# Patient Record
Sex: Male | Born: 1950
Health system: Southern US, Community
[De-identification: ages and names within clinical notes are randomized; demographics above are authoritative.]

## PROBLEM LIST (undated history)

## (undated) DIAGNOSIS — B1920 Unspecified viral hepatitis C without hepatic coma: Secondary | ICD-10-CM

## (undated) DIAGNOSIS — C801 Malignant (primary) neoplasm, unspecified: Secondary | ICD-10-CM

## (undated) DIAGNOSIS — F1020 Alcohol dependence, uncomplicated: Secondary | ICD-10-CM

## (undated) DIAGNOSIS — I10 Essential (primary) hypertension: Secondary | ICD-10-CM

## (undated) DIAGNOSIS — M7540 Impingement syndrome of unspecified shoulder: Secondary | ICD-10-CM

## (undated) HISTORY — PX: STOMACH SURGERY: SHX791

## (undated) HISTORY — DX: Essential (primary) hypertension: I10

## (undated) HISTORY — DX: Unspecified viral hepatitis C without hepatic coma: B19.20

## (undated) HISTORY — DX: Alcohol dependence, uncomplicated: F10.20

## (undated) HISTORY — PX: FRACTURE SURGERY: SHX138

---

## 2002-06-06 DIAGNOSIS — M25819 Other specified joint disorders, unspecified shoulder: Secondary | ICD-10-CM

## 2002-06-06 DIAGNOSIS — M7540 Impingement syndrome of unspecified shoulder: Secondary | ICD-10-CM

## 2002-06-06 HISTORY — DX: Other specified joint disorders, unspecified shoulder: M25.819

## 2002-06-06 HISTORY — DX: Impingement syndrome of unspecified shoulder: M75.40

## 2003-03-31 ENCOUNTER — Encounter: Payer: Self-pay | Admitting: Emergency Medicine

## 2003-03-31 ENCOUNTER — Emergency Department (HOSPITAL_COMMUNITY): Admission: EM | Admit: 2003-03-31 | Discharge: 2003-03-31 | Payer: Self-pay | Admitting: Emergency Medicine

## 2004-02-05 ENCOUNTER — Inpatient Hospital Stay (HOSPITAL_COMMUNITY): Admission: EM | Admit: 2004-02-05 | Discharge: 2004-02-11 | Payer: Self-pay | Admitting: Emergency Medicine

## 2004-10-21 ENCOUNTER — Ambulatory Visit (HOSPITAL_COMMUNITY): Admission: RE | Admit: 2004-10-21 | Discharge: 2004-10-21 | Payer: Self-pay | Admitting: Orthopedic Surgery

## 2004-10-21 ENCOUNTER — Ambulatory Visit (HOSPITAL_BASED_OUTPATIENT_CLINIC_OR_DEPARTMENT_OTHER): Admission: RE | Admit: 2004-10-21 | Discharge: 2004-10-22 | Payer: Self-pay | Admitting: Orthopedic Surgery

## 2005-11-10 ENCOUNTER — Emergency Department (HOSPITAL_COMMUNITY): Admission: EM | Admit: 2005-11-10 | Discharge: 2005-11-10 | Payer: Self-pay | Admitting: Emergency Medicine

## 2010-04-03 ENCOUNTER — Emergency Department (HOSPITAL_COMMUNITY): Admission: EM | Admit: 2010-04-03 | Discharge: 2010-04-03 | Payer: Self-pay | Admitting: Emergency Medicine

## 2010-10-22 NOTE — Op Note (Signed)
Steven [name], Steven [name]                          ACCOUNT NO.:  [id number]   MEDICAL RECORD NO.:  [id number]                   PATIENT TYPE:  INP   LOCATION:  5038                                 FACILITY:  [facility]   PHYSICIAN:  Katy Fitch. Naaman Plummer., M.D.          DATE OF BIRTH:  10/18/1950   DATE OF PROCEDURE:  02/09/2004  DATE OF DISCHARGE:                                 OPERATIVE REPORT   PREOPERATIVE DIAGNOSIS:  Comminuted fracture of the left distal radius,  sustained an 8-feet fall off a roof on February 05, 2004.   INDICATIONS:  Steven [name] was admitted to [facility] emergency room by Dr. Carolynne Edouard  of the trauma service noting a comminuted fracture of the left distal radius  and a chest film revealing fractures of the left eighth and ninth ribs.  He  had considerable chest wall pain and underwent CT scanning of his abdomen.  Imaging studies were negative for significant internal injury.   Steven [name] was admitted to the trauma service and was noted to be febrile  for several days  he has been treated with appropriate pulmonary toilet and  hydration.  He has been afebrile for the past 24 hours and, therefore, we  proceed with elective open reduction and internal fixation of his left  distal radius at this time.   DESCRIPTION OF PROCEDURE:  Steven [name] was brought to the operating room  and placed in the supine position on the operating room table.  Following  anesthesia consultation by Dr. Michelle Piper, general orotracheal anesthesia was  induced.  The left arm was prepped with Betadine solution and sterilely  draped.  A pneumatic tourniquet was applied to the proximal brachium.  Ancef  1 g was administered for IV prophylactic antibiotic followed by  exsanguination of limb with Esmarch with inflation of the arterial  tourniquet to 240 mmHg.  Procedure commenced with a standard DVR incision  parallel to the flexor carpi radialis.  Subcutaneous tissue was carefully  divided taking care to  suture ligate in several transverse veins.  The  fascia overlying the flexor carpi radialis was incised longitudinally  followed by release of the fascia deep to flexor carpi radialis.  Flexor  pollicis longus was retracted in an ulnar direction and pronator quadratus  isolated.   The fracture site was quite distal.  A portion of the volar wrist ligaments  was dissected and the brachial radialis was released deep to the first  dorsal compartment.   A Lane bone clamp was used to rotate the fracture appropriately 90 degrees,  proximal shaft into pronation allowing use of a Freer to disimpact the  dorsal fracture fragments and reduce the articular portion of the fracture.  The fracture was then reduced with three-point molding to an anatomic  position.   A 9-peg DVR plate was placed with provisional fixation with a sliding screw.  This was advanced distally to allow proper placement of the  pegs deep to the  articular surface.   The articular surface was realigned anatomically achieving an ulnar minus  appearance of the wrist suggesting that complete anatomic restoration of  length was achieved.   The distal pegs were replaced in a standard manner utilizing threaded pegs  on the radial aspect of the fracture capturing the radial styloid and smooth  pegs directly below the fourth dorsal compartment.   The AP and lateral and oblique C-arm images were used to control PEG  placement.  The remaining cortical screws were placed in the shaft of the  radius.   The wound was then thoroughly lavaged with sterile saline followed by repair  of the pronator quadratus with mattress suture of 0 Vicryl and repair of the  skin with subdermal sutures of 3-0 Vicryl, 4-0 Vicryl and intradermal 3-0  Prolene.  Marcaine 0.25% was infiltrated for postoperative analgesia  followed by application of a dressing with Betadine ointment, Xeroform,  sterile gauze, sterile Webril, and a sugar-tong splint  maintaining the  forearm in slight pronation.  There were no apparent complications.   Steven [name] was awakened from anesthesia and transferred to the recovery  room with stable vital signs.  The tourniquet time was 60 minutes at 240  mmHg.                                               Katy Fitch Naaman Plummer., M.D.    RVS/MEDQ  D:  02/09/2004  T:  02/09/2004  Job:  [id number]   cc:   Ollen Gross. Vernell Morgans, M.D.  [address]  [place]  [place] [address]  Fax: [phone]

## 2010-10-22 NOTE — Discharge Summary (Signed)
NAMELUCILE, DIDONATO                          ACCOUNT NO.:  [id number]   MEDICAL RECORD NO.:  [id number]                   PATIENT TYPE:  INP   LOCATION:  5038                                 FACILITY:  [facility]   PHYSICIAN:  Jimmye Norman, M.D.                   DATE OF BIRTH:  07-30-1950   DATE OF ADMISSION:  02/04/2004  DATE OF DISCHARGE:  02/11/2004                                 DISCHARGE SUMMARY   ADMITTING TRAUMA SURGEON:  Ollen Gross. Carolynne Edouard, M.D.   CONSULTATIONS:  Dr. Katy Fitch. Sypher, hand surgery.   DISCHARGE DIAGNOSES:  1.  Fall from a roof approximately eight feet.  2.  Left rib fractures, left lateral ribs number eight and nine.  3.  Comminuted fracture, left distal radius.  4.  History of ETOH use.  5.  History of tobacco abuse.   HISTORY OF PRESENT ILLNESS:  This is a 60 year old Caucasian male who fell  approximately eight feet from a roof.  He suffered left rib fractures x2,  and a comminuted left distal radius fracture.  He had no other injuries.  He  was admitted for pain control and nebulization.   HOSPITAL COURSE:  He underwent an evaluation by hand surgery for his distal  radius fracture, and it was felt that surgical intervention was required,  and the patient underwent an ORIF of his left comminuted distal radius  fracture utilizing a DVR plate per Dr. Teressa Senter without complications.  This  was a closed fracture.   DISPOSITION:  The patient is stable upon discharge and went home the  following day.   DISCHARGE MEDICATIONS:  1.  Motrin 600 mg, one p.o. q.6h. p.r.n. pain.  2.  Dilaudid 2 mg, one to two p.o. q.4h. p.r.n. pain.   FOLLOWUP:  The patient will follow up with Dr. Teressa Senter next Monday on  February 16, 2004.  He will follow up with the trauma service on February 17, 2004, or sooner should he have difficulties in the interim.      Shawn Rayburn, P.A.                       Jimmye Norman, M.D.    SR/MEDQ  D:  02/11/2004  T:  02/11/2004  Job:  [id number]

## 2010-10-22 NOTE — Op Note (Signed)
NAMEJOMAR, [name]             ACCOUNT NO.:  [id number]   MEDICAL RECORD NO.:  [id number]          PATIENT TYPE:  AMB   LOCATION:  DSC                          FACILITY:  [facility]   PHYSICIAN:  Katy Fitch. Sypher, M.D. DATE OF BIRTH:  March 27, 1941   DATE OF PROCEDURE:  10/21/2004  DATE OF DISCHARGE:                                 OPERATIVE REPORT   PREOPERATIVE DIAGNOSES:  1.  Chronic left shoulder AC degenerative arthritis with stage II      impingement.  2.  Rule out partial or full-thickness rotator cuff tear.   POSTOP DIAGNOSIS:  Mild adhesive capsulitis left shoulder with AC  degenerative arthritis and chronic stage II impingement with bursitis and  significant labral degenerative changes from 2 o'clock anteriorly to 10  o'clock posteriorly.   OPERATION:  1.  Diagnostic arthroscopy, left glenohumeral joint with labral debridement.  2.  Arthroscopic subacromial decompression with acromioplasty,      coracoacromial ligament release and bursectomy.  3.  Open distal clavicle resection.   OPERATING SURGEON:  Katy Fitch. Sypher, M.D.   ASSISTANT:  Molly Maduro Dasnoit PA-C.   ANESTHESIA:  General by endotracheal technique supplemented by a left  interscalene block.   SUPERVISING ANESTHESIOLOGIST:  Dr. Michelle Piper.   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 He sustained a left chest contusion, a left  shoulder injury and a fracture of the left wrist.   He is status post open reduction internal fixation of his wrist fracture. He  subsequently went on to heal his wrist fracture but had persistent  difficulties with left shoulder pain. Clinical examination suggested he had  probably sustained a partial separation of his left shoulder and subsequent  AC degenerative arthritis changes and signs of impingement.   Due to failure to respond to more than eight months of observation. He is  now brought to the operating room this  time after an MRI documented AC  arthritis and signs of chronic subacromial bursitis and rotator cuff  tendinopathy.   Preoperatively he was advised we anticipated subacromial decompression and  distal clavicle resection. We advised him we would repair his rotator cuff  should we find significant pathology.   PROCEDURE:  [name] is brought to the operating room and placed in  supine position upon the operating table.   Following the induction of general anesthesia, he was carefully positioned  in the beach-chair position with the of a torso and head holder designed for  shoulder arthroscopy.   The entire left upper extremity and forequarter were prepped were prepped  with DuraPrep and draped with impervious arthroscopy drapes.   The shoulder was distended with 20 cc of sterile saline through an anterior  approach with a spinal needle followed by placement of the scope through a  standard posterior viewing portal.   Diagnostic arthroscopy revealed intact hyaline articular cartilage surfaces  on the glenoid and humeral head. The superior and inferior recesses were  normal. The biceps origin was stable; however, the superior labrum was quite  degenerative and frayed with  a type 1 SLAP lesion identified.   An anterior portal was created under direct vision followed by use of a 4.5-  mm suction shaver to thoroughly debride the labrum. The anterior capsular  ligaments were obscured partially by some adhesive capsulitis tissues. This  was debrided with a suction shaver and hemostasis achieved with ArthroCare  Turbovac wand.   The deep surface rotator cuff was inspected, found be normal.   There no signs of instability.   The scope was removed from the glenohumeral joint and placed in the  subacromial space. A florid bursitis was noted.   After bursectomy, the coracoacromial ligament released with the cutting  cautery followed by release the capsule of AC joint. The [facility] joint  revealed  marked degenerative changes on the distal clavicle.   The acromion was leveled to a type 1 morphology. After thorough bursectomy,  the cuff was inspected and found be intact.   The distal clavicle was then not mobile enough to facilitate easy  arthroscopic removal. Therefore we proceeded to open resection of distal  clavicle with a 2 cm incision centered over the distal clavicle followed by  subperiosteal dissection of the distal clavicle with release of the  ligaments at the [facility] joint. The distal 15 mm clavicle was removed with the  oscillating saw and osteotome and rongeur. The dead space created by distal  clavicle resection was repaired with mattress suture of #2 FiberWire x2.   The wound was then repaired with subdermal sutures of 3-0 Vicryl and  intradermal 3-0 Prolene.   The scope was then placed in the subacromial space and hemostasis achieved  followed by photographic documentation of the distal clavicle resection.   There were no apparent complications.   Mr. Ostrow tolerated surgery and anesthesia well. He was transferred to  recovery room with stable signs.   We will admit him to recovery care center for observation of his vital signs  and prophylactic antibiotic in the form of Ancef 1 gram IV q.8 h. He will be  discharged 24 hours. We anticipate use of Dilaudid 2 milligrams 1 or 2  tablets p.o.  q.4 to 6 hours p.r.n. pain, 30 tablets with no refills, Motrin  6 milligrams one p.o. q.6 h p.r.n. pain 30 tablets with one refill and  Keflex 500 milligrams one p.o. q.8 h x 4 days as a prophylactic antibiotic.      RVS/MEDQ  D:  10/21/2004  T:  10/21/2004  Job:  [id number]

## 2011-06-07 HISTORY — PX: SKIN CANCER EXCISION: SHX779

## 2012-06-05 ENCOUNTER — Telehealth: Payer: Self-pay

## 2012-06-05 NOTE — Telephone Encounter (Signed)
   Gastroenterology Pre-Procedure Form  Pt referred by Dr. Felecia Shelling not sure if he had colonoscopy years ago in [place]/ not having any problems and not on any meds   Request Date: 06/05/2012  ,  Requesting Physician: Dr. Felecia Shelling     PATIENT INFORMATION:  Steven [name] is a 61 y.o., male (DOB=[birth year]).  PROCEDURE: Procedure(s) requested: colonoscopy Procedure Reason: screening for colon cancer  PATIENT REVIEW QUESTIONS: The patient reports the following:   1. Diabetes Melitis: no 2. Joint replacements in the past 12 months: no 3. Major health problems in the past 3 months: no 4. Has an artificial valve or MVP:no 5. Has been advised in past to take antibiotics in advance of a procedure like teeth cleaning: no}    MEDICATIONS & ALLERGIES:    Patient reports the following regarding taking any blood thinners:   Plavix? no Aspirin?no Coumadin?  no  Patient confirms/reports the following medications:  No current outpatient prescriptions on file.    Patient confirms/reports the following allergies:  No Known Allergies  Patient is appropriate to schedule for requested procedure(s): yes  AUTHORIZATION INFORMATION Primary Insurance:   ID #:  Group #:  Pre-Cert / Auth required: Pre-Cert / Auth #:   Secondary Insurance:   ID #:   Group #:  Pre-Cert / Auth required:  Pre-Cert / Auth #:   No orders of the defined types were placed in this encounter.    SCHEDULE INFORMATION: Procedure has been scheduled as follows:  Date: 06/22/2012    Time: 8:30 AM  Location: [facility]  This Gastroenterology Pre-Precedure Form is being routed to the following provider(s) for review: Jonette Eva, MD

## 2012-06-07 MED ORDER — PEG-KCL-NACL-NASULF-NA ASC-C 100 G PO SOLR: 1.0000 | ORAL | Status: DC

## 2012-06-07 NOTE — Telephone Encounter (Signed)
Rx sent to [facility] in [place]. Instructions mailed to pt.  

## 2012-06-07 NOTE — Telephone Encounter (Signed)
MOVI PREP SPLIT DOSING, REGULAR BREAKFAST. CLEAR LIQUIDS AFTER 9 AM.  

## 2012-06-21 ENCOUNTER — Telehealth: Payer: Self-pay

## 2012-06-21 NOTE — Telephone Encounter (Signed)
REVIEWED.  

## 2012-06-21 NOTE — Telephone Encounter (Addendum)
Pt called to cancel colonoscopy tomorrow. York Spaniel he has the flu. He wants to wait a couple of months and he also wants to make sure that Medicaid will pay for it. I took him off of our schedule. But looks like I had failed to put the order in, so I did not have to cancel orders. I will have him on my reminder list to check on in a couple of months.

## 2012-10-17 ENCOUNTER — Telehealth: Payer: Self-pay

## 2012-10-17 NOTE — Telephone Encounter (Signed)
Spoke to pt and he said he does want to schedule soon. He is busy at the moment and will call when he is ready.

## 2012-11-02 ENCOUNTER — Other Ambulatory Visit: Payer: Self-pay | Admitting: Radiology

## 2012-11-05 ENCOUNTER — Encounter (HOSPITAL_COMMUNITY): Payer: Self-pay | Admitting: Pharmacy Technician

## 2012-11-16 ENCOUNTER — Encounter (HOSPITAL_COMMUNITY): Payer: Self-pay

## 2012-11-16 ENCOUNTER — Other Ambulatory Visit: Payer: Self-pay

## 2012-11-16 ENCOUNTER — Encounter (HOSPITAL_COMMUNITY)
Admission: RE | Admit: 2012-11-16 | Discharge: 2012-11-16 | Disposition: A | Discharge status: Home / Self Care | Payer: Medicare Other | Source: Ambulatory Visit | Attending: Orthopaedic Surgery | Admitting: Orthopaedic Surgery

## 2012-11-16 HISTORY — DX: Impingement syndrome of unspecified shoulder: M75.40

## 2012-11-16 LAB — URINE MICROSCOPIC-ADD ON

## 2012-11-16 LAB — CBC WITH DIFFERENTIAL/PLATELET
Basophils Absolute: 0.1 10*3/uL (ref 0.0–0.1)
Basophils Relative: 1 % (ref 0–1)
Eosinophils Absolute: 0.2 10*3/uL (ref 0.0–0.7)
Eosinophils Relative: 3 % (ref 0–5)
HCT: 50.8 % (ref 39.0–52.0)
Hemoglobin: 17.7 g/dL — ABNORMAL HIGH (ref 13.0–17.0)
Lymphocytes Relative: 20 % (ref 12–46)
Lymphs Abs: 1.7 10*3/uL (ref 0.7–4.0)
MCH: 33.1 pg (ref 26.0–34.0)
MCHC: 34.8 g/dL (ref 30.0–36.0)
MCV: 95 fL (ref 78.0–100.0)
Monocytes Absolute: 0.6 10*3/uL (ref 0.1–1.0)
Monocytes Relative: 7 % (ref 3–12)
Neutro Abs: 6.3 10*3/uL (ref 1.7–7.7)
Neutrophils Relative %: 70 % (ref 43–77)
Platelets: 183 10*3/uL (ref 150–400)
RBC: 5.35 MIL/uL (ref 4.22–5.81)
RDW: 13.1 % (ref 11.5–15.5)
WBC: 8.9 10*3/uL (ref 4.0–10.5)

## 2012-11-16 LAB — COMPREHENSIVE METABOLIC PANEL
ALT: 45 U/L (ref 0–53)
AST: 28 U/L (ref 0–37)
Albumin: 4 g/dL (ref 3.5–5.2)
Alkaline Phosphatase: 81 U/L (ref 39–117)
BUN: 14 mg/dL (ref 6–23)
CO2: 28 mEq/L (ref 19–32)
Calcium: 9.6 mg/dL (ref 8.4–10.5)
Chloride: 99 mEq/L (ref 96–112)
Creatinine, Ser: 0.73 mg/dL (ref 0.50–1.35)
GFR calc Af Amer: >90 mL/min (ref >90)
GFR calc non Af Amer: >90 mL/min (ref >90)
Glucose, Bld: 174 mg/dL — ABNORMAL HIGH (ref 70–99)
Potassium: 4.5 mEq/L (ref 3.5–5.1)
Sodium: 137 mEq/L (ref 135–145)
Total Bilirubin: 0.6 mg/dL (ref 0.3–1.2)
Total Protein: 7.4 g/dL (ref 6.0–8.3)

## 2012-11-16 LAB — URINALYSIS, ROUTINE W REFLEX MICROSCOPIC
Bilirubin Urine: NEGATIVE
Glucose, UA: NEGATIVE mg/dL
Ketones, ur: NEGATIVE mg/dL
Leukocytes, UA: NEGATIVE
Nitrite: NEGATIVE
Protein, ur: NEGATIVE mg/dL
Specific Gravity, Urine: 1.02 (ref 1.005–1.030)
Urobilinogen, UA: 1 mg/dL (ref 0.0–1.0)
pH: 6 (ref 5.0–8.0)

## 2012-11-16 LAB — RAPID URINE DRUG SCREEN, HOSP PERFORMED
Amphetamines: NOT DETECTED
Barbiturates: NOT DETECTED
Benzodiazepines: NOT DETECTED
Cocaine: NOT DETECTED
Opiates: POSITIVE — AB
Tetrahydrocannabinol: POSITIVE — AB

## 2012-11-16 LAB — SURGICAL PCR SCREEN
MRSA, PCR: NEGATIVE
Staphylococcus aureus: NEGATIVE

## 2012-11-16 NOTE — Patient Instructions (Signed)
Steven [name]  11/16/2012   Your procedure is scheduled on:  11/20/12  Report to [facility] at [place] [facility].  Call this number if you have problems the morning of surgery: [phone]   Remember:   Do not eat food or drink liquids after midnight.   Take these medicines the morning of surgery with A SIP OF WATER: vicodin   Do not wear jewelry, make-up or nail polish.  Do not wear lotions, powders, or perfumes. You may wear deodorant.  Do not shave 48 hours prior to surgery. Men may shave face and neck.  Do not bring valuables to the hospital.  [facility] is not responsible                   for any belongings or valuables.  Contacts, dentures or bridgework may not be worn into surgery.  Leave suitcase in the car. After surgery it may be brought to your room.  For patients admitted to the hospital, checkout time is 11:00 AM the day of  discharge.   Patients discharged the day of surgery will not be allowed to drive  home.  Name and phone number of your driver: family  Special Instructions: Shower using CHG 2 nights before surgery and the night before surgery.  If you shower the day of surgery use CHG.  Use special wash - you have one bottle of CHG for all showers.  You should use approximately 1/3 of the bottle for each shower.   Please read over the following fact sheets that you were given: Pain Booklet, Coughing and Deep Breathing, MRSA Information, Surgical Site Infection Prevention, Anesthesia Post-op Instructions and Care and Recovery After Surgery   PATIENT INSTRUCTIONS POST-ANESTHESIA  IMMEDIATELY FOLLOWING SURGERY:  Do not drive or operate machinery for the first twenty four hours after surgery.  Do not make any important decisions for twenty four hours after surgery or while taking narcotic pain medications or sedatives.  If you develop intractable nausea and vomiting or a severe headache please notify your doctor immediately.  FOLLOW-UP:  Please make an appointment with your  surgeon as instructed. You do not need to follow up with anesthesia unless specifically instructed to do so.  WOUND CARE INSTRUCTIONS (if applicable):  Keep a dry clean dressing on the anesthesia/puncture wound site if there is drainage.  Once the wound has quit draining you may leave it open to air.  Generally you should leave the bandage intact for twenty four hours unless there is drainage.  If the epidural site drains for more than 36-48 hours please call the anesthesia department.  QUESTIONS?:  Please feel free to call your physician or the hospital operator if you have any questions, and they will be happy to assist you.      Impingement Syndrome, Rotator Cuff, Bursitis with Rehab Impingement syndrome is a condition that involves inflammation of the tendons of the rotator cuff and the subacromial bursa, that causes pain in the shoulder. The rotator cuff consists of four tendons and muscles that control much of the shoulder and upper arm function. The subacromial bursa is a fluid filled sac that helps reduce friction between the rotator cuff and one of the bones of the shoulder (acromion). Impingement syndrome is usually an overuse injury that causes swelling of the bursa (bursitis), swelling of the tendon (tendonitis), and/or a tear of the tendon (strain). Strains are classified into three categories. Grade 1 strains cause pain, but the tendon is not lengthened. Grade  2 strains include a lengthened ligament, due to the ligament being stretched or partially ruptured. With grade 2 strains there is still function, although the function may be decreased. Grade 3 strains include a complete tear of the tendon or muscle, and function is usually impaired. SYMPTOMS   Pain around the shoulder, often at the outer portion of the upper arm.  Pain that gets worse with shoulder function, especially when reaching overhead or lifting.  Sometimes, aching when not using the arm.  Pain that wakes you up at  night.  Sometimes, tenderness, swelling, warmth, or redness over the affected area.  Loss of strength.  Limited motion of the shoulder, especially reaching behind the back (to the back pocket or to unhook bra) or across your body.  Crackling sound (crepitation) when moving the arm.  Biceps tendon pain and inflammation (in the front of the shoulder). Worse when bending the elbow or lifting. CAUSES  Impingement syndrome is often an overuse injury, in which chronic (repetitive) motions cause the tendons or bursa to become inflamed. A strain occurs when a force is paced on the tendon or muscle that is greater than it can withstand. Common mechanisms of injury include: Stress from sudden increase in duration, frequency, or intensity of training.  Direct hit (trauma) to the shoulder.  Aging, erosion of the tendon with normal use.  Bony bump on shoulder (acromial spur). RISK INCREASES WITH:  Contact sports (football, wrestling, boxing).  Throwing sports (baseball, tennis, volleyball).  Weightlifting and bodybuilding.  Heavy labor.  Previous injury to the rotator cuff, including impingement.  Poor shoulder strength and flexibility.  Failure to warm up properly before activity.  Inadequate protective equipment.  Old age.  Bony bump on shoulder (acromial spur). PREVENTION   Warm up and stretch properly before activity.  Allow for adequate recovery between workouts.  Maintain physical fitness:  Strength, flexibility, and endurance.  Cardiovascular fitness.  Learn and use proper exercise technique. PROGNOSIS  If treated properly, impingement syndrome usually goes away within 6 weeks. Sometimes surgery is required.  RELATED COMPLICATIONS   Longer healing time if not properly treated, or if not given enough time to heal.  Recurring symptoms, that result in a chronic condition.  Shoulder stiffness, frozen shoulder, or loss of motion.  Rotator cuff tendon  tear.  Recurring symptoms, especially if activity is resumed too soon, with overuse, with a direct blow, or when using poor technique. TREATMENT  Treatment first involves the use of ice and medicine, to reduce pain and inflammation. The use of strengthening and stretching exercises may help reduce pain with activity. These exercises may be performed at home or with a therapist. If non-surgical treatment is unsuccessful after more than 6 months, surgery may be advised. After surgery and rehabilitation, activity is usually possible in 3 months.  MEDICATION  If pain medicine is needed, nonsteroidal anti-inflammatory medicines (aspirin and ibuprofen), or other minor pain relievers (acetaminophen), are often advised.  Do not take pain medicine for 7 days before surgery.  Prescription pain relievers may be given, if your caregiver thinks they are needed. Use only as directed and only as much as you need.  Corticosteroid injections may be given by your caregiver. These injections should be reserved for the most serious cases, because they may only be given a certain number of times. HEAT AND COLD  Cold treatment (icing) should be applied for 10 to 15 minutes every 2 to 3 hours for inflammation and pain, and immediately after activity that aggravates  your symptoms. Use ice packs or an ice massage.  Heat treatment may be used before performing stretching and strengthening activities prescribed by your caregiver, physical therapist, or athletic trainer. Use a heat pack or a warm water soak. SEEK MEDICAL CARE IF:   Symptoms get worse or do not improve in 4 to 6 weeks, despite treatment.  New, unexplained symptoms develop. (Drugs used in treatment may produce side effects.) EXERCISES  RANGE OF MOTION (ROM) AND STRETCHING EXERCISES - Impingement Syndrome (Rotator Cuff  Tendinitis, Bursitis) These exercises may help you when beginning to rehabilitate your injury. Your symptoms may go away with or  without further involvement from your physician, physical therapist or athletic trainer. While completing these exercises, remember:   Restoring tissue flexibility helps normal motion to return to the joints. This allows healthier, less painful movement and activity.  An effective stretch should be held for at least 30 seconds.  A stretch should never be painful. You should only feel a gentle lengthening or release in the stretched tissue. STRETCH  Flexion, Standing  Stand with good posture. With an underhand grip on your right / left hand, and an overhand grip on the opposite hand, grasp a broomstick or cane so that your hands are a little more than shoulder width apart.  Keeping your right / left elbow straight and shoulder muscles relaxed, push the stick with your opposite hand, to raise your right / left arm in front of your body and then overhead. Raise your arm until you feel a stretch in your right / left shoulder, but before you have increased shoulder pain.  Try to avoid shrugging your right / left shoulder as your arm rises, by keeping your shoulder blade tucked down and toward your mid-back spine. Hold for __________ seconds.  Slowly return to the starting position. Repeat __________ times. Complete this exercise __________ times per day. STRETCH  Abduction, Supine  Lie on your back. With an underhand grip on your right / left hand and an overhand grip on the opposite hand, grasp a broomstick or cane so that your hands are a little more than shoulder width apart.  Keeping your right / left elbow straight and your shoulder muscles relaxed, push the stick with your opposite hand, to raise your right / left arm out to the side of your body and then overhead. Raise your arm until you feel a stretch in your right / left shoulder, but before you have increased shoulder pain.  Try to avoid shrugging your right / left shoulder as your arm rises, by keeping your shoulder blade tucked down  and toward your mid-back spine. Hold for __________ seconds.  Slowly return to the starting position. Repeat __________ times. Complete this exercise __________ times per day. ROM  Flexion, Active-Assisted  Lie on your back. You may bend your knees for comfort.  Grasp a broomstick or cane so your hands are about shoulder width apart. Your right / left hand should grip the end of the stick, so that your hand is positioned "thumbs-up," as if you were about to shake hands.  Using your healthy arm to lead, raise your right / left arm overhead, until you feel a gentle stretch in your shoulder. Hold for __________ seconds.  Use the stick to assist in returning your right / left arm to its starting position. Repeat __________ times. Complete this exercise __________ times per day.  ROM - Internal Rotation, Supine   Lie on your back on a firm surface.  Place your right / left elbow about 60 degrees away from your side. Elevate your elbow with a folded towel, so that the elbow and shoulder are the same height.  Using a broomstick or cane and your strong arm, pull your right / left hand toward your body until you feel a gentle stretch, but no increase in your shoulder pain. Keep your shoulder and elbow in place throughout the exercise.  Hold for __________ seconds. Slowly return to the starting position. Repeat __________ times. Complete this exercise __________ times per day. STRETCH - Internal Rotation  Place your right / left hand behind your back, palm up.  Throw a towel or belt over your opposite shoulder. Grasp the towel with your right / left hand.  While keeping an upright posture, gently pull up on the towel, until you feel a stretch in the front of your right / left shoulder.  Avoid shrugging your right / left shoulder as your arm rises, by keeping your shoulder blade tucked down and toward your mid-back spine.  Hold for __________ seconds. Release the stretch, by lowering your healthy  hand. Repeat __________ times. Complete this exercise __________ times per day. ROM - Internal Rotation   Using an underhand grip, grasp a stick behind your back with both hands.  While standing upright with good posture, slide the stick up your back until you feel a mild stretch in the front of your shoulder.  Hold for __________ seconds. Slowly return to your starting position. Repeat __________ times. Complete this exercise __________ times per day.  STRETCH  Posterior Shoulder Capsule   Stand or sit with good posture. Grasp your right / left elbow and draw it across your chest, keeping it at the same height as your shoulder.  Pull your elbow, so your upper arm comes in closer to your chest. Pull until you feel a gentle stretch in the back of your shoulder.  Hold for __________ seconds. Repeat __________ times. Complete this exercise __________ times per day. STRENGTHENING EXERCISES - Impingement Syndrome (Rotator Cuff Tendinitis, Bursitis) These exercises may help you when beginning to rehabilitate your injury. They may resolve your symptoms with or without further involvement from your physician, physical therapist or athletic trainer. While completing these exercises, remember:  Muscles can gain both the endurance and the strength needed for everyday activities through controlled exercises.  Complete these exercises as instructed by your physician, physical therapist or athletic trainer. Increase the resistance and repetitions only as guided.  You may experience muscle soreness or fatigue, but the pain or discomfort you are trying to eliminate should never worsen during these exercises. If this pain does get worse, stop and make sure you are following the directions exactly. If the pain is still present after adjustments, discontinue the exercise until you can discuss the trouble with your clinician.  During your recovery, avoid activity or exercises which involve actions that place  your injured hand or elbow above your head or behind your back or head. These positions stress the tissues which you are trying to heal. STRENGTH - Scapular Depression and Adduction   With good posture, sit on a firm chair. Support your arms in front of you, with pillows, arm rests, or on a table top. Have your elbows in line with the sides of your body.  Gently draw your shoulder blades down and toward your mid-back spine. Gradually increase the tension, without tensing the muscles along the top of your shoulders and the back of your neck.  Hold for __________ seconds. Slowly release the tension and relax your muscles completely before starting the next repetition.  After you have practiced this exercise, remove the arm support and complete the exercise in standing as well as sitting position. Repeat __________ times. Complete this exercise __________ times per day.  STRENGTH - Shoulder Abductors, Isometric  With good posture, stand or sit about 4-6 inches from a wall, with your right / left side facing the wall.  Bend your right / left elbow. Gently press your right / left elbow into the wall. Increase the pressure gradually, until you are pressing as hard as you can, without shrugging your shoulder or increasing any shoulder discomfort.  Hold for __________ seconds.  Release the tension slowly. Relax your shoulder muscles completely before you begin the next repetition. Repeat __________ times. Complete this exercise __________ times per day.  STRENGTH - External Rotators, Isometric  Keep your right / left elbow at your side and bend it 90 degrees.  Step into a door frame so that the outside of your right / left wrist can press against the door frame without your upper arm leaving your side.  Gently press your right / left wrist into the door frame, as if you were trying to swing the back of your hand away from your stomach. Gradually increase the tension, until you are pressing as hard  as you can, without shrugging your shoulder or increasing any shoulder discomfort.  Hold for __________ seconds.  Release the tension slowly. Relax your shoulder muscles completely before you begin the next repetition. Repeat __________ times. Complete this exercise __________ times per day.  STRENGTH - Supraspinatus   Stand or sit with good posture. Grasp a __________ weight, or an exercise band or tubing, so that your hand is "thumbs-up," like you are shaking hands.  Slowly lift your right / left arm in a "V" away from your thigh, diagonally into the space between your side and straight ahead. Lift your hand to shoulder height or as far as you can, without increasing any shoulder pain. At first, many people do not lift their hands above shoulder height.  Avoid shrugging your right / left shoulder as your arm rises, by keeping your shoulder blade tucked down and toward your mid-back spine.  Hold for __________ seconds. Control the descent of your hand, as you slowly return to your starting position. Repeat __________ times. Complete this exercise __________ times per day.  STRENGTH - External Rotators  Secure a rubber exercise band or tubing to a fixed object (table, pole) so that it is at the same height as your right / left elbow when you are standing or sitting on a firm surface.  Stand or sit so that the secured exercise band is at your uninjured side.  Bend your right / left elbow 90 degrees. Place a folded towel or small pillow under your right / left arm, so that your elbow is a few inches away from your side.  Keeping the tension on the exercise band, pull it away from your body, as if pivoting on your elbow. Be sure to keep your body steady, so that the movement is coming only from your rotating shoulder.  Hold for __________ seconds. Release the tension in a controlled manner, as you return to the starting position. Repeat __________ times. Complete this exercise __________  times per day.  STRENGTH - Internal Rotators   Secure a rubber exercise band or tubing to a fixed object (table, pole)  so that it is at the same height as your right / left elbow when you are standing or sitting on a firm surface.  Stand or sit so that the secured exercise band is at your right / left side.  Bend your elbow 90 degrees. Place a folded towel or small pillow under your right / left arm so that your elbow is a few inches away from your side.  Keeping the tension on the exercise band, pull it across your body, toward your stomach. Be sure to keep your body steady, so that the movement is coming only from your rotating shoulder.  Hold for __________ seconds. Release the tension in a controlled manner, as you return to the starting position. Repeat __________ times. Complete this exercise __________ times per day.  STRENGTH - Scapular Protractors, Standing   Stand arms length away from a wall. Place your hands on the wall, keeping your elbows straight.  Begin by dropping your shoulder blades down and toward your mid-back spine.  To strengthen your protractors, keep your shoulder blades down, but slide them forward on your rib cage. It will feel as if you are lifting the back of your rib cage away from the wall. This is a subtle motion and can be challenging to complete. Ask your caregiver for further instruction, if you are not sure you are doing the exercise correctly.  Hold for __________ seconds. Slowly return to the starting position, resting the muscles completely before starting the next repetition. Repeat __________ times. Complete this exercise __________ times per day. STRENGTH - Scapular Protractors, Supine  Lie on your back on a firm surface. Extend your right / left arm straight into the air while holding a __________ weight in your hand.  Keeping your head and back in place, lift your shoulder off the floor.  Hold for __________ seconds. Slowly return to the  starting position, and allow your muscles to relax completely before starting the next repetition. Repeat __________ times. Complete this exercise __________ times per day. STRENGTH - Scapular Protractors, Quadruped  Get onto your hands and knees, with your shoulders directly over your hands (or as close as you can be, comfortably).  Keeping your elbows locked, lift the back of your rib cage up into your shoulder blades, so your mid-back rounds out. Keep your neck muscles relaxed.  Hold this position for __________ seconds. Slowly return to the starting position and allow your muscles to relax completely before starting the next repetition. Repeat __________ times. Complete this exercise __________ times per day.  STRENGTH - Scapular Retractors  Secure a rubber exercise band or tubing to a fixed object (table, pole), so that it is at the height of your shoulders when you are either standing, or sitting on a firm armless chair.  With a palm down grip, grasp an end of the band in each hand. Straighten your elbows and lift your hands straight in front of you, at shoulder height. Step back, away from the secured end of the band, until it becomes tense.  Squeezing your shoulder blades together, draw your elbows back toward your sides, as you bend them. Keep your upper arms lifted away from your body throughout the exercise.  Hold for __________ seconds. Slowly ease the tension on the band, as you reverse the directions and return to the starting position. Repeat __________ times. Complete this exercise __________ times per day. STRENGTH - Shoulder Extensors   Secure a rubber exercise band or tubing to a fixed object (  table, pole) so that it is at the height of your shoulders when you are either standing, or sitting on a firm armless chair.  With a thumbs-up grip, grasp an end of the band in each hand. Straighten your elbows and lift your hands straight in front of you, at shoulder height. Step  back, away from the secured end of the band, until it becomes tense.  Squeezing your shoulder blades together, pull your hands down to the sides of your thighs. Do not allow your hands to go behind you.  Hold for __________ seconds. Slowly ease the tension on the band, as you reverse the directions and return to the starting position. Repeat __________ times. Complete this exercise __________ times per day.  STRENGTH - Scapular Retractors and External Rotators   Secure a rubber exercise band or tubing to a fixed object (table, pole) so that it is at the height as your shoulders, when you are either standing, or sitting on a firm armless chair.  With a palm down grip, grasp an end of the band in each hand. Bend your elbows 90 degrees and lift your elbows to shoulder height, at your sides. Step back, away from the secured end of the band, until it becomes tense.  Squeezing your shoulder blades together, rotate your shoulders so that your upper arms and elbows remain stationary, but your fists travel upward to head height.  Hold for __________ seconds. Slowly ease the tension on the band, as you reverse the directions and return to the starting position. Repeat __________ times. Complete this exercise __________ times per day.  STRENGTH - Scapular Retractors and External Rotators, Rowing   Secure a rubber exercise band or tubing to a fixed object (table, pole) so that it is at the height of your shoulders, when you are either standing, or sitting on a firm armless chair.  With a palm down grip, grasp an end of the band in each hand. Straighten your elbows and lift your hands straight in front of you, at shoulder height. Step back, away from the secured end of the band, until it becomes tense.  Step 1: Squeeze your shoulder blades together. Bending your elbows, draw your hands to your chest, as if you are rowing a boat. At the end of this motion, your hands and elbow should be at shoulder height  and your elbows should be out to your sides.  Step 2: Rotate your shoulders, to raise your hands above your head. Your forearms should be vertical and your upper arms should be horizontal.  Hold for __________ seconds. Slowly ease the tension on the band, as you reverse the directions and return to the starting position. Repeat __________ times. Complete this exercise __________ times per day.  STRENGTH  Scapular Depressors  Find a sturdy chair without wheels, such as a dining room chair.  Keeping your feet on the floor, and your hands on the chair arms, lift your bottom up from the seat, and lock your elbows.  Keeping your elbows straight, allow gravity to pull your body weight down. Your shoulders will rise toward your ears.  Raise your body against gravity by drawing your shoulder blades down your back, shortening the distance between your shoulders and ears. Although your feet should always maintain contact with the floor, your feet should progressively support less body weight, as you get stronger.  Hold for __________ seconds. In a controlled and slow manner, lower your body weight to begin the next repetition. Repeat __________  times. Complete this exercise __________ times per day.  Document Released: 05/23/2005 Document Revised: 08/15/2011 Document Reviewed: 09/04/2008 [facility] Patient Information 2014 [place], [place].

## 2012-11-19 NOTE — Consult Note (Signed)
Reason for Consult:Right shoulder pain Referring Physician: Jeromey Kruer is an 62 y.o. male.  HPI: He has a ten month or so history of right shoulder pain.  He has no trauma.  He has pain with overhead use.  He has no numbness.  He has pain rolling over on it at night.  I first saw him in the office 06/08/12.  He has tried physical therapy, rubs, injections with no help.  He cannot take any NSAID as he has had three GI bleeds in the past.  He had a MRI at an open unit in [place] on Oct 05, 2012 showing no rotator cuff tear but signs of impingement.  He says he is tired of hurting.  The exercises have not helped.  He would like surgery.  He has been told of the risks and imponderables.  He asked appropriate questions.  He understands that he may still has some slight pain and decreased function as he cannot take any NSAID and may have "scar" tissue to develop.  He will need therapy post operative.  He will be admitted overnight for pain control.  He agrees to the procedure.  Past Medical History  Diagnosis Date  . Shoulder impingement 2004    Past Surgical History  Procedure Laterality Date  . Skin cancer excision  2013    nose    No family history on file.  Social History:  reports that he has been smoking Cigars.  He does not have any smokeless tobacco history on file. He reports that he uses illicit drugs (Marijuana) about 7 times per week. He reports that he does not drink alcohol.  Allergies: No Known Allergies  Medications: I have reviewed the patient's current medications.  No results found for this or any previous visit (from the past 48 hour(s)).  No results found.  Review of Systems  Gastrointestinal:       History of three GI bleeds.  Unable to take NSAIDs.  Musculoskeletal: Positive for joint pain (He has a 10 month or so history of right shoulder pain, not relieved with treatment.  He has no trauma.  He has no numbness.).  All other systems reviewed and are  negative.   There were no vitals taken for this visit. Physical Exam  Constitutional: He appears well-developed and well-nourished.  HENT:  Head: Normocephalic and atraumatic.  Eyes: Conjunctivae and EOM are normal. Pupils are equal, round, and reactive to light.  Neck: Normal range of motion. Neck supple.  Cardiovascular: Normal rate, regular rhythm, normal heart sounds and intact distal pulses.   Respiratory: Effort normal and breath sounds normal.  GI: Soft. Bowel sounds are normal.  Musculoskeletal: He exhibits tenderness (He has pain in ROM of the right shoulder, more with overhead positioning and abduction.  He has slight crepitus.  The shoulder is stable.  He has no effusion or numbness.  NV is intact.).       Right shoulder: He exhibits tenderness, crepitus and pain.  Neurological: He is alert. He has normal reflexes.  Skin: Skin is warm and dry.  Psychiatric: He has a normal mood and affect. His behavior is normal. Judgment and thought content normal.    Assessment/Plan: Impingement syndrome of the right shoulder, no rotator cuff tear.  For acromioplasty.  He will have overnight admission to observation status.  Physical therapy will be arranged post operative.  Steven [name] 11/19/2012, 2:47 PM

## 2012-11-19 NOTE — Progress Notes (Signed)
I put the H&P in as a consult note, it should be a H&P.

## 2012-11-20 ENCOUNTER — Encounter (HOSPITAL_COMMUNITY): Payer: Self-pay | Admitting: *Deleted

## 2012-11-20 ENCOUNTER — Ambulatory Visit (HOSPITAL_COMMUNITY): Payer: Medicare Other | Admitting: Anesthesiology

## 2012-11-20 ENCOUNTER — Observation Stay (HOSPITAL_COMMUNITY)
Admission: RE | Admit: 2012-11-20 | Discharge: 2012-11-21 | Disposition: A | Discharge status: Home / Self Care | Payer: Medicare Other | Source: Ambulatory Visit | Attending: Orthopaedic Surgery | Admitting: Orthopaedic Surgery

## 2012-11-20 ENCOUNTER — Encounter (HOSPITAL_COMMUNITY): Payer: Self-pay | Admitting: Anesthesiology

## 2012-11-20 ENCOUNTER — Encounter (HOSPITAL_COMMUNITY)
Admission: RE | Disposition: A | Discharge status: Home / Self Care | Payer: Self-pay | Source: Ambulatory Visit | Attending: Orthopaedic Surgery

## 2012-11-20 DIAGNOSIS — Z01812 Encounter for preprocedural laboratory examination: Secondary | ICD-10-CM | POA: Insufficient documentation

## 2012-11-20 DIAGNOSIS — M25819 Other specified joint disorders, unspecified shoulder: Principal | ICD-10-CM | POA: Insufficient documentation

## 2012-11-20 DIAGNOSIS — Z01810 Encounter for preprocedural cardiovascular examination: Secondary | ICD-10-CM | POA: Insufficient documentation

## 2012-11-20 DIAGNOSIS — M7541 Impingement syndrome of right shoulder: Secondary | ICD-10-CM

## 2012-11-20 HISTORY — PX: SHOULDER ACROMIOPLASTY: SHX6093

## 2012-11-20 LAB — RAPID URINE DRUG SCREEN, HOSP PERFORMED
Amphetamines: NOT DETECTED
Barbiturates: NOT DETECTED
Benzodiazepines: NOT DETECTED
Cocaine: NOT DETECTED
Opiates: POSITIVE — AB
Tetrahydrocannabinol: POSITIVE — AB

## 2012-11-20 LAB — GLUCOSE, CAPILLARY: Glucose-Capillary: 109 mg/dL — ABNORMAL HIGH (ref 70–99)

## 2012-11-20 SURGERY — SHOULDER ACROMIOPLASTY
Anesthesia: General | Site: Shoulder | Laterality: Right | Wound class: Clean

## 2012-11-20 MED ORDER — ONDANSETRON HCL 4 MG/2ML IJ SOLN: 4.0000 mg | Freq: Once | INTRAMUSCULAR | Status: DC | PRN

## 2012-11-20 MED ORDER — SODIUM CHLORIDE 0.9 % IJ SOLN: 9.0000 mL | INTRAMUSCULAR | Status: DC | PRN

## 2012-11-20 MED ORDER — ACETAMINOPHEN 10 MG/ML IV SOLN
INTRAVENOUS | Status: AC
  Filled 2012-11-20: qty 100

## 2012-11-20 MED ORDER — MORPHINE SULFATE (PF) 1 MG/ML IV SOLN
INTRAVENOUS | Status: DC
  Administered 2012-11-20: 18:00:00 via INTRAVENOUS
  Administered 2012-11-20: 22.43 mg via INTRAVENOUS
  Administered 2012-11-20 – 2012-11-21 (×3): via INTRAVENOUS
  Administered 2012-11-21: 30.96 mg via INTRAVENOUS
  Filled 2012-11-20 (×4): qty 25

## 2012-11-20 MED ORDER — LACTATED RINGERS IV SOLN
INTRAVENOUS | Status: DC
  Administered 2012-11-20: 1000 mL via INTRAVENOUS
  Administered 2012-11-20 (×2): via INTRAVENOUS

## 2012-11-20 MED ORDER — CHLORHEXIDINE GLUCONATE 4 % EX LIQD: 60.0000 mL | Freq: Once | CUTANEOUS | Status: DC

## 2012-11-20 MED ORDER — ACETAMINOPHEN 10 MG/ML IV SOLN
1000.0000 mg | Freq: Four times a day (QID) | INTRAVENOUS | Status: AC
  Administered 2012-11-20 – 2012-11-21 (×4): 1000 mg via INTRAVENOUS
  Filled 2012-11-20 (×4): qty 100

## 2012-11-20 MED ORDER — LIDOCAINE HCL (CARDIAC) 20 MG/ML IV SOLN
INTRAVENOUS | Status: DC | PRN
  Administered 2012-11-20: 30 mg via INTRAVENOUS

## 2012-11-20 MED ORDER — NEOSTIGMINE METHYLSULFATE 1 MG/ML IJ SOLN
INTRAMUSCULAR | Status: AC
  Filled 2012-11-20: qty 1

## 2012-11-20 MED ORDER — SUCCINYLCHOLINE CHLORIDE 20 MG/ML IJ SOLN
INTRAMUSCULAR | Status: AC
  Filled 2012-11-20: qty 1

## 2012-11-20 MED ORDER — GLYCOPYRROLATE 0.2 MG/ML IJ SOLN
INTRAMUSCULAR | Status: AC
  Filled 2012-11-20: qty 2

## 2012-11-20 MED ORDER — PROMETHAZINE HCL 25 MG/ML IJ SOLN: 12.5000 mg | INTRAMUSCULAR | Status: DC | PRN

## 2012-11-20 MED ORDER — MIDAZOLAM HCL 2 MG/2ML IJ SOLN
INTRAMUSCULAR | Status: AC
  Filled 2012-11-20: qty 2

## 2012-11-20 MED ORDER — FENTANYL CITRATE 0.05 MG/ML IJ SOLN
INTRAMUSCULAR | Status: AC
  Filled 2012-11-20: qty 5

## 2012-11-20 MED ORDER — FENTANYL CITRATE 0.05 MG/ML IJ SOLN
INTRAMUSCULAR | Status: AC
Start: 2012-11-20 — End: 2012-11-20
  Filled 2012-11-20: qty 2

## 2012-11-20 MED ORDER — ROCURONIUM BROMIDE 50 MG/5ML IV SOLN
INTRAVENOUS | Status: AC
  Filled 2012-11-20: qty 1

## 2012-11-20 MED ORDER — EPHEDRINE SULFATE 50 MG/ML IJ SOLN
INTRAMUSCULAR | Status: DC | PRN
  Administered 2012-11-20: 5 mg via INTRAVENOUS

## 2012-11-20 MED ORDER — DIPHENHYDRAMINE HCL 50 MG/ML IJ SOLN: 12.5000 mg | Freq: Four times a day (QID) | INTRAMUSCULAR | Status: DC | PRN

## 2012-11-20 MED ORDER — FENTANYL CITRATE 0.05 MG/ML IJ SOLN
INTRAMUSCULAR | Status: DC | PRN
  Administered 2012-11-20: 50 ug via INTRAVENOUS
  Administered 2012-11-20: 100 ug via INTRAVENOUS

## 2012-11-20 MED ORDER — PROPOFOL 10 MG/ML IV EMUL
INTRAVENOUS | Status: AC
  Filled 2012-11-20: qty 20

## 2012-11-20 MED ORDER — MIDAZOLAM HCL 2 MG/2ML IJ SOLN
1.0000 mg | INTRAMUSCULAR | Status: DC | PRN
  Administered 2012-11-20 (×2): 2 mg via INTRAVENOUS

## 2012-11-20 MED ORDER — FENTANYL CITRATE 0.05 MG/ML IJ SOLN
INTRAMUSCULAR | Status: AC
  Filled 2012-11-20: qty 2

## 2012-11-20 MED ORDER — NALOXONE HCL 0.4 MG/ML IJ SOLN: 0.4000 mg | INTRAMUSCULAR | Status: DC | PRN

## 2012-11-20 MED ORDER — ZOLPIDEM TARTRATE 5 MG PO TABS
5.0000 mg | ORAL_TABLET | Freq: Every day | ORAL | Status: DC
  Filled 2012-11-20: qty 1

## 2012-11-20 MED ORDER — CEFAZOLIN SODIUM-DEXTROSE 2-3 GM-% IV SOLR
2.0000 g | INTRAVENOUS | Status: AC
  Administered 2012-11-20: 2 g via INTRAVENOUS

## 2012-11-20 MED ORDER — SUCCINYLCHOLINE CHLORIDE 20 MG/ML IJ SOLN
INTRAMUSCULAR | Status: DC | PRN
  Administered 2012-11-20: 100 mg via INTRAVENOUS

## 2012-11-20 MED ORDER — ROCURONIUM BROMIDE 100 MG/10ML IV SOLN
INTRAVENOUS | Status: DC | PRN
  Administered 2012-11-20: 20 mg via INTRAVENOUS
  Administered 2012-11-20: 5 mg via INTRAVENOUS

## 2012-11-20 MED ORDER — DIPHENHYDRAMINE HCL 12.5 MG/5ML PO ELIX: 12.5000 mg | ORAL_SOLUTION | Freq: Four times a day (QID) | ORAL | Status: DC | PRN

## 2012-11-20 MED ORDER — PROPOFOL 10 MG/ML IV BOLUS
INTRAVENOUS | Status: DC | PRN
  Administered 2012-11-20: 150 mg via INTRAVENOUS

## 2012-11-20 MED ORDER — DEXTROSE-NACL 5-0.45 % IV SOLN
INTRAVENOUS | Status: DC
  Administered 2012-11-20: 11:00:00 via INTRAVENOUS

## 2012-11-20 MED ORDER — CEFAZOLIN SODIUM-DEXTROSE 2-3 GM-% IV SOLR
INTRAVENOUS | Status: AC
  Filled 2012-11-20: qty 50

## 2012-11-20 MED ORDER — FENTANYL CITRATE 0.05 MG/ML IJ SOLN
25.0000 ug | INTRAMUSCULAR | Status: DC | PRN
  Administered 2012-11-20 (×4): 50 ug via INTRAVENOUS

## 2012-11-20 MED ORDER — GLYCOPYRROLATE 0.2 MG/ML IJ SOLN
INTRAMUSCULAR | Status: DC | PRN
  Administered 2012-11-20: 0.4 mg via INTRAVENOUS

## 2012-11-20 MED ORDER — NEOSTIGMINE METHYLSULFATE 1 MG/ML IJ SOLN
INTRAMUSCULAR | Status: DC | PRN
  Administered 2012-11-20: 3 mg via INTRAVENOUS

## 2012-11-20 MED ORDER — ONDANSETRON HCL 4 MG/2ML IJ SOLN
INTRAMUSCULAR | Status: AC
  Filled 2012-11-20: qty 2

## 2012-11-20 MED ORDER — 0.9 % SODIUM CHLORIDE (POUR BTL) OPTIME
TOPICAL | Status: DC | PRN
  Administered 2012-11-20: 1000 mL

## 2012-11-20 MED ORDER — ONDANSETRON HCL 4 MG/2ML IJ SOLN: 4.0000 mg | Freq: Four times a day (QID) | INTRAMUSCULAR | Status: DC | PRN

## 2012-11-20 MED ORDER — ONDANSETRON HCL 4 MG/2ML IJ SOLN
4.0000 mg | Freq: Once | INTRAMUSCULAR | Status: AC
  Administered 2012-11-20: 4 mg via INTRAVENOUS

## 2012-11-20 SURGICAL SUPPLY — 45 items
BAG HAMPER (MISCELLANEOUS) ×3 IMPLANT
BNDG COHESIVE 4X5 TAN NS LF (GAUZE/BANDAGES/DRESSINGS) ×3 IMPLANT
CLOTH BEACON ORANGE TIMEOUT ST (SAFETY) ×3 IMPLANT
COOLER CRYO CUFF IC AND MOTOR (MISCELLANEOUS) ×3 IMPLANT
COVER LIGHT HANDLE STERIS (MISCELLANEOUS) ×6 IMPLANT
COVER MAYO STAND XLG (DRAPE) ×3 IMPLANT
CUFF CRYO UNI SHDR 32X48 (MISCELLANEOUS) ×3 IMPLANT
DURAPREP 26ML APPLICATOR (WOUND CARE) ×6 IMPLANT
FORMALIN 10 PREFIL 120ML (MISCELLANEOUS) ×3 IMPLANT
GAUZE XEROFORM 5X9 LF (GAUZE/BANDAGES/DRESSINGS) ×3 IMPLANT
GLOVE BIO SURGEON STRL SZ8 (GLOVE) ×3 IMPLANT
GLOVE BIO SURGEON STRL SZ8.5 (GLOVE) ×3 IMPLANT
GLOVE BIOGEL PI IND STRL 7.0 (GLOVE) ×4 IMPLANT
GLOVE BIOGEL PI INDICATOR 7.0 (GLOVE) ×2
GLOVE ECLIPSE 7.0 STRL STRAW (GLOVE) ×3 IMPLANT
GLOVE EXAM NITRILE MD LF STRL (GLOVE) ×6 IMPLANT
GLOVE SS BIOGEL STRL SZ 6.5 (GLOVE) ×2 IMPLANT
GLOVE SUPERSENSE BIOGEL SZ 6.5 (GLOVE) ×1
GOWN STRL REIN XL XLG (GOWN DISPOSABLE) ×9 IMPLANT
INST SET MINOR BONE (KITS) ×3 IMPLANT
KIT ROOM TURNOVER APOR (KITS) ×3 IMPLANT
KIT SURGICAL DEVON (SET/KITS/TRAYS/PACK) ×3 IMPLANT
MANIFOLD NEPTUNE II (INSTRUMENTS) ×3 IMPLANT
MARKER SKIN DUAL TIP RULER LAB (MISCELLANEOUS) ×3 IMPLANT
NEEDLE MAYO 6 CRC TAPER PT (NEEDLE) IMPLANT
NS IRRIG 1000ML POUR BTL (IV SOLUTION) ×3 IMPLANT
PACK MINOR (CUSTOM PROCEDURE TRAY) ×3 IMPLANT
PAD ABD 5X9 TENDERSORB (GAUZE/BANDAGES/DRESSINGS) ×3 IMPLANT
PAD ARMBOARD 7.5X6 YLW CONV (MISCELLANEOUS) ×3 IMPLANT
RASP LG TEAR CROSS CUT (RASP) ×3 IMPLANT
SET BASIN LINEN APH (SET/KITS/TRAYS/PACK) ×3 IMPLANT
SOL PREP PROV IODINE SCRUB 4OZ (MISCELLANEOUS) IMPLANT
SPONGE GAUZE 4X4 12PLY (GAUZE/BANDAGES/DRESSINGS) ×3 IMPLANT
SPONGE LAP 18X18 X RAY DECT (DISPOSABLE) ×3 IMPLANT
STOCKINETTE IMPERVIOUS LG (DRAPES) ×3 IMPLANT
STRIP CLOSURE SKIN 1/4X3 (GAUZE/BANDAGES/DRESSINGS) ×3 IMPLANT
SUT BONE WAX W31G (SUTURE) IMPLANT
SUT BRALON NAB BRD #1 30IN (SUTURE) IMPLANT
SUT CHROMIC 0 CTX 36 (SUTURE) ×3 IMPLANT
SUT ETHIBOND NAB OS 4 #2 30IN (SUTURE) IMPLANT
SUT ETHILON 3 0 FSL (SUTURE) ×3 IMPLANT
SUT PLAIN 2 0 XLH (SUTURE) ×3 IMPLANT
SYR BULB IRRIGATION 50ML (SYRINGE) ×3 IMPLANT
TAPE MEDIFIX FOAM 3 (GAUZE/BANDAGES/DRESSINGS) ×3 IMPLANT
YANKAUER SUCT 12FT TUBE ARGYLE (SUCTIONS) ×3 IMPLANT

## 2012-11-20 NOTE — Anesthesia Preprocedure Evaluation (Signed)
Anesthesia Evaluation  Patient identified by MRN, date of birth, ID band Patient awake    Reviewed: Allergy & Precautions, H&P , NPO status , Patient's Chart, lab work & pertinent test results  History of Anesthesia Complications Negative for: history of anesthetic complications  Airway Mallampati: III TM Distance: >3 FB Neck ROM: Full    Dental  (+) Edentulous Upper and Edentulous Lower   Pulmonary Current Smoker,  breath sounds clear to auscultation        Cardiovascular negative cardio ROS  Rhythm:Regular Rate:Normal     Neuro/Psych    GI/Hepatic GERD-  Medicated and Controlled,  Endo/Other    Renal/GU      Musculoskeletal   Abdominal   Peds  Hematology   Anesthesia Other Findings   Reproductive/Obstetrics                           Anesthesia Physical Anesthesia Plan  ASA: II  Anesthesia Plan: General   Post-op Pain Management:    Induction: Intravenous, Rapid sequence and Cricoid pressure planned  Airway Management Planned:   Additional Equipment:   Intra-op Plan:   Post-operative Plan: Extubation in OR  Informed Consent: I have reviewed the patients History and Physical, chart, labs and discussed the procedure including the risks, benefits and alternatives for the proposed anesthesia with the patient or authorized representative who has indicated his/her understanding and acceptance.     Plan Discussed with:   Anesthesia Plan Comments:         Anesthesia Quick Evaluation

## 2012-11-20 NOTE — Op Note (Signed)
NAMEELENA, COTHERN NO.:  [id number]  MEDICAL RECORD NO.:  [id number]  LOCATION:  [facility]                          FACILITY:  [facility]  PHYSICIAN:  J. Darreld Mclean, M.D. DATE OF BIRTH:  1950-11-08  DATE OF PROCEDURE: DATE OF DISCHARGE:                              OPERATIVE REPORT   PREOPERATIVE DIAGNOSIS:  Impingement syndrome, right shoulder.  POSTOPERATIVE DIAGNOSIS:  Impingement syndrome, right shoulder.  PROCEDURE:  Acromioplasty of the right shoulder.  ANESTHESIA:  General.  SURGEON:  J. Darreld Mclean, MD.  ASSISTANTSantina Evans Page.  No drains.  INDICATIONS:  The patient is a 62 year old male who has had pain and tenderness in his right shoulder for 10 months or so, getting progressively worse.  He has pain with overhead use.  He has pain rolling over on it.  He has tried physical therapy.  He has had rest and injections, cannot take nonsteroidal anti-inflammatory medicine due to 3 previous GI bleeds.  MRI done on May 2 showed impingement syndrome with known rotator cuff tear.  As he has not improved with conservative treatment, the patient has requested surgery.  I went over the risks and imponderables with him.  He understands, he will be, of course, physical therapy needed later.  Since he cannot take any nonsteroidal anti- inflammatory drugs, he may have a slower recovery period.  He understands and agrees.  He understands and have a general anesthesia as well.  DESCRIPTION OF PROCEDURE:  The patient was seen in the holding area. The right shoulder was identified as correct surgical site.  I placed a mark on the right shoulder.  He was brought in to the operating room and given general anesthesia while supine.  He was changed to semi Sayner position, sandbag under the right shoulder.  He was brought to the edge of the table so the assistant can move his shoulder freely during the procedure.  The patient was prepped and draped in usual manner.   We had a generalized time-out identifying the patient, Mr. Klemp and we were doing right shoulder impingement surgery.  All instrumentation was properly positioned and ready to work.  OR team knew each other.  Incision made between the edge of the acromion and coracoid.  Hemostasis obtained, a suture marker was placed 5 cm below the level of the AC joint to avoid any injury to the nerve structures.  A muscle splitting incision was done.  Coracoacromial ligament was identified and removed sharply.  The patient had an obvious large osteophyte off the tip of the acromion and also a very large spur formation at the [facility] joint itself. It is more bone than usual.  Rotator cuff was inspected and did not appear to be a tear.  Using a broad-based osteotome, acromioplasty was carried out and also using a power rasp, the area was removed.  He had a very nice smooth contour and took over the osteophytes that were present in the [facility] joint area was removed as well, became more free of motion of the shoulder.  Again the rotator cuff area was inspected.  No apparent injury.  It should be noted irrigation was used after using a power rasp.  The deltoid was repaired using 2-0 chromic in a buried figure-of- eight fashion.  The marker suture was removed.  Subcutaneous tissue closed using 2-0 plain and skin reapproximated using running 3-0 nylon subcuticular.  Steri-Strips applied to the wound.  The patient tolerated the procedure well.  He will have a Cryo/Cuff.  He will be admitted overnight for observation for pain control.          ______________________________ J. Darreld Mclean, M.D.     JWK/MEDQ  D:  11/20/2012  T:  11/20/2012  Job:  [id number]

## 2012-11-20 NOTE — Anesthesia Postprocedure Evaluation (Addendum)
  Anesthesia Post-op Note  Patient: LENNIE VASCO  Procedure(s) Performed: Procedure(s): SHOULDER ACROMIOPLASTY (Right)  Patient Location: PACU  Anesthesia Type:General  Level of Consciousness: awake, alert  and oriented  Airway and Oxygen Therapy: Patient Spontanous Breathing  Post-op Pain: mild  Post-op Assessment: Post-op Vital signs reviewed, Patient's Cardiovascular Status Stable, Respiratory Function Stable, Patent Airway, No signs of Nausea or vomiting, Adequate PO intake, Pain level controlled, No headache, No backache, No residual numbness and No residual motor weakness  Post-op Vital Signs: Reviewed and stable  Complications: No apparent anesthesia complications 11/21/12  Patient doing well, VSS.  Being discharged today. No apparent anesthesia complications.

## 2012-11-20 NOTE — Preoperative (Signed)
Beta Blockers   Reason not to administer Beta Blockers:Not Applicable 

## 2012-11-20 NOTE — Progress Notes (Signed)
The History and Physical is unchanged. I have examined the patient. The patient is medically able to have surgery on the right shoulder . Steven [name] 

## 2012-11-20 NOTE — Brief Op Note (Signed)
11/20/2012  8:35 AM  PATIENT:  Steven [name]  62 y.o. male  PRE-OPERATIVE DIAGNOSIS:  impingement right rotator cuff  POST-OPERATIVE DIAGNOSIS:  impingement right rotator cuff  PROCEDURE:  Procedure(s): SHOULDER ACROMIOPLASTY (Right)  SURGEON:  Surgeon(s) and Role:    * Darreld Mclean, MD - Primary  PHYSICIAN ASSISTANT:   ASSISTANTS: C. Page   ANESTHESIA:   general  EBL:  Total I/O In: 1000 [I.V.:1000] Out: 30 [Blood:30]  BLOOD ADMINISTERED:none  DRAINS: none   LOCAL MEDICATIONS USED:  NONE  SPECIMEN:  Source of Specimen:  Right corocoacrominal ligament  DISPOSITION OF SPECIMEN:  PATHOLOGY  COUNTS:  YES  TOURNIQUET:  * No tourniquets in log *  DICTATION: .Other Dictation: Dictation Number [id number]  PLAN OF CARE: Admit for overnight observation  PATIENT DISPOSITION:  PACU - hemodynamically stable.   Delay start of Pharmacological VTE agent (>24hrs) due to surgical blood loss or risk of bleeding: no

## 2012-11-20 NOTE — Transfer of Care (Signed)
Immediate Anesthesia Transfer of Care Note  Patient: Steven [name]  Procedure(s) Performed: Procedure(s): SHOULDER ACROMIOPLASTY (Right)  Patient Location: PACU  Anesthesia Type:General  Level of Consciousness: awake, alert  and oriented  Airway & Oxygen Therapy: Patient Spontanous Breathing  Post-op Assessment: Report given to PACU RN and Post -op Vital signs reviewed and stable  Post vital signs: Reviewed and stable  Complications: No apparent anesthesia complications

## 2012-11-21 MED ORDER — OXYCODONE HCL 5 MG PO TABS
5.0000 mg | ORAL_TABLET | ORAL | Status: DC | PRN
  Administered 2012-11-21: 5 mg via ORAL
  Filled 2012-11-21: qty 1

## 2012-11-21 MED ORDER — OXYCODONE-ACETAMINOPHEN 5-325 MG PO TABS: 2.0000 | ORAL_TABLET | ORAL | Status: DC | PRN

## 2012-11-21 MED ORDER — OXYCODONE-ACETAMINOPHEN 5-325 MG PO TABS: 1.0000 | ORAL_TABLET | ORAL | Status: DC | PRN

## 2012-11-21 MED ORDER — OXYCODONE-ACETAMINOPHEN 7.5-325 MG PO TABS: 1.0000 | ORAL_TABLET | ORAL | Status: DC | PRN

## 2012-11-21 NOTE — Progress Notes (Signed)
Subjective: 1 Day Post-Op Procedure(s) (LRB): SHOULDER ACROMIOPLASTY (Right) Patient reports pain as 3 on 0-10 scale.    Objective: Vital signs in last 24 hours: Temp:  [97.5 F (36.4 C)-98.7 F (37.1 C)] 98.7 F (37.1 C) (06/18 0500) Pulse Rate:  [56-81] 81 (06/18 0500) Resp:  [13-23] 15 (06/18 0500) BP: (116-153)/(55-76) 144/69 mmHg (06/18 0500) SpO2:  [93 %-98 %] 94 % (06/18 0500) Weight:  [74.2 kg (163 lb 9.3 oz)] 74.2 kg (163 lb 9.3 oz) (06/17 1100)  Intake/Output from previous day: 06/17 0701 - 06/18 0700 In: 1440 [P.O.:240; I.V.:1200] Out: 440 [Urine:400; Blood:40] Intake/Output this shift:    No results found for this basename: HGB,  in the last 72 hours No results found for this basename: WBC, RBC, HCT, PLT,  in the last 72 hours No results found for this basename: NA, K, CL, CO2, BUN, CREATININE, GLUCOSE, CALCIUM,  in the last 72 hours No results found for this basename: LABPT, INR,  in the last 72 hours  Neurologically intact Neurovascular intact Sensation intact distally Intact pulses distally Dorsiflexion/Plantar flexion intact Incision: no drainage  Assessment/Plan: 1 Day Post-Op Procedure(s) (LRB): SHOULDER ACROMIOPLASTY (Right) Discharge Home with sling and swathe, oral pain medicine.  Steven [name] 11/21/2012, 7:28 AM

## 2012-11-21 NOTE — Progress Notes (Signed)
UR Chart Review Completed  

## 2012-11-21 NOTE — Progress Notes (Signed)
Went over patient discharge instructions, patient verbalized understanding. Removed IV, skin was clean, dry and intact. Patient stable and in no acute distress. Patient transferred via wheelchair.

## 2012-11-21 NOTE — Discharge Summary (Signed)
Physician Discharge Summary  Patient ID: LAURI TILL MRN: [id number] DOB/AGE: 61-Oct-1952 61 y.o.  Admit date: 11/20/2012 Discharge date: 11/21/2012  Admission Diagnoses: Impingement syndrome of the right shoulder  Discharge Diagnoses: Impingement syndrome of the right shoulder Active Problems:   * No active hospital problems. *   Discharged Condition: good  Hospital Course: He had surgery on the right shoulder and was admitted to observation post operatively for overnight pain control.  His vital signs remained normal.  He had pain controlled with PCA morphine.  He had a cryocuff on the right shoulder.  He has had normal neurovascular status of the right arm.  He understands care of the wound and shoulder when he is discharged.  Consults: None  Significant Diagnostic Studies: labs: normal  Treatments: surgery: Right shoulder acromioplasty  Discharge Exam: Blood pressure 144/69, pulse 81, temperature 98.7 F (37.1 C), temperature source Oral, resp. rate 15, height 5\' 10"  (1.778 m), weight 74.2 kg (163 lb 9.3 oz), SpO2 94.00%. General appearance: alert and cooperative.  Vital signs are normal.  Right shoulder wound is OK and sling and swathe are in good position.  Neurovascular status is normal.  Disposition:   Discharge Orders   Future Orders Complete By Expires     Call MD / Call 911  As directed     Comments:      If you experience chest pain or shortness of breath, CALL 911 and be transported to the hospital emergency room.  If you develope a fever above 101 F, pus (white drainage) or increased drainage or redness at the wound, or calf pain, call your surgeon's office.    Constipation Prevention  As directed     Comments:      Drink plenty of fluids.  Prune juice may be helpful.  You may use a stool softener, such as Colace (over the counter) 100 mg twice a day.  Use MiraLax (over the counter) for constipation as needed.    Diet - low sodium heart healthy  As directed      Discharge instructions  As directed     Comments:      Keep wound dry.   Use sling and swathe.  Sleep with head of bed elevated or use lazy boy type chair.  Keep appointment with Dr. Hilda Lias on July 1st.  If any problem, call Dr. Sanjuan Dame office at [phone] or if after hours, the hospital at [phone].    Increase activity slowly as tolerated  As directed         Medication List    STOP taking these medications       HYDROcodone-acetaminophen 5-325 MG per tablet  Commonly known as:  NORCO/VICODIN      TAKE these medications       oxyCODONE-acetaminophen 7.5-325 MG per tablet  Commonly known as:  PERCOCET  Take 1 tablet by mouth every 4 (four) hours as needed for pain.           Follow-up Information   Follow up with Darreld Mclean, MD In 2 weeks.   Contact information:   [address] [place] [address] [phone]       Signed: Darreld Mclean 11/21/2012, 7:34 AM

## 2012-11-22 ENCOUNTER — Encounter (HOSPITAL_COMMUNITY): Payer: Self-pay | Admitting: Orthopaedic Surgery

## 2014-02-04 ENCOUNTER — Emergency Department (HOSPITAL_COMMUNITY)
Admission: EM | Admit: 2014-02-04 | Discharge: 2014-02-04 | Disposition: A | Discharge status: Home / Self Care | Payer: Medicare Other | Attending: Emergency Medicine | Admitting: Emergency Medicine

## 2014-02-04 ENCOUNTER — Encounter (HOSPITAL_COMMUNITY): Payer: Self-pay | Admitting: Emergency Medicine

## 2014-02-04 DIAGNOSIS — F17200 Nicotine dependence, unspecified, uncomplicated: Secondary | ICD-10-CM | POA: Insufficient documentation

## 2014-02-04 DIAGNOSIS — S3992XA Unspecified injury of lower back, initial encounter: Secondary | ICD-10-CM

## 2014-02-04 DIAGNOSIS — IMO0002 Reserved for concepts with insufficient information to code with codable children: Secondary | ICD-10-CM | POA: Insufficient documentation

## 2014-02-04 DIAGNOSIS — Y9289 Other specified places as the place of occurrence of the external cause: Secondary | ICD-10-CM | POA: Insufficient documentation

## 2014-02-04 DIAGNOSIS — X503XXA Overexertion from repetitive movements, initial encounter: Secondary | ICD-10-CM | POA: Insufficient documentation

## 2014-02-04 DIAGNOSIS — Y9389 Activity, other specified: Secondary | ICD-10-CM | POA: Insufficient documentation

## 2014-02-04 DIAGNOSIS — X500XXA Overexertion from strenuous movement or load, initial encounter: Secondary | ICD-10-CM | POA: Diagnosis not present

## 2014-02-04 MED ORDER — CYCLOBENZAPRINE HCL 10 MG PO TABS: 10.0000 mg | ORAL_TABLET | Freq: Two times a day (BID) | ORAL | Status: DC | PRN

## 2014-02-04 MED ORDER — NAPROXEN 500 MG PO TABS: 500.0000 mg | ORAL_TABLET | Freq: Two times a day (BID) | ORAL | Status: DC

## 2014-02-04 NOTE — ED Provider Notes (Signed)
CSN: [id number]     Arrival date & time 02/04/14  1549 History   None    Chief Complaint  Patient presents with  . Back Pain     (Consider location/radiation/quality/duration/timing/severity/associated sxs/prior Treatment) Patient is a 63 y.o. male presenting with back pain. The history is provided by the patient.  Back Pain Location:  Lumbar spine Quality:  Aching and shooting Radiates to:  Does not radiate Pain severity:  Severe Pain is:  Same all the time Onset quality:  Gradual Duration:  3 days Timing:  Constant Progression:  Worsening Chronicity:  New Context: physical stress   Relieved by:  Narcotics Worsened by:  Movement, ambulation and standing Associated symptoms: no bladder incontinence and no bowel incontinence   Steven [name] is a 63 y.o. male who presents to the ED with low back pain. He states he has had chronic back pain in the past but it usually last a couple days and goes away. He was helping a friend lift a generator 3 days ago and later felt pain in the lower back. He denies loss of control of bladder or bowels, no UTI symptoms. The pain is worse with movement. He has been taking his Norco 7.5 mg. That he has for when his back starts hurting but it continues to hurt.   Past Medical History  Diagnosis Date  . Shoulder impingement 2004   Past Surgical History  Procedure Laterality Date  . Skin cancer excision Left 2013    wrist surgery fell off a roof  . Fracture surgery    . Shoulder acromioplasty Right 11/20/2012    Procedure: SHOULDER ACROMIOPLASTY;  Surgeon: Sanjuana Kava, MD;  Location: [facility];  Service: Orthopedics;  Laterality: Right;   History reviewed. No pertinent family history. History  Substance Use Topics  . Smoking status: Current Every Day Smoker -- 1.00 packs/day for 30 years    Types: Cigars  . Smokeless tobacco: Not on file  . Alcohol Use: No    Review of Systems  Gastrointestinal: Negative for bowel incontinence.   Genitourinary: Negative for bladder incontinence.  Musculoskeletal: Positive for back pain.  All other systems negative    Allergies  Review of patient's allergies indicates no known allergies.  Home Medications   Prior to Admission medications   Medication Sig Start Date End Date Taking? Authorizing Provider  HYDROcodone-acetaminophen (NORCO) 7.5-325 MG per tablet Take 1 tablet by mouth every 4 (four) hours. 02/03/14  Yes Historical Provider, MD   BP 138/72  Pulse 79  Temp(Src) 98.1 F (36.7 C) (Oral)  Resp 20  Ht 5\' 10"  (1.778 m)  Wt 175 lb (79.379 kg)  BMI 25.11 kg/m2  SpO2 98% Physical Exam  Nursing note and vitals reviewed. Constitutional: He is oriented to person, place, and time. He appears well-developed and well-nourished. No distress.  HENT:  Head: Normocephalic and atraumatic.  Eyes: EOM are normal.  Neck: Normal range of motion. Neck supple. No spinous process tenderness present.  Cardiovascular: Normal rate and regular rhythm.   Pulmonary/Chest: Effort normal. No respiratory distress. He has no wheezes. He has no rales.  Abdominal: Soft. Bowel sounds are normal. There is no tenderness.  Musculoskeletal: He exhibits no edema.       Lumbar back: He exhibits tenderness and spasm. He exhibits no deformity and normal pulse.  Pedal pulses equal, adequate circulation, good touch sensation. Pain with straight leg raises. Ambulatory with slow gait due to pain but no foot drag.   Neurological: He is  alert and oriented to person, place, and time. He has normal strength. No cranial nerve deficit or sensory deficit. Coordination and gait normal.  Reflex Scores:      Bicep reflexes are 2+ on the right side and 2+ on the left side.      Brachioradialis reflexes are 2+ on the right side and 2+ on the left side.      Patellar reflexes are 2+ on the right side and 2+ on the left side.      Achilles reflexes are 2+ on the right side and 2+ on the left side. Skin: Skin is warm  and dry.  Psychiatric: He has a normal mood and affect. His behavior is normal.    ED Course  Procedures (including critical care time) Labs Review  MDM  63 y.o. male with low back pain s/p heavy lifting 3 days ago. Will treat with Flexeril. He will follow up with Dr. Luna Glasgow since he usually sees him for his back problems. He will return here as needed if symptoms worsen. Stable for discharge without neuro deficits.    Medication List    TAKE these medications       cyclobenzaprine 10 MG tablet  Commonly known as:  FLEXERIL  Take 1 tablet (10 mg total) by mouth 2 (two) times daily as needed for muscle spasms.      ASK your doctor about these medications       HYDROcodone-acetaminophen 7.5-325 MG per tablet  Commonly known as:  NORCO  Take 1 tablet by mouth every 4 (four) hours.          [place], [place] 02/04/14 [phone]

## 2014-02-04 NOTE — ED Provider Notes (Signed)
Medical screening examination/treatment/procedure(s) were performed by non-physician practitioner and as supervising physician I was immediately available for consultation/collaboration.   EKG Interpretation None      Rolland Porter, MD, Abram Sander   Janice Norrie, MD 02/04/14 2050

## 2014-02-04 NOTE — ED Notes (Signed)
Low back pain since Monday, Helped get a generator out of a car on Sunday

## 2014-08-15 DIAGNOSIS — Z8719 Personal history of other diseases of the digestive system: Secondary | ICD-10-CM | POA: Diagnosis not present

## 2014-08-15 DIAGNOSIS — M25511 Pain in right shoulder: Secondary | ICD-10-CM | POA: Diagnosis not present

## 2014-08-15 DIAGNOSIS — F17200 Nicotine dependence, unspecified, uncomplicated: Secondary | ICD-10-CM | POA: Diagnosis not present

## 2014-10-06 DIAGNOSIS — M545 Low back pain: Secondary | ICD-10-CM | POA: Diagnosis not present

## 2014-10-06 DIAGNOSIS — F17200 Nicotine dependence, unspecified, uncomplicated: Secondary | ICD-10-CM | POA: Diagnosis not present

## 2014-10-06 DIAGNOSIS — Z6823 Body mass index (BMI) 23.0-23.9, adult: Secondary | ICD-10-CM | POA: Diagnosis not present

## 2014-10-06 DIAGNOSIS — Z8719 Personal history of other diseases of the digestive system: Secondary | ICD-10-CM | POA: Diagnosis not present

## 2014-10-17 DIAGNOSIS — M4726 Other spondylosis with radiculopathy, lumbar region: Secondary | ICD-10-CM | POA: Diagnosis not present

## 2014-10-17 DIAGNOSIS — M5116 Intervertebral disc disorders with radiculopathy, lumbar region: Secondary | ICD-10-CM | POA: Diagnosis not present

## 2014-10-17 DIAGNOSIS — M4727 Other spondylosis with radiculopathy, lumbosacral region: Secondary | ICD-10-CM | POA: Diagnosis not present

## 2014-10-24 DIAGNOSIS — M545 Low back pain: Secondary | ICD-10-CM | POA: Diagnosis not present

## 2014-10-24 DIAGNOSIS — Z6825 Body mass index (BMI) 25.0-25.9, adult: Secondary | ICD-10-CM | POA: Diagnosis not present

## 2014-10-24 DIAGNOSIS — F17200 Nicotine dependence, unspecified, uncomplicated: Secondary | ICD-10-CM | POA: Diagnosis not present

## 2014-10-24 DIAGNOSIS — Z8719 Personal history of other diseases of the digestive system: Secondary | ICD-10-CM | POA: Diagnosis not present

## 2014-11-14 DIAGNOSIS — Z8719 Personal history of other diseases of the digestive system: Secondary | ICD-10-CM | POA: Diagnosis not present

## 2014-11-14 DIAGNOSIS — Z6824 Body mass index (BMI) 24.0-24.9, adult: Secondary | ICD-10-CM | POA: Diagnosis not present

## 2014-11-14 DIAGNOSIS — M25511 Pain in right shoulder: Secondary | ICD-10-CM | POA: Diagnosis not present

## 2014-11-14 DIAGNOSIS — F17200 Nicotine dependence, unspecified, uncomplicated: Secondary | ICD-10-CM | POA: Diagnosis not present

## 2014-12-12 DIAGNOSIS — F17200 Nicotine dependence, unspecified, uncomplicated: Secondary | ICD-10-CM | POA: Diagnosis not present

## 2014-12-12 DIAGNOSIS — Z8719 Personal history of other diseases of the digestive system: Secondary | ICD-10-CM | POA: Diagnosis not present

## 2014-12-12 DIAGNOSIS — Z6825 Body mass index (BMI) 25.0-25.9, adult: Secondary | ICD-10-CM | POA: Diagnosis not present

## 2014-12-12 DIAGNOSIS — M25511 Pain in right shoulder: Secondary | ICD-10-CM | POA: Diagnosis not present

## 2014-12-16 DIAGNOSIS — M47816 Spondylosis without myelopathy or radiculopathy, lumbar region: Secondary | ICD-10-CM | POA: Diagnosis not present

## 2015-01-09 DIAGNOSIS — Z8719 Personal history of other diseases of the digestive system: Secondary | ICD-10-CM | POA: Diagnosis not present

## 2015-01-09 DIAGNOSIS — Z6825 Body mass index (BMI) 25.0-25.9, adult: Secondary | ICD-10-CM | POA: Diagnosis not present

## 2015-01-09 DIAGNOSIS — M25511 Pain in right shoulder: Secondary | ICD-10-CM | POA: Diagnosis not present

## 2015-01-09 DIAGNOSIS — F17200 Nicotine dependence, unspecified, uncomplicated: Secondary | ICD-10-CM | POA: Diagnosis not present

## 2015-02-06 DIAGNOSIS — Z8719 Personal history of other diseases of the digestive system: Secondary | ICD-10-CM | POA: Diagnosis not present

## 2015-02-06 DIAGNOSIS — F17200 Nicotine dependence, unspecified, uncomplicated: Secondary | ICD-10-CM | POA: Diagnosis not present

## 2015-02-06 DIAGNOSIS — M25511 Pain in right shoulder: Secondary | ICD-10-CM | POA: Diagnosis not present

## 2015-02-06 DIAGNOSIS — Z6825 Body mass index (BMI) 25.0-25.9, adult: Secondary | ICD-10-CM | POA: Diagnosis not present

## 2015-03-20 DIAGNOSIS — Z8719 Personal history of other diseases of the digestive system: Secondary | ICD-10-CM | POA: Diagnosis not present

## 2015-03-20 DIAGNOSIS — Z6825 Body mass index (BMI) 25.0-25.9, adult: Secondary | ICD-10-CM | POA: Diagnosis not present

## 2015-03-20 DIAGNOSIS — F17200 Nicotine dependence, unspecified, uncomplicated: Secondary | ICD-10-CM | POA: Diagnosis not present

## 2015-03-20 DIAGNOSIS — M25511 Pain in right shoulder: Secondary | ICD-10-CM | POA: Diagnosis not present

## 2015-06-05 DIAGNOSIS — M25561 Pain in right knee: Secondary | ICD-10-CM | POA: Diagnosis not present

## 2015-06-05 DIAGNOSIS — F17200 Nicotine dependence, unspecified, uncomplicated: Secondary | ICD-10-CM | POA: Diagnosis not present

## 2015-06-05 DIAGNOSIS — Z8719 Personal history of other diseases of the digestive system: Secondary | ICD-10-CM | POA: Diagnosis not present

## 2015-06-05 DIAGNOSIS — M25511 Pain in right shoulder: Secondary | ICD-10-CM | POA: Diagnosis not present

## 2015-06-05 DIAGNOSIS — Z6824 Body mass index (BMI) 24.0-24.9, adult: Secondary | ICD-10-CM | POA: Diagnosis not present

## 2015-07-09 ENCOUNTER — Ambulatory Visit (INDEPENDENT_AMBULATORY_CARE_PROVIDER_SITE_OTHER): Payer: Medicare Other | Admitting: Orthopaedic Surgery

## 2015-07-09 ENCOUNTER — Encounter: Payer: Self-pay | Admitting: Orthopaedic Surgery

## 2015-07-09 VITALS — BP 158/81 | HR 50 | Temp 98.1°F | Ht 67.0 in | Wt 159.8 lb

## 2015-07-09 DIAGNOSIS — M25561 Pain in right knee: Secondary | ICD-10-CM | POA: Diagnosis not present

## 2015-07-09 DIAGNOSIS — M25511 Pain in right shoulder: Secondary | ICD-10-CM | POA: Diagnosis not present

## 2015-07-09 MED ORDER — HYDROCODONE-ACETAMINOPHEN 7.5-325 MG PO TABS: 1.0000 | ORAL_TABLET | ORAL | Status: DC | PRN

## 2015-07-09 NOTE — Progress Notes (Addendum)
Patient ID: Steven [name], male   DOB: 03-06-1951, 65 y.o.   MRN: [id number]  Chief Complaint  Patient presents with  . Shoulder Pain    Right  . Knee Pain    Right  . Back Pain    HPI Steven [name] is a 65 y.o. male.  He has right shoulder pain that is improved but his right knee pain is worse, much worse.  He needs a total knee and he knows it but he is not ready to have it done now.  He cannot take NSAIDs.  He has chronic constant pain, rates it 8 of 10 most days.  He has some giving way at times.  He has swelling and popping and pain.  He has no redness.  He uses ice and rubs at time with no help  I told him Dr. Aline Brochure could see him about doing a total knee procedure.  He wants to hold off for now.  Review of Systems Review of Systems  HENT: Negative for congestion.   Eyes: Negative.   Respiratory: Positive for shortness of breath.   Cardiovascular: Negative for chest pain.  Gastrointestinal: Negative for nausea.       GERD  Musculoskeletal: Positive for myalgias, back pain, joint swelling, arthralgias and gait problem.    Past Medical History  Diagnosis Date  . Shoulder impingement 2004    Past Surgical History  Procedure Laterality Date  . Skin cancer excision Left 2013    wrist surgery fell off a roof  . Fracture surgery    . Shoulder acromioplasty Right 11/20/2012    Procedure: SHOULDER ACROMIOPLASTY;  Surgeon: Sanjuana Kava, MD;  Location: [facility];  Service: Orthopedics;  Laterality: Right;    History reviewed. No pertinent family history.  Social History Social History  Substance Use Topics  . Smoking status: Current Every Day Smoker -- 1.00 packs/day for 30 years    Types: Cigars  . Smokeless tobacco: None  . Alcohol Use: No    No Known Allergies  Current Outpatient Prescriptions  Medication Sig Dispense Refill  . cyclobenzaprine (FLEXERIL) 10 MG tablet Take 1 tablet (10 mg total) by mouth 2 (two) times daily as needed for muscle spasms. 20  tablet 0  . HYDROcodone-acetaminophen (NORCO) 7.5-325 MG tablet Take 1 tablet by mouth every 4 (four) hours as needed for moderate pain (Must last 30 days.  Do not drive a call or operate machinery while on this medicine.). 120 tablet 0   No current facility-administered medications for this visit.       Physical Exam Physical Exam  Constitutional: He is oriented to person, place, and time. He appears well-developed and well-nourished.  HENT:  Head: Normocephalic and atraumatic.  Eyes: Conjunctivae and EOM are normal. Pupils are equal, round, and reactive to light.  Neck: Normal range of motion.  Cardiovascular: Normal rate, regular rhythm and intact distal pulses.   Musculoskeletal: He exhibits tenderness (tender right shoulder in full extension but can fully extend.  Some tenderness with resisted abduction,.).       Right knee: He exhibits decreased range of motion, swelling and effusion. Tenderness found. Medial joint line tenderness noted.       Legs: Neurological: He is alert and oriented to person, place, and time. He has normal reflexes.  Skin: Skin is warm and dry.  Psychiatric: He has a normal mood and affect. His behavior is normal. Judgment and thought content normal.   Blood pressure 158/81, pulse 50, temperature 98.1  F (36.7 C), height 5\' 7"  (1.702 m), weight 159 lb 12.8 oz (72.485 kg). Appearance, there are no abnormalities in terms of appearance the patient was well-developed and well-nourished. The grooming and hygiene were normal.  Mental status orientation, there was normal alertness and orientation Mood pleasant Ambulatory status normal with no assistive devices  Examination of the right knee is done Inspection pain, crepitus, slight effusion.  Has limp to the right.  Has normal color. Range of motion 0 to 100 with crepitus Tests for stability stable Motor strength  normal Skin warm dry and intact without laceration or ulceration or erythema Neurologic  examination normal sensation Vascular examination normal pulses with warm extremity and normal capillary refill  The opposite extremity has slight crepitus but normal.     Assessment  Right knee marked DJD, chronic pain Right shoulder pain improved   Plan  Continue pain medicine.  Consider knee replacement.  See in three months.  He works doing roofing work.  I spend some time talking to him that this is not a great idea to continue this type work.  He says he likes it and takes precautions.  I am fearful he might fall off the roof and get severely injured if his knee buckles under him.  He says not.  Return two months.  Pain medicine refilled.

## 2015-07-09 NOTE — Patient Instructions (Signed)
Call if any problem.  Instructions and precautions given.  Continue present medications.

## 2015-08-07 ENCOUNTER — Telehealth: Payer: Self-pay | Admitting: Orthopaedic Surgery

## 2015-08-07 NOTE — Telephone Encounter (Signed)
Patient called and requested a refill on Norco 7.5-325 mgs. Qty 120.  This was last filled on 07-09-15

## 2015-08-10 MED ORDER — HYDROCODONE-ACETAMINOPHEN 7.5-325 MG PO TABS: 1.0000 | ORAL_TABLET | ORAL | Status: DC | PRN

## 2015-08-10 NOTE — Telephone Encounter (Signed)
Rx done. 

## 2015-09-08 ENCOUNTER — Encounter: Payer: Self-pay | Admitting: Orthopaedic Surgery

## 2015-09-08 ENCOUNTER — Ambulatory Visit (INDEPENDENT_AMBULATORY_CARE_PROVIDER_SITE_OTHER): Payer: Medicare Other | Admitting: Orthopaedic Surgery

## 2015-09-08 VITALS — BP 152/80 | HR 70 | Temp 98.1°F | Resp 16 | Ht 67.0 in | Wt 161.0 lb

## 2015-09-08 DIAGNOSIS — M25561 Pain in right knee: Secondary | ICD-10-CM | POA: Diagnosis not present

## 2015-09-08 MED ORDER — OXYCODONE-ACETAMINOPHEN 5-325 MG PO TABS: 1.0000 | ORAL_TABLET | ORAL | Status: DC | PRN

## 2015-09-08 NOTE — Progress Notes (Signed)
Patient [name], male DOB:08-16-1950, 65 y.o. [id number]  Chief Complaint  Patient presents with  . Follow-up    follow up right knee    HPI  DSHAWN [name] is a 65 y.o. male who has chronic right knee pain that is getting worse.  I have talked to him about a total knee.  He has declined consideration until now.  He would like to consider a total knee.  He has constant right knee.  He has popping and pain.  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  I told him a total knee has some restrictions.  He says he understands.  I will have Dr. Aline Brochure see him about evaluation for a right total knee replacement.   Knee Pain  The pain is present in the right knee. The quality of the pain is described as aching, burning and stabbing. The pain is at a severity of 6/10. The pain is moderate. The pain has been worsening since onset. Associated symptoms include an inability to bear weight and a loss of motion. Pertinent negatives include no loss of sensation, muscle weakness, numbness or tingling. The symptoms are aggravated by weight bearing. He has tried acetaminophen, elevation, heat, ice, immobilization, non-weight bearing, NSAIDs and rest for the symptoms. The treatment provided mild relief.    Body mass index is 25.21 kg/(m^2).   Review of Systems  HENT: Negative for congestion.   Eyes: Negative.   Respiratory: Positive for shortness of breath. Negative for cough.   Cardiovascular: Negative for chest pain.  Gastrointestinal: Negative for nausea.       GERD  Endocrine: Negative for cold intolerance.  Musculoskeletal: Positive for myalgias, back pain, joint swelling, arthralgias and gait problem.  Allergic/Immunologic: Negative for environmental allergies.  Neurological: Negative for tingling and numbness.    Past Medical History  Diagnosis Date  . Shoulder impingement 2004    Past Surgical History  Procedure Laterality Date  . Skin cancer excision Left 2013   wrist surgery fell off a roof  . Fracture surgery    . Shoulder acromioplasty Right 11/20/2012    Procedure: SHOULDER ACROMIOPLASTY;  Surgeon: Sanjuana Kava, MD;  Location: [facility];  Service: Orthopedics;  Laterality: Right;    No family history on file.  Social History Social History  Substance Use Topics  . Smoking status: Current Every Day Smoker -- 1.00 packs/day for 30 years    Types: Cigars  . Smokeless tobacco: None  . Alcohol Use: No    No Known Allergies  Current Outpatient Prescriptions  Medication Sig Dispense Refill  . cyclobenzaprine (FLEXERIL) 10 MG tablet Take 1 tablet (10 mg total) by mouth 2 (two) times daily as needed for muscle spasms. 20 tablet 0  . oxyCODONE-acetaminophen (PERCOCET/ROXICET) 5-325 MG tablet Take 1 tablet by mouth every 4 (four) hours as needed for moderate pain or severe pain (Must last 30 days.  Do not drive or operate machinery while taking this medicine). 120 tablet 0   No current facility-administered medications for this visit.     Physical Exam  Blood pressure 152/80, pulse 70, temperature 98.1 F (36.7 C), resp. rate 16, height 5\' 7"  (1.702 m), weight 161 lb (73.029 kg).  Constitutional: overall normal hygiene, normal nutrition, well developed, normal grooming, normal body habitus. Assistive device:none  Musculoskeletal: gait and station Limp right, muscle tone and strength are normal, no tremors or atrophy is present.  .  Neurological: coordination overall normal.  Deep tendon reflex/nerve stretch intact.  Sensation  normal.  Cranial nerves II-XII intact.   Skin:   normal overall no scars, lesions, ulcers or rashes. No psoriasis.  Psychiatric: Alert and oriented x 3.  Recent memory intact, remote memory unclear.  Normal mood and affect. Well groomed.  Good eye contact.  Cardiovascular: overall no swelling, no varicosities, no edema bilaterally, normal temperatures of the legs and arms, no clubbing, cyanosis and good capillary  refill.  Lymphatic: palpation is normal.  The right lower extremity is examined:  Inspection:  Thigh:  Non-tender and no defects  Knee has swelling 1+ effusion.                        Joint tenderness is present                        Patient is tender over the medial joint line  Lower Leg:  Has normal appearance and no tenderness or defects  Ankle:  Non-tender and no defects  Foot:  Non-tender and no defects Range of Motion:  Knee:  Range of motion is: 0-105                        Crepitus is  present  Ankle:  Range of motion is normal. Strength and Tone:  The right lower extremity has normal strength and tone. Stability:  Knee:  The knee is stable.  Ankle:  The ankle is stable.  The patient has been educated about the nature of the problem(s) and counseled on treatment options.  The patient appeared to understand what I have discussed and is in agreement with it.  Encounter Diagnosis  Name Primary?  . Right knee pain Yes    PLAN Call if any problems.  Precautions discussed.  Continue current medications.   Return to clinic to see Dr. Aline Brochure for evaluation for total knee replacement right.

## 2015-09-28 ENCOUNTER — Ambulatory Visit (INDEPENDENT_AMBULATORY_CARE_PROVIDER_SITE_OTHER): Payer: Medicare Other

## 2015-09-28 ENCOUNTER — Ambulatory Visit (INDEPENDENT_AMBULATORY_CARE_PROVIDER_SITE_OTHER): Payer: Medicare Other | Admitting: Orthopedic Surgery

## 2015-09-28 ENCOUNTER — Encounter: Payer: Self-pay | Admitting: Orthopedic Surgery

## 2015-09-28 VITALS — BP 123/67 | Ht 67.0 in | Wt 160.0 lb

## 2015-09-28 DIAGNOSIS — M25561 Pain in right knee: Secondary | ICD-10-CM | POA: Diagnosis not present

## 2015-09-28 DIAGNOSIS — M1711 Unilateral primary osteoarthritis, right knee: Secondary | ICD-10-CM | POA: Diagnosis not present

## 2015-09-28 NOTE — Patient Instructions (Addendum)
You will be scheduled for surgery 10/14/15  You have decided to proceed with knee replacement surgery. You have decided not to continue with nonoperative measures such as but not limited to oral medication, weight loss, activity modification, physical therapy, bracing, or injection.  We will perform the procedure commonly known as total knee replacement. Some of the risks associated with knee replacement surgery include but are not limited to Bleeding Infection Swelling Stiffness Blood clot Pain that persists even after surgery  Infection is especially devastating complication of knee surgery although rare. If infection does occur your implant will usually have to be removed and several surgeries and antibiotics will be needed to eradicate the infection prior to performing a repeat replacement.   If you're not comfortable with these risks and would like to continue with nonoperative treatment please let Dr. Aline Brochure know prior to your surgery.   Total Knee Replacement  Total knee replacement is a surgery to replace your damaged knee joint. Your knee joint is replaced with a man-made (artificial) knee joint. The man-made knee joint is called a prosthesis. This surgery is done to lessen pain and improve movement. BEFORE THE PROCEDURE   Do not eat or drink anything after midnight on the night before the procedure or as told by your doctor.  Ask your doctor about:  Changing or stopping your normal medicines. This is important if you take diabetes medicines or blood thinners.  Taking aspirin or ibuprofen medicines. These thin your blood. Do not take these medicines if your doctor tells you not to.  Plan to have someone take you home after the procedure.  Ask your health care team how your surgery site will be marked.  You may be given medicines that kill germs (antibiotics) to help prevent infection. PROCEDURE   To help prevent infection:  Your health care team will wash or sanitize  their hands.  Your skin will be washed with soap.  An IV tube will be put into one of your veins.  You will be given one or more of the following:  Sedative. This is a medicine that makes you relaxed.  General anesthetic. This is a medicine that makes you fall asleep.  Spinal anesthetic. This is a medicine that numbs your body below the waist.  Nerve block. This is a medicine to block feeling in your leg. This medicine helps to ease pain after surgery.  A cut (incision) will be made in the front of your knee. Your surgeon will take out any damaged parts of your knee joint.  Your surgeon will then:  Put new metal liner over the part of the thigh bone that is taken out.  Put a plastic liner over the shin bone.  Your surgeon may put a plastic piece over the surface of your knee cap.  The cut will be closed. A bandage will be placed over it. AFTER THE PROCEDURE   You will stay in a recovery area until your medicines wear off.  You may have tubes to drain fluid from your knee.  Once you are doing okay, you will be taken to your hospital room.  You may be told to take actions to help prevent blood clots. These may include:  Walking soon after surgery with someone helping you. Moving around helps to improve blood flow.  Taking medicines to thin your blood (anticoagulants).  Wearing special socks (compression stockings) or using other types of devices.  You may need to do exercise therapy (physical therapy) until you  are doing well. Your doctor will tell you when you are well enough to go home.   This information is not intended to replace advice given to you by your health care provider. Make sure you discuss any questions you have with your health care provider.   Document Released: 08/15/2011 Document Revised: 02/11/2015 Document Reviewed: 08/15/2011 Elsevier Interactive Patient Education [employer].

## 2015-09-28 NOTE — Addendum Note (Signed)
Addended by: Baldomero Lamy B on: 09/28/2015 04:58 PM   Modules accepted: Orders

## 2015-09-28 NOTE — Progress Notes (Signed)
Chief Complaint  Patient presents with  . Knee Pain    eval right knee for replacement, referred by Dr Luna Glasgow   4.4.17  HPI - The patient has been referred to me by Dr. Luna Glasgow last seen on April 4 with the following history confirmed.  HPI  Steven [name] is a 65 y.o. male who has chronic right knee pain that is getting worse.  I have talked to him about a total knee.  He has declined consideration until now.  He would like to consider a total knee.  He has constant right knee.  He has popping and pain.  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  I told him a total knee has some restrictions.  He says he understands.  I will have Dr. Aline Brochure see him about evaluation for a right total knee replacement.   Knee Pain  The pain is present in the right knee. The quality of the pain is described as aching, burning and stabbing. The pain is at a severity of 6/10. The pain is moderate. The pain has been worsening since onset. Associated symptoms include an inability to bear weight and a loss of motion. Pertinent negatives include no loss of sensation, muscle weakness, numbness or tingling. The symptoms are aggravated by weight bearing. He has tried acetaminophen, elevation, heat, ice, immobilization, non-weight bearing, NSAIDs and rest for the symptoms. The treatment provided mild relief.    Body mass index is 25.21 kg/(m^2).   ROS Review of Systems  HENT: Negative for congestion.   Eyes: Negative.   Respiratory: Positive for shortness of breath. Negative for cough.   Cardiovascular: Negative for chest pain.  Gastrointestinal: Negative for nausea.        GERD  Endocrine: Negative for cold intolerance.  Musculoskeletal: Positive for myalgias, back pain, joint swelling, arthralgias and gait problem.  Allergic/Immunologic: Negative for environmental allergies.  Neurological: Negative for tingling and numbness.    Past Medical History  Diagnosis Date  . Shoulder impingement 2004     Past Surgical History  Procedure Laterality Date  . Skin cancer excision Left 2013    wrist surgery fell off a roof  . Fracture surgery    . Shoulder acromioplasty Right 11/20/2012    Procedure: SHOULDER ACROMIOPLASTY;  Surgeon: Sanjuana Kava, MD;  Location: [facility];  Service: Orthopedics;  Laterality: Right;   Family history was reviewed and the patient reported no history of heart disease lung disease cancer   Social History  Substance Use Topics  . Smoking status: Current Every Day Smoker -- 1.00 packs/day for 30 years    Types: Cigars  . Smokeless tobacco: None  . Alcohol Use: No    Current outpatient prescriptions:  .  cyclobenzaprine (FLEXERIL) 10 MG tablet, Take 1 tablet (10 mg total) by mouth 2 (two) times daily as needed for muscle spasms., Disp: 20 tablet, Rfl: 0 .  oxyCODONE-acetaminophen (PERCOCET/ROXICET) 5-325 MG tablet, Take 1 tablet by mouth every 4 (four) hours as needed for moderate pain or severe pain (Must last 30 days.  Do not drive or operate machinery while taking this medicine)., Disp: 120 tablet, Rfl: 0  BP 123/67 mmHg  Ht 5\' 7"  (1.702 m)  Wt 160 lb (72.576 kg)  BMI 25.05 kg/m2  Physical Exam  Constitutional: He is oriented to person, place, and time. He appears well-developed and well-nourished. No distress.  Cardiovascular: Normal rate and intact distal pulses.   Neurological: He is alert and oriented to person, place, and  time.  Skin: Skin is warm and dry. No rash noted. He is not diaphoretic. No erythema. No pallor.  Psychiatric: He has a normal mood and affect. His behavior is normal. Judgment and thought content normal.    Ortho Exam  He walks with a limp he has a leg length discrepancy Right knee:  overall alignment is varus. He has a 10 flexion contracture. His goniometer flexion measurement is 110 on the right knee  Knee however is stable his good quadriceps strength is skin is normal his good distal pulses normal sensation no  lymphadenopathy  He's tender along the medial joint line no effusion  We should note he has a short right leg which may contributed some his back pain  His left leg straight normal range of motion stability tests were normal strength was normal skin was normal pulses normal sensation normal  ASSESSMENT: My personal interpretation of the images:  Has a severe varus knee severe medial where severe sclerosis cyst formation and osteophytes  Osteoarthritis severe varus knee    PLAN Scheduled for total knee right leg  This procedure has been fully reviewed with the patient and written informed consent has been obtained.

## 2015-10-06 ENCOUNTER — Telehealth: Payer: Self-pay | Admitting: Orthopaedic Surgery

## 2015-10-06 MED ORDER — OXYCODONE-ACETAMINOPHEN 5-325 MG PO TABS
1.0000 | ORAL_TABLET | ORAL | Status: DC | PRN
Start: 2015-10-06 — End: 2015-11-06

## 2015-10-06 NOTE — Telephone Encounter (Signed)
Oxycodone- Acetaminophen 5/325mg   Qty 120 Tablets °

## 2015-10-06 NOTE — Telephone Encounter (Signed)
Rx done. 

## 2015-10-29 ENCOUNTER — Other Ambulatory Visit (HOSPITAL_COMMUNITY): Payer: Medicare Other

## 2015-10-30 ENCOUNTER — Encounter (HOSPITAL_COMMUNITY): Payer: Self-pay

## 2015-10-30 ENCOUNTER — Other Ambulatory Visit: Payer: Self-pay

## 2015-10-30 ENCOUNTER — Encounter (HOSPITAL_COMMUNITY)
Admission: RE | Admit: 2015-10-30 | Discharge: 2015-10-30 | Disposition: A | Discharge status: Home / Self Care | Payer: Medicare Other | Source: Ambulatory Visit | Attending: Orthopedic Surgery | Admitting: Orthopedic Surgery

## 2015-10-30 DIAGNOSIS — Z01818 Encounter for other preprocedural examination: Secondary | ICD-10-CM | POA: Diagnosis not present

## 2015-10-30 DIAGNOSIS — Z0183 Encounter for blood typing: Secondary | ICD-10-CM | POA: Diagnosis not present

## 2015-10-30 DIAGNOSIS — R001 Bradycardia, unspecified: Secondary | ICD-10-CM | POA: Diagnosis not present

## 2015-10-30 DIAGNOSIS — F17290 Nicotine dependence, other tobacco product, uncomplicated: Secondary | ICD-10-CM | POA: Insufficient documentation

## 2015-10-30 DIAGNOSIS — Z01812 Encounter for preprocedural laboratory examination: Secondary | ICD-10-CM | POA: Insufficient documentation

## 2015-10-30 DIAGNOSIS — M1711 Unilateral primary osteoarthritis, right knee: Secondary | ICD-10-CM | POA: Insufficient documentation

## 2015-10-30 HISTORY — DX: Malignant (primary) neoplasm, unspecified: C80.1

## 2015-10-30 LAB — CBC WITH DIFFERENTIAL/PLATELET
Basophils Absolute: 0.1 10*3/uL (ref 0.0–0.1)
Basophils Relative: 1 %
Eosinophils Absolute: 0.2 10*3/uL (ref 0.0–0.7)
Eosinophils Relative: 3 %
HCT: 47.9 % (ref 39.0–52.0)
Hemoglobin: 16.7 g/dL (ref 13.0–17.0)
Lymphocytes Relative: 22 %
Lymphs Abs: 1.6 10*3/uL (ref 0.7–4.0)
MCH: 33.5 pg (ref 26.0–34.0)
MCHC: 34.9 g/dL (ref 30.0–36.0)
MCV: 96.2 fL (ref 78.0–100.0)
Monocytes Absolute: 0.6 10*3/uL (ref 0.1–1.0)
Monocytes Relative: 8 %
Neutro Abs: 4.9 10*3/uL (ref 1.7–7.7)
Neutrophils Relative %: 66 %
Platelets: 209 10*3/uL (ref 150–400)
RBC: 4.98 MIL/uL (ref 4.22–5.81)
RDW: 13.5 % (ref 11.5–15.5)
WBC: 7.3 10*3/uL (ref 4.0–10.5)

## 2015-10-30 LAB — SURGICAL PCR SCREEN
MRSA, PCR: NEGATIVE
Staphylococcus aureus: NEGATIVE

## 2015-10-30 LAB — PREPARE RBC (CROSSMATCH)

## 2015-10-30 LAB — BASIC METABOLIC PANEL
Anion gap: 5 (ref 5–15)
BUN: 16 mg/dL (ref 6–20)
CO2: 28 mmol/L (ref 22–32)
Calcium: 9.1 mg/dL (ref 8.9–10.3)
Chloride: 104 mmol/L (ref 101–111)
Creatinine, Ser: 0.72 mg/dL (ref 0.61–1.24)
GFR calc Af Amer: >60 mL/min (ref >60)
GFR calc non Af Amer: >60 mL/min (ref >60)
Glucose, Bld: 99 mg/dL (ref 65–99)
Potassium: 4.3 mmol/L (ref 3.5–5.1)
Sodium: 137 mmol/L (ref 135–145)

## 2015-10-30 LAB — PROTIME-INR
INR: 0.96 (ref 0.00–1.49)
Prothrombin Time: 13 seconds (ref 11.6–15.2)

## 2015-10-30 LAB — APTT: aPTT: 29 seconds (ref 24–37)

## 2015-10-30 LAB — ABO/RH: ABO/RH(D): A POS

## 2015-10-30 NOTE — Pre-Procedure Instructions (Signed)
Patient instructed that he should not smoke day of surgery.

## 2015-10-30 NOTE — Patient Instructions (Signed)
Steven [name]  10/30/2015     @PREFPERIOPPHARMACY @   Your procedure is scheduled on  11/04/2015  Report to [facility] at  615  A.M.  Call this number if you have problems the morning of surgery:  [phone]   Remember:  Do not eat food or drink liquids after midnight.  Take these medicines the morning of surgery with A SIP OF WATER  Flexaril, oxycodone.   Do not wear jewelry, make-up or nail polish.  Do not wear lotions, powders, or perfumes.  You may wear deodorant.  Do not shave 48 hours prior to surgery.  Men may shave face and neck.  Do not bring valuables to the hospital.  [facility] is not responsible for any belongings or valuables.  Contacts, dentures or bridgework may not be worn into surgery.  Leave your suitcase in the car.  After surgery it may be brought to your room.  For patients admitted to the hospital, discharge time will be determined by your treatment team.  Patients discharged the day of surgery will not be allowed to drive home.   Name and phone number of your driver:   family Special instructions:  none  Please read over the following fact sheets that you were given. Pain Booklet, Coughing and Deep Breathing, Blood Transfusion Information, Total Joint Packet, MRSA Information, Surgical Site Infection Prevention, Anesthesia Post-op Instructions and Care and Recovery After Surgery      Total Knee Replacement Total knee replacement is a procedure to replace your knee joint with an artificial knee joint (prosthetic knee joint). The purpose of this surgery is to reduce pain and improve your knee function. LET [facility] CARE PROVIDER KNOW ABOUT:   Any allergies you have.  All medicines you are taking, including vitamins, herbs, eye drops, creams, and over-the-counter medicines.  Previous problems you or members of your family have had with the use of anesthetics.  Any blood disorders you have.  Previous surgeries you have  had.  Medical conditions you have. RISKS AND COMPLICATIONS  Generally, total knee replacement is a safe procedure. However, problems can occur, including:  Loss of range of motion of the knee or instability of the knee.  Loosening of the prosthesis.  Infection.  Persistent pain. BEFORE THE PROCEDURE   Plan to have someone take you home after the procedure.  Do not eat or drink anything after midnight on the night before the procedure or as directed by your health care provider.  Ask your health care provider about:  Changing or stopping your regular medicines. This is especially important if you are taking diabetes medicines or blood thinners.  Taking medicines such as aspirin and ibuprofen. These medicines can thin your blood. Do not take these medicines before your procedure if your health care provider asks you not to.  Ask your health care provider about how your surgical site will be marked or identified.  You may be given antibiotic medicines to help prevent infection. PROCEDURE   To reduce your risk of infection:  Your health care team will wash or sanitize their hands.  Your skin will be washed with soap.  An IV tube will be inserted into one of your veins. You will be given one or more of the following:  A medicine that makes you drowsy (sedative).  A medicine that makes you fall asleep (general anesthetic).  A medicine injected into your spine that numbs your body below  the waist (spinal anesthetic).  A medicine to block feeling in your leg (nerve block) to help ease pain after surgery.  An incision will be made in your knee. Your surgeon will take out any damaged cartilage and bone by sawing off the damaged surfaces.  The surgeon will then put a new metal liner over the sawed-off portion of your thigh bone (femur) and a plastic liner over the sawed-off portion of one of the bones of your lower leg (tibia). This is to restore alignment and function to your  knee. A plastic piece is often used to restore the surface of your knee cap. AFTER THE PROCEDURE   You will stay in a recovery area until your medicines wear off.  You may have drainage tubes to drain excess fluid from your knee. These tubes attach to a device that removes these fluids.  Once you are awake, stable, and taking fluids well, you will be taken to your hospital room.  You may be directed to take actions to help prevent blood clots. These may include:  Walking shortly after surgery, with someone assisting you. Moving around after surgery helps to improve blood flow.  Wearing compression stockings or using different types of devices.  You will receive physical therapy as prescribed by your health care provider.   This information is not intended to replace advice given to you by your health care provider. Make sure you discuss any questions you have with your health care provider.   Document Released: 08/29/2000 Document Revised: 02/11/2015 Document Reviewed: 07/03/2011 Elsevier Interactive Patient Education 2016 [place]. Total Knee Replacement, Care After Refer to this sheet in the next few weeks. These instructions provide you with information on caring for yourself after your procedure. Your health care provider also may give you specific instructions. Your treatment has been planned according to the most current medical practices, but problems sometimes occur. Call your health care provider if you have any problems or questions after your procedure. HOME CARE INSTRUCTIONS   See a physical therapist as directed by your health care provider.  Do not take baths, swim, or use a hot tub until your health care provider approves.  Take medicines only as directed by your health care provider.  Avoid lifting or driving until you are instructed otherwise.  If you have been sent home with a continuous passive motion machine, use it as directed by your health care  provider.  Rest often, but move around as much as you can tolerate. Movement helps you to heal and helps to prevent stiffness, skin sores, and blood clots.  Wear compression stockings as told by your health care provider. These stockings help to prevent blood clots and reduce swelling in your legs.  Follow instructions from your health care provider about how to take care of your incision. Make sure you:  Wash your hands with soap and water before you change your bandage (dressing). If soap and water are not available, use hand sanitizer.  Change your dressing as told by your health care provider.  Leave stitches (sutures), skin glue, or adhesive strips in place. These skin closures may need to be in place for 2 weeks or longer. If adhesive strip edges start to loosen and curl up, you may trim the loose edges. Do not remove adhesive strips completely unless your health care provider tells you to do that. SEEK MEDICAL CARE IF:  You have difficulty breathing.  You have drainage, redness, swelling, or pain at your incision site.  You have a bad smell coming from your incision site.  You have persistent bleeding from your incision site.  Your incision breaks open after sutures (stitches) or staples have been removed.  You have a fever. SEEK IMMEDIATE MEDICAL CARE IF:   You have a rash.  You have pain or swelling in your calf or thigh.  You have shortness of breath or chest pain.  Your range of motion in your knee is decreasing rather than increasing.   This information is not intended to replace advice given to you by your health care provider. Make sure you discuss any questions you have with your health care provider.   Document Released: 12/10/2004 Document Revised: 02/11/2015 Document Reviewed: 07/12/2011 Elsevier Interactive Patient Education 2016 [place]. Preparing for Knee Replacement Recovery from knee replacement surgery can be made easier and more comfortable by  being prepared before surgery. This includes:   Arranging for others to help you.  Preparing your home.  Preparing your body by getting a preoperative exam and being as healthy as you can.  Doing exercises before your surgery as directed by your health care provider. You can ease any concerns about your financial responsibilities by calling your insurance company after you decide to have surgery. In addition to asking about your surgery and hospital stay, you will want to ask about coverage for medical equipment, rehabilitation facilities, and home care.  HOW SHOULD I ARRANGE FOR HELP?  You will be stronger and more mobile every day. However, in the first couple weeks after surgery, it is unlikely you will be able to do all your daily activities as easily as before your surgery. You may tire easily and will still have limited movement in your leg. Follow these guidelines to best arrange for the help you may need after your surgery:   Plan to have someone take you home after the procedure. Your health care provider will be able to tell you how many days you can expect to be in the hospital.  Cancel all work, caregiving, and volunteer responsibilities for at least 4-6 weeks after your surgery.  If you live alone, arrange for someone to care for your home and pets for the first 4-6 weeks after surgery.  Select someone with whom you feel comfortable to be with you day and night for the first week. This person will help you with your exercises and personal care, such as bathing and using the toilet.  Arrange for drivers to bring you to and from your follow-up appointments, the grocery store, and other places you may need to go for at least 4-6 weeks. [place]?   Pick a recovery spot, but do not plan on recovering in bed. Sitting upright is better for your health. You may want to use a recliner with a small table nearby. Place the items you use most frequently on that table.  These may include the TV remote, a cordless phone, a book or laptop computer, a water glass, and any other items of your choice.  Remove all clutter from your floors. Also remove any throw rugs.  To see if you will be able to move in your home with a wheeled walker, hold your hands out about 6 inches (15 cm) from your sides. Walk from your recovery spot to your kitchen and bathroom. Then walk from your bed to the bathroom. If you do not hit anything with your hands, you have enough room.  Move the items you  use most often in your kitchen, bathroom, and bedroom to shelves and drawers that are at countertop height.  Prepare a few meals to freeze and reheat later.  Consider adding grab bars in the shower and near the toilet.  While you are in the hospital, you will learn about equipment that can be helpful for your recovery. Some of the equipment includes raised toilet seats, tub benches, and shower benches.  HOW SHOULD I PREPARE MY BODY?   Have a preoperative exam. This will ensure that your body is healthy enough to safely have this surgery. Bring a complete list of all your medicines and supplements, including herbs and vitamins. You may need to have additional tests to ensure your safety.  Have elective dental care and routine cleanings completed before your surgery. Germs from anywhere in your body, including your mouth, can travel to your new joint and infect it. It is important not to have any dental work performed for at least 3 months after your surgery. After surgery, be sure to tell your dentist about your joint replacement.  Maintain a healthy diet. Do not change your diet before surgery unless advised to do so by your health care provider.  Do not use any tobacco products, including cigarettes, chewing tobacco, or electronic cigarettes. If you need help quitting, ask your health care provider. Tobacco and nicotine products can delay healing after your surgery.  The day before your  surgery, follow your health care provider's directions for showering, eating, drinking, and taking medicines. These directions are for your safety. WHAT KINDS OF EXERCISES SHOULD I DO? Your health care provider may have you do the following exercises before your surgery. Be sure to follow the exercise program only as directed by your health care provider. While completing these exercises, remember to stretch for as long as you can, up to 30 seconds. You should only feel a gentle lengthening or release in the stretched tissue. You should not feel pain.  Ankle Pumps  While sitting on a firm surface with your legs straight out in front of you, move your feet at the ankle joints so that your toes are pulling back toward your chest.  Reverse the motion, pointing your toes away from you.  Repeat 10-20 times. Complete this exercise 1-2 times per day. Heel Slides 1. Lie on your back with both knees straight. (If this causes back pain, bend one knee, placing your foot flat on the floor. Keep this leg in this position while doing heel slides with the opposite leg.) 2. Slowly slide one heel back toward your buttocks until you feel a gentle stretch in the front of your knee or thigh. 3. Slowly slide your heel back to the starting position. 4. Repeat 10-20 times, then switch heels and do it again. Complete this exercise 1-2 times per day. Quadriceps Sets 1. Lie on your back with one leg extended and your opposite knee bent.  2. Gradually tighten the muscles in the front of the thigh of your extended leg. This motion will push the back of the knee down toward the floor.  3. Hold the muscle as tightly as you can without causing pain for 10 seconds. 4. Relax the muscles slowly and completely in between each repetition. 5. Repeat 10-20 times, then switch legs and do it again. Complete this exercise 1-2 times per day. Short Arc Kicks 1. Lie on your back. Place a rolled towel (4-6 inches [10-15 cm] high) under  one knee so that the knee  slightly bends. 2. Raise only the lower leg of your slightly bent leg by tightening the muscles in the front of your thigh. Do not allow your thigh to rise. 3. Hold this position for 5 seconds. 4. Repeat 10-20 times, then switch legs and do it again. Complete this exercise 1-2 times per day. Straight Leg Raises 1. Lie on your back with one leg extended and your opposite knee bent. 2. Tighten the muscles in the front of the thigh of your extended leg. Your thigh may shake slightly. 3. Tighten these muscles even more and raise your leg 4-6 inches off the floor. Hold for 3-5 seconds. 4. Keeping these muscles tight, lower your leg. 5. Relax the muscles slowly and completely in between each repetition.  6. Repeat 10-20 times, then switch legs and do it again. Complete this exercise 1-2 times per day. Arm Chair Push-ups 1. Find a firm, non-wheeled chair with solid armrests. 2. Sitting in the chair, extend one leg straight out in front of you. 3. Lift up your body weight, using your arms and opposite leg. 4. Slowly lower your body weight. 5. Repeat 10-20 times, then switch legs and do it again. Complete this exercise 1-2 times per day.   This information is not intended to replace advice given to you by your health care provider. Make sure you discuss any questions you have with your health care provider.   Document Released: 08/27/2010 Document Revised: 06/13/2014 Document Reviewed: 08/15/2013 Elsevier Interactive Patient Education 2016 [employer] PATIENT INSTRUCTIONS POST-ANESTHESIA  IMMEDIATELY FOLLOWING SURGERY:  Do not drive or operate machinery for the first twenty four hours after surgery.  Do not make any important decisions for twenty four hours after surgery or while taking narcotic pain medications or sedatives.  If you develop intractable nausea and vomiting or a severe headache please notify your doctor immediately.  FOLLOW-UP:  Please make an  appointment with your surgeon as instructed. You do not need to follow up with anesthesia unless specifically instructed to do so.  WOUND CARE INSTRUCTIONS (if applicable):  Keep a dry clean dressing on the anesthesia/puncture wound site if there is drainage.  Once the wound has quit draining you may leave it open to air.  Generally you should leave the bandage intact for twenty four hours unless there is drainage.  If the epidural site drains for more than 36-48 hours please call the anesthesia department.  QUESTIONS?:  Please feel free to call your physician or the hospital operator if you have any questions, and they will be happy to assist you.

## 2015-10-30 NOTE — Pre-Procedure Instructions (Signed)
Patient is not interested in [place]. Gave him written information about total knee and went over things verbally as well. Patient given information to sign up for my chart at home.

## 2015-11-03 MED ORDER — TRANEXAMIC ACID 1000 MG/10ML IV SOLN
1000.0000 mg | INTRAVENOUS | Status: DC
  Filled 2015-11-03: qty 10

## 2015-11-03 MED ORDER — TRANEXAMIC ACID 1000 MG/10ML IV SOLN
1000.0000 mg | INTRAVENOUS | Status: AC
  Administered 2015-11-04: 1000 mg via INTRAVENOUS
  Filled 2015-11-03: qty 10

## 2015-11-03 NOTE — H&P (Signed)
Expand All Collapse All   Chief Complaint   Patient presents with   .  Knee Pain       eval right knee for replacement, referred by Dr Luna Glasgow     HPI - The patient has been referred to me by Dr. Luna Glasgow last seen on April 4 with the following history confirmed.  HPI  Steven [name] is a 65 y.o. male who has chronic right knee pain that is getting worse.  I have talked to him about a total knee.  He has declined consideration until now.  He would like to consider a total knee.  He has constant right knee.  He has popping and pain.  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  I told him a total knee has some restrictions.  He says he understands.  I will have Dr. Aline Brochure see him about evaluation for a right total knee replacement.   Knee Pain  The pain is present in the right knee. The quality of the pain is described as aching, burning and stabbing. The pain is at a severity of 6/10. The pain is moderate. The pain has been worsening since onset. Associated symptoms include an inability to bear weight and a loss of motion. Pertinent negatives include no loss of sensation, muscle weakness, numbness or tingling. The symptoms are aggravated by weight bearing. He has tried acetaminophen, elevation, heat, ice, immobilization, non-weight bearing, NSAIDs and rest for the symptoms. The treatment provided mild relief.     Body mass index is 25.21 kg/(m^2).   ROS Review of Systems   HENT: Negative for congestion.    Eyes: Negative.    Respiratory: Positive for shortness of breath. Negative for cough.    Cardiovascular: Negative for chest pain.   Gastrointestinal: Negative for nausea.        GERD  Endocrine: Negative for cold intolerance.   Musculoskeletal: Positive for myalgias, back pain, joint swelling, arthralgias and gait problem.  Allergic/Immunologic: Negative for environmental allergies.   Neurological: Negative for tingling and numbness.      Past Medical History    Diagnosis  Date   .  Shoulder impingement  2004       Past Surgical History   Procedure  Laterality  Date   .  Skin cancer excision  Left  2013       wrist surgery fell off a roof   .  Fracture surgery       .  Shoulder acromioplasty  Right  11/20/2012       Procedure: SHOULDER ACROMIOPLASTY;  Surgeon: Sanjuana Kava, MD;  Location: [facility]; Service: Orthopedics;  Laterality: Right;    Family history was reviewed and the patient reported no history of heart disease lung disease cancer     Social History   Substance Use Topics   .  Smoking status:  Current Every Day Smoker -- 1.00 packs/day for 30 years       Types:  Cigars   .  Smokeless tobacco:  None   .  Alcohol Use:  No     Current outpatient prescriptions:   .  cyclobenzaprine (FLEXERIL) 10 MG tablet, Take 1 tablet (10 mg total) by mouth 2 (two) times daily as needed for muscle spasms., Disp: 20 tablet, Rfl: 0 .  oxyCODONE-acetaminophen (PERCOCET/ROXICET) 5-325 MG tablet, Take 1 tablet by mouth every 4 (four) hours as needed for moderate pain or severe pain (Must last 30 days.  Do not  drive or operate machinery while taking this medicine)., Disp: 120 tablet, Rfl: 0  BP 123/67 mmHg  Ht 5\' 7"  (1.702 m)  Wt 160 lb (72.576 kg)  BMI 25.05 kg/m2  Physical Exam  Constitutional: He is oriented to person, place, and time. He appears well-developed and well-nourished. No distress.  Cardiovascular: Normal rate and intact distal pulses.   Neurological: He is alert and oriented to person, place, and time.  Skin: Skin is warm and dry. No rash noted. He is not diaphoretic. No erythema. No pallor.  Psychiatric: He has a normal mood and affect. His behavior is normal. Judgment and thought content normal.    Ortho Exam  He walks with a limp he has a leg length discrepancy Right knee:   overall alignment is varus. He has a 10 flexion contracture. His goniometer flexion measurement is 110 on the right knee  Knee however is stable his  good quadriceps strength is skin is normal his good distal pulses normal sensation no lymphadenopathy  He's tender along the medial joint line no effusion  We should note he has a short right leg which may contributed some his back pain  His left leg straight normal range of motion stability tests were normal strength was normal skin was normal pulses normal sensation normal  ASSESSMENT: My personal interpretation of the images:   Has a severe varus knee severe medial where severe sclerosis cyst formation and osteophytes  Osteoarthritis severe varus right knee    PLAN Scheduled for total knee right leg  This procedure has been fully reviewed with the patient and written informed consent has been obtained.

## 2015-11-04 ENCOUNTER — Inpatient Hospital Stay (HOSPITAL_COMMUNITY): Payer: Medicare Other

## 2015-11-04 ENCOUNTER — Inpatient Hospital Stay (HOSPITAL_COMMUNITY)
Admission: RE | Admit: 2015-11-04 | Discharge: 2015-11-06 | DRG: 470 | Disposition: A | Discharge status: Home Health | Payer: Medicare Other | Source: Ambulatory Visit | Attending: Orthopedic Surgery | Admitting: Orthopedic Surgery

## 2015-11-04 ENCOUNTER — Inpatient Hospital Stay (HOSPITAL_COMMUNITY): Payer: Medicare Other | Admitting: Anesthesiology

## 2015-11-04 ENCOUNTER — Encounter (HOSPITAL_COMMUNITY)
Admission: RE | Disposition: A | Discharge status: Home Health | Payer: Self-pay | Source: Ambulatory Visit | Attending: Orthopedic Surgery

## 2015-11-04 ENCOUNTER — Encounter (HOSPITAL_COMMUNITY): Payer: Self-pay | Admitting: *Deleted

## 2015-11-04 DIAGNOSIS — Z96651 Presence of right artificial knee joint: Secondary | ICD-10-CM | POA: Diagnosis not present

## 2015-11-04 DIAGNOSIS — M1711 Unilateral primary osteoarthritis, right knee: Principal | ICD-10-CM | POA: Diagnosis present

## 2015-11-04 DIAGNOSIS — F17290 Nicotine dependence, other tobacco product, uncomplicated: Secondary | ICD-10-CM | POA: Diagnosis present

## 2015-11-04 DIAGNOSIS — Z85828 Personal history of other malignant neoplasm of skin: Secondary | ICD-10-CM | POA: Diagnosis not present

## 2015-11-04 DIAGNOSIS — M25561 Pain in right knee: Secondary | ICD-10-CM | POA: Diagnosis not present

## 2015-11-04 DIAGNOSIS — Z471 Aftercare following joint replacement surgery: Secondary | ICD-10-CM | POA: Diagnosis not present

## 2015-11-04 DIAGNOSIS — G8929 Other chronic pain: Secondary | ICD-10-CM | POA: Diagnosis present

## 2015-11-04 DIAGNOSIS — M1990 Unspecified osteoarthritis, unspecified site: Secondary | ICD-10-CM | POA: Insufficient documentation

## 2015-11-04 DIAGNOSIS — M179 Osteoarthritis of knee, unspecified: Secondary | ICD-10-CM | POA: Diagnosis not present

## 2015-11-04 HISTORY — PX: TOTAL KNEE ARTHROPLASTY: SHX125

## 2015-11-04 SURGERY — ARTHROPLASTY, KNEE, TOTAL
Anesthesia: Spinal | Laterality: Right

## 2015-11-04 MED ORDER — ASPIRIN EC 325 MG PO TBEC
325.0000 mg | DELAYED_RELEASE_TABLET | Freq: Every day | ORAL | Status: DC
  Administered 2015-11-05 – 2015-11-06 (×2): 325 mg via ORAL
  Filled 2015-11-04 (×2): qty 1

## 2015-11-04 MED ORDER — BISACODYL 10 MG RE SUPP: 10.0000 mg | Freq: Every day | RECTAL | Status: DC | PRN

## 2015-11-04 MED ORDER — BUPIVACAINE IN DEXTROSE 0.75-8.25 % IT SOLN
INTRATHECAL | Status: AC
Start: 2015-11-04 — End: 2015-11-04
  Filled 2015-11-04: qty 2

## 2015-11-04 MED ORDER — ACETAMINOPHEN 325 MG PO TABS: 650.0000 mg | ORAL_TABLET | Freq: Four times a day (QID) | ORAL | Status: DC | PRN

## 2015-11-04 MED ORDER — SODIUM CHLORIDE 0.9 % IR SOLN
Status: DC | PRN
  Administered 2015-11-04: 3000 mL

## 2015-11-04 MED ORDER — BUPIVACAINE-EPINEPHRINE (PF) 0.5% -1:200000 IJ SOLN
INTRAMUSCULAR | Status: DC | PRN
  Administered 2015-11-04: 30 mL

## 2015-11-04 MED ORDER — ONDANSETRON HCL 4 MG/2ML IJ SOLN: 4.0000 mg | Freq: Four times a day (QID) | INTRAMUSCULAR | Status: DC | PRN

## 2015-11-04 MED ORDER — LACTATED RINGERS IV SOLN
INTRAVENOUS | Status: DC
  Administered 2015-11-04 (×2): via INTRAVENOUS
  Administered 2015-11-04: 1000 mL via INTRAVENOUS

## 2015-11-04 MED ORDER — MIDAZOLAM HCL 2 MG/2ML IJ SOLN
1.0000 mg | INTRAMUSCULAR | Status: DC | PRN
  Administered 2015-11-04: 2 mg via INTRAVENOUS

## 2015-11-04 MED ORDER — BUPIVACAINE HCL (PF) 0.75 % IJ SOLN
INTRAMUSCULAR | Status: DC | PRN
  Administered 2015-11-04: 1 mg via INTRATHECAL

## 2015-11-04 MED ORDER — MENTHOL 3 MG MT LOZG
1.0000 | LOZENGE | OROMUCOSAL | Status: DC | PRN
Start: 2015-11-04 — End: 2015-11-06

## 2015-11-04 MED ORDER — POLYETHYLENE GLYCOL 3350 17 G PO PACK: 17.0000 g | PACK | Freq: Every day | ORAL | Status: DC | PRN

## 2015-11-04 MED ORDER — FENTANYL CITRATE (PF) 100 MCG/2ML IJ SOLN
INTRAMUSCULAR | Status: AC
  Filled 2015-11-04: qty 2

## 2015-11-04 MED ORDER — OXYCODONE HCL 5 MG PO TABS
ORAL_TABLET | ORAL | Status: AC
  Filled 2015-11-04: qty 1

## 2015-11-04 MED ORDER — SODIUM CHLORIDE 0.9 % IV SOLN
INTRAVENOUS | Status: DC
  Administered 2015-11-04 – 2015-11-05 (×2): via INTRAVENOUS

## 2015-11-04 MED ORDER — KETOROLAC TROMETHAMINE 15 MG/ML IJ SOLN
7.5000 mg | Freq: Four times a day (QID) | INTRAMUSCULAR | Status: AC
  Administered 2015-11-05 (×2): 7.5 mg via INTRAVENOUS
  Filled 2015-11-04 (×2): qty 1

## 2015-11-04 MED ORDER — BUPIVACAINE-EPINEPHRINE (PF) 0.5% -1:200000 IJ SOLN
INTRAMUSCULAR | Status: AC
  Filled 2015-11-04: qty 30

## 2015-11-04 MED ORDER — OXYCODONE-ACETAMINOPHEN 5-325 MG PO TABS
ORAL_TABLET | ORAL | Status: AC
  Filled 2015-11-04: qty 1

## 2015-11-04 MED ORDER — SODIUM CHLORIDE 0.9 % IJ SOLN
INTRAMUSCULAR | Status: AC
Start: 2015-11-04 — End: 2015-11-04
  Filled 2015-11-04: qty 10

## 2015-11-04 MED ORDER — DEXTROSE 5 % IV SOLN
500.0000 mg | Freq: Four times a day (QID) | INTRAVENOUS | Status: DC | PRN
  Filled 2015-11-04: qty 5

## 2015-11-04 MED ORDER — CHLORHEXIDINE GLUCONATE 4 % EX LIQD: 60.0000 mL | Freq: Once | CUTANEOUS | Status: DC

## 2015-11-04 MED ORDER — METOCLOPRAMIDE HCL 10 MG PO TABS: 5.0000 mg | ORAL_TABLET | Freq: Three times a day (TID) | ORAL | Status: DC | PRN

## 2015-11-04 MED ORDER — CELECOXIB 100 MG PO CAPS
200.0000 mg | ORAL_CAPSULE | Freq: Two times a day (BID) | ORAL | Status: DC
  Administered 2015-11-04 – 2015-11-06 (×3): 200 mg via ORAL
  Filled 2015-11-04 (×3): qty 2

## 2015-11-04 MED ORDER — ALUM & MAG HYDROXIDE-SIMETH 200-200-20 MG/5ML PO SUSP: 30.0000 mL | ORAL | Status: DC | PRN

## 2015-11-04 MED ORDER — CEFAZOLIN SODIUM 1-5 GM-% IV SOLN
1.0000 g | Freq: Four times a day (QID) | INTRAVENOUS | Status: AC
  Administered 2015-11-04 (×2): 1 g via INTRAVENOUS
  Filled 2015-11-04 (×2): qty 50

## 2015-11-04 MED ORDER — OXYCODONE-ACETAMINOPHEN 5-325 MG PO TABS
1.0000 | ORAL_TABLET | ORAL | Status: DC
Start: 2015-11-04 — End: 2015-11-04
  Administered 2015-11-04 (×2): 1 via ORAL
  Filled 2015-11-04: qty 1

## 2015-11-04 MED ORDER — BUPIVACAINE LIPOSOME 1.3 % IJ SUSP
20.0000 mL | Freq: Once | INTRAMUSCULAR | Status: DC
  Filled 2015-11-04: qty 20

## 2015-11-04 MED ORDER — CELECOXIB 100 MG PO CAPS
200.0000 mg | ORAL_CAPSULE | Freq: Every day | ORAL | Status: DC
  Administered 2015-11-04 – 2015-11-06 (×3): 200 mg via ORAL
  Filled 2015-11-04 (×3): qty 2

## 2015-11-04 MED ORDER — ONDANSETRON HCL 4 MG PO TABS: 4.0000 mg | ORAL_TABLET | Freq: Four times a day (QID) | ORAL | Status: DC | PRN

## 2015-11-04 MED ORDER — PREGABALIN 50 MG PO CAPS
50.0000 mg | ORAL_CAPSULE | Freq: Three times a day (TID) | ORAL | Status: DC
  Administered 2015-11-04 – 2015-11-06 (×7): 50 mg via ORAL
  Filled 2015-11-04 (×6): qty 1

## 2015-11-04 MED ORDER — PROPOFOL 10 MG/ML IV BOLUS
INTRAVENOUS | Status: AC
  Filled 2015-11-04: qty 40

## 2015-11-04 MED ORDER — BUPIVACAINE LIPOSOME 1.3 % IJ SUSP
INTRAMUSCULAR | Status: AC
  Filled 2015-11-04: qty 20

## 2015-11-04 MED ORDER — OXYCODONE HCL 5 MG PO TABS
5.0000 mg | ORAL_TABLET | ORAL | Status: DC | PRN
  Administered 2015-11-04: 10 mg via ORAL
  Filled 2015-11-04: qty 2

## 2015-11-04 MED ORDER — FENTANYL CITRATE (PF) 100 MCG/2ML IJ SOLN
25.0000 ug | INTRAMUSCULAR | Status: DC | PRN
  Administered 2015-11-04 (×4): 50 ug via INTRAVENOUS

## 2015-11-04 MED ORDER — HYDROMORPHONE HCL 1 MG/ML IJ SOLN
0.5000 mg | INTRAMUSCULAR | Status: DC | PRN
  Administered 2015-11-04 – 2015-11-06 (×21): 0.5 mg via INTRAVENOUS
  Filled 2015-11-04 (×21): qty 1

## 2015-11-04 MED ORDER — FENTANYL CITRATE (PF) 100 MCG/2ML IJ SOLN
INTRAMUSCULAR | Status: DC | PRN
  Administered 2015-11-04: 25 ug via INTRATHECAL

## 2015-11-04 MED ORDER — OXYCODONE HCL 5 MG PO TABS
5.0000 mg | ORAL_TABLET | ORAL | Status: DC
  Administered 2015-11-04: 5 mg via ORAL

## 2015-11-04 MED ORDER — SENNA 8.6 MG PO TABS
1.0000 | ORAL_TABLET | Freq: Two times a day (BID) | ORAL | Status: DC
  Administered 2015-11-04 – 2015-11-06 (×5): 8.6 mg via ORAL
  Filled 2015-11-04 (×5): qty 1

## 2015-11-04 MED ORDER — EPHEDRINE SULFATE 50 MG/ML IJ SOLN
INTRAMUSCULAR | Status: AC
  Filled 2015-11-04: qty 1

## 2015-11-04 MED ORDER — CEFAZOLIN SODIUM-DEXTROSE 2-4 GM/100ML-% IV SOLN
2.0000 g | INTRAVENOUS | Status: AC
  Administered 2015-11-04: 2 g via INTRAVENOUS

## 2015-11-04 MED ORDER — SODIUM CHLORIDE 0.9 % IV SOLN
INTRAVENOUS | Status: DC | PRN
  Administered 2015-11-04: 60 mL

## 2015-11-04 MED ORDER — MAGNESIUM CITRATE PO SOLN: 1.0000 | Freq: Once | ORAL | Status: DC | PRN

## 2015-11-04 MED ORDER — CELECOXIB 100 MG PO CAPS
ORAL_CAPSULE | ORAL | Status: AC
  Filled 2015-11-04: qty 2

## 2015-11-04 MED ORDER — ONDANSETRON HCL 4 MG/2ML IJ SOLN: 4.0000 mg | Freq: Once | INTRAMUSCULAR | Status: DC | PRN

## 2015-11-04 MED ORDER — MIDAZOLAM HCL 2 MG/2ML IJ SOLN
INTRAMUSCULAR | Status: AC
  Filled 2015-11-04: qty 2

## 2015-11-04 MED ORDER — PHENOL 1.4 % MT LIQD
1.0000 | OROMUCOSAL | Status: DC | PRN
Start: 2015-11-04 — End: 2015-11-06

## 2015-11-04 MED ORDER — FENTANYL CITRATE (PF) 100 MCG/2ML IJ SOLN
25.0000 ug | INTRAMUSCULAR | Status: AC
  Administered 2015-11-04 (×2): 25 ug via INTRAVENOUS

## 2015-11-04 MED ORDER — PROPOFOL 500 MG/50ML IV EMUL
INTRAVENOUS | Status: DC | PRN
  Administered 2015-11-04: 75 ug/kg/min via INTRAVENOUS

## 2015-11-04 MED ORDER — SODIUM CHLORIDE 0.9 % IJ SOLN
INTRAMUSCULAR | Status: AC
  Filled 2015-11-04: qty 40

## 2015-11-04 MED ORDER — DEXAMETHASONE SODIUM PHOSPHATE 10 MG/ML IJ SOLN
10.0000 mg | Freq: Once | INTRAMUSCULAR | Status: AC
  Administered 2015-11-05: 10 mg via INTRAVENOUS
  Filled 2015-11-04: qty 1

## 2015-11-04 MED ORDER — CEFAZOLIN SODIUM-DEXTROSE 2-4 GM/100ML-% IV SOLN
INTRAVENOUS | Status: AC
Start: 2015-11-04 — End: 2015-11-04
  Filled 2015-11-04: qty 100

## 2015-11-04 MED ORDER — ACETAMINOPHEN 650 MG RE SUPP: 650.0000 mg | Freq: Four times a day (QID) | RECTAL | Status: DC | PRN

## 2015-11-04 MED ORDER — SUCCINYLCHOLINE CHLORIDE 20 MG/ML IJ SOLN
INTRAMUSCULAR | Status: AC
Start: 2015-11-04 — End: 2015-11-04
  Filled 2015-11-04: qty 1

## 2015-11-04 MED ORDER — PREGABALIN 50 MG PO CAPS
ORAL_CAPSULE | ORAL | Status: AC
  Filled 2015-11-04: qty 1

## 2015-11-04 MED ORDER — METHOCARBAMOL 500 MG PO TABS: 500.0000 mg | ORAL_TABLET | Freq: Four times a day (QID) | ORAL | Status: DC | PRN

## 2015-11-04 MED ORDER — DIPHENHYDRAMINE HCL 12.5 MG/5ML PO ELIX: 12.5000 mg | ORAL_SOLUTION | ORAL | Status: DC | PRN

## 2015-11-04 MED ORDER — METOCLOPRAMIDE HCL 5 MG/ML IJ SOLN: 5.0000 mg | Freq: Three times a day (TID) | INTRAMUSCULAR | Status: DC | PRN

## 2015-11-04 MED ORDER — METHOCARBAMOL 1000 MG/10ML IJ SOLN
500.0000 mg | Freq: Four times a day (QID) | INTRAVENOUS | Status: DC
  Administered 2015-11-04 – 2015-11-06 (×8): 500 mg via INTRAVENOUS
  Filled 2015-11-04 (×21): qty 5

## 2015-11-04 MED ORDER — FENTANYL CITRATE (PF) 100 MCG/2ML IJ SOLN
INTRAMUSCULAR | Status: AC
Start: 2015-11-04 — End: 2015-11-04
  Filled 2015-11-04: qty 2

## 2015-11-04 MED ORDER — 0.9 % SODIUM CHLORIDE (POUR BTL) OPTIME
TOPICAL | Status: DC | PRN
  Administered 2015-11-04: 1000 mL

## 2015-11-04 SURGICAL SUPPLY — 69 items
BAG HAMPER (MISCELLANEOUS) ×2 IMPLANT
BANDAGE ESMARK 6X9 LF (GAUZE/BANDAGES/DRESSINGS) ×1 IMPLANT
BIT DRILL 3.2X128 (BIT) ×6 IMPLANT
BLADE HEX COATED 2.75 (ELECTRODE) ×2 IMPLANT
BLADE SAGITTAL 25.0X1.27X90 (BLADE) IMPLANT
BNDG ESMARK 6X9 LF (GAUZE/BANDAGES/DRESSINGS) ×2
BOWL SMART MIX CTS (DISPOSABLE) IMPLANT
CAP KNEE ATTUNE RP ×2 IMPLANT
CEMENT HV SMART SET (Cement) ×4 IMPLANT
CEMENTED HIPS (CAP) IMPLANT
CLOTH BEACON ORANGE TIMEOUT ST (SAFETY) ×2 IMPLANT
COOLER CRYO CUFF IC AND MOTOR (MISCELLANEOUS) ×2 IMPLANT
COVER LIGHT HANDLE STERIS (MISCELLANEOUS) ×4 IMPLANT
COVER PROBE W GEL 5X96 (DRAPES) ×2 IMPLANT
CUFF CRYO KNEE LG 20X31 COOLER (ORTHOPEDIC SUPPLIES) IMPLANT
CUFF CRYO KNEE18X23 MED (MISCELLANEOUS) ×2 IMPLANT
CUFF TOURNIQUET SINGLE 34IN LL (TOURNIQUET CUFF) ×2 IMPLANT
DECANTER SPIKE VIAL GLASS SM (MISCELLANEOUS) ×2 IMPLANT
DRAPE BACK TABLE (DRAPES) ×2 IMPLANT
DRAPE EXTREMITY T 121X128X90 (DRAPE) ×2 IMPLANT
DRSG AQUACEL AG ADV 3.5X10 (GAUZE/BANDAGES/DRESSINGS) ×2 IMPLANT
DURAPREP 26ML APPLICATOR (WOUND CARE) ×4 IMPLANT
ELECT REM PT RETURN 9FT ADLT (ELECTROSURGICAL) ×2
ELECTRODE REM PT RTRN 9FT ADLT (ELECTROSURGICAL) ×1 IMPLANT
EVACUATOR 3/16  PVC DRAIN (DRAIN) ×1
EVACUATOR 3/16 PVC DRAIN (DRAIN) ×1 IMPLANT
GLOVE BIO SURGEON STRL SZ7 (GLOVE) ×6 IMPLANT
GLOVE BIOGEL PI IND STRL 7.0 (GLOVE) ×3 IMPLANT
GLOVE BIOGEL PI IND STRL 7.5 (GLOVE) ×1 IMPLANT
GLOVE BIOGEL PI INDICATOR 7.0 (GLOVE) ×3
GLOVE BIOGEL PI INDICATOR 7.5 (GLOVE) ×1
GLOVE EXAM NITRILE MD LF STRL (GLOVE) ×2 IMPLANT
GLOVE SKINSENSE NS SZ8.0 LF (GLOVE) ×2
GLOVE SKINSENSE STRL SZ8.0 LF (GLOVE) ×2 IMPLANT
GLOVE SS N UNI LF 8.5 STRL (GLOVE) ×2 IMPLANT
GOWN STRL REUS W/ TWL LRG LVL3 (GOWN DISPOSABLE) ×1 IMPLANT
GOWN STRL REUS W/TWL LRG LVL3 (GOWN DISPOSABLE) ×7 IMPLANT
GOWN STRL REUS W/TWL XL LVL3 (GOWN DISPOSABLE) ×2 IMPLANT
HANDPIECE INTERPULSE COAX TIP (DISPOSABLE) ×1
HOOD W/PEELAWAY (MISCELLANEOUS) ×8 IMPLANT
INST SET MAJOR BONE (KITS) ×2 IMPLANT
IV NS IRRIG 3000ML ARTHROMATIC (IV SOLUTION) ×2 IMPLANT
KIT BLADEGUARD II DBL (SET/KITS/TRAYS/PACK) ×2 IMPLANT
KIT ROOM TURNOVER APOR (KITS) ×2 IMPLANT
MANIFOLD NEPTUNE II (INSTRUMENTS) ×2 IMPLANT
MARKER SKIN DUAL TIP RULER LAB (MISCELLANEOUS) ×2 IMPLANT
NEEDLE HYPO 21X1.5 SAFETY (NEEDLE) ×2 IMPLANT
NS IRRIG 1000ML POUR BTL (IV SOLUTION) ×2 IMPLANT
PACK TOTAL JOINT (CUSTOM PROCEDURE TRAY) ×2 IMPLANT
PAD ARMBOARD 7.5X6 YLW CONV (MISCELLANEOUS) ×2 IMPLANT
PAD DANNIFLEX CPM (ORTHOPEDIC SUPPLIES) ×2 IMPLANT
PIN TROCAR 3 INCH (PIN) IMPLANT
SAW OSC TIP CART 19.5X105X1.3 (SAW) ×2 IMPLANT
SET BASIN LINEN APH (SET/KITS/TRAYS/PACK) ×2 IMPLANT
SET HNDPC FAN SPRY TIP SCT (DISPOSABLE) ×1 IMPLANT
STAPLER VISISTAT 35W (STAPLE) ×2 IMPLANT
SUT BRALON NAB BRD #1 30IN (SUTURE) ×4 IMPLANT
SUT MNCRL 0 VIOLET CTX 36 (SUTURE) ×1 IMPLANT
SUT MON AB 0 CT1 (SUTURE) ×2 IMPLANT
SUT MON AB 2-0 CT1 36 (SUTURE) IMPLANT
SUT MONOCRYL 0 CTX 36 (SUTURE) ×1
SYR 20CC LL (SYRINGE) ×4 IMPLANT
SYR 30ML LL (SYRINGE) ×4 IMPLANT
SYR BULB IRRIGATION 50ML (SYRINGE) ×2 IMPLANT
TOWEL OR 17X26 4PK STRL BLUE (TOWEL DISPOSABLE) ×2 IMPLANT
TOWER CARTRIDGE SMART MIX (DISPOSABLE) IMPLANT
TRAY FOLEY CATH SILVER 16FR (SET/KITS/TRAYS/PACK) ×2 IMPLANT
WATER STERILE IRR 1000ML POUR (IV SOLUTION) ×2 IMPLANT
YANKAUER SUCT 12FT TUBE ARGYLE (SUCTIONS) ×2 IMPLANT

## 2015-11-04 NOTE — Anesthesia Postprocedure Evaluation (Signed)
Anesthesia Post Note  Patient: Steven [name]  Procedure(s) Performed: Procedure(s) (LRB): RIGHT TOTAL KNEE ARTHROPLASTY (Right)  Patient location during evaluation: PACU Anesthesia Type: Epidural Level of consciousness: awake and alert Pain management: pain level controlled Vital Signs Assessment: post-procedure vital signs reviewed and stable Respiratory status: spontaneous breathing and patient connected to nasal cannula oxygen Cardiovascular status: stable Postop Assessment: no signs of nausea or vomiting Anesthetic complications: no    Last Vitals:  Filed Vitals:   11/04/15 0700 11/04/15 0715  BP: 135/86 144/74  Pulse:    Temp:    Resp: 17 17    Last Pain: There were no vitals filed for this visit.               ADAMS, AMY A

## 2015-11-04 NOTE — Anesthesia Preprocedure Evaluation (Signed)
Anesthesia Evaluation  Patient identified by MRN, date of birth, ID band Patient awake    Reviewed: Allergy & Precautions, NPO status , Patient's Chart, lab work & pertinent test results  Airway Mallampati: II  TM Distance: >3 FB     Dental  (+) Edentulous Upper, Edentulous Lower   Pulmonary Current Smoker,    breath sounds clear to auscultation       Cardiovascular negative cardio ROS   Rhythm:Regular Rate:Normal     Neuro/Psych    GI/Hepatic PUD, GERD  ,  Endo/Other    Renal/GU      Musculoskeletal   Abdominal   Peds  Hematology   Anesthesia Other Findings   Reproductive/Obstetrics                             Anesthesia Physical Anesthesia Plan  ASA: II  Anesthesia Plan: Spinal   Post-op Pain Management:    Induction:   Airway Management Planned: Simple Face Mask  Additional Equipment:   Intra-op Plan:   Post-operative Plan:   Informed Consent: I have reviewed the patients History and Physical, chart, labs and discussed the procedure including the risks, benefits and alternatives for the proposed anesthesia with the patient or authorized representative who has indicated his/her understanding and acceptance.     Plan Discussed with:   Anesthesia Plan Comments:         Anesthesia Quick Evaluation

## 2015-11-04 NOTE — Brief Op Note (Signed)
11/04/2015  9:47 AM  PATIENT:  Kate Sable  65 y.o. male  PRE-OPERATIVE DIAGNOSIS:  RIGHT KNEE OSTEOARTHRITIS  POST-OPERATIVE DIAGNOSIS:  RIGHT KNEE OSTEOARTHRITIS  PROCEDURE:  Procedure(s): RIGHT TOTAL KNEE ARTHROPLASTY (Right)   ATTUNE 58F 8T 5 POLY PS 38 X 10 PATELLA   SURGEON:  Surgeon(s) and Role:    * Carole Civil, MD - Primary  Assisted by Barnetta Chapel page and Simonne Maffucci  Spinal anesthesia EBL:  Total I/O In: 2000 [I.V.:2000] Out: 110 [Urine:100; Blood:10]  One Hemovac drain in the joint 20 mL of X Brown diluted with 40 mL of saline and half percent Marcaine with epinephrine 20 mL  No specimens Dragon Dictation  Counts were correct  TOURNIQUET:   Total Tourniquet Time Documented: Thigh (Right) - 90 minutes Total: Thigh (Right) - 90 minutes   DICTATION: .Viviann Spare Dictation  PLAN OF CARE: Admit to inpatient   PATIENT DISPOSITION:  PACU - hemodynamically stable.   Delay start of Pharmacological VTE agent (>24hrs) due to surgical blood loss or risk of bleeding: yes   Details of procedure  The patient was identified in the preop holding area. After thorough chart review the surgical site was confirmed and marked. The patient was taken to the operating room where they were given appropriate antibiotics based on the SCIP protocol. Anesthesia was then administered by spinal  technique without complication. Foley catheter was then inserted.  In the supine position the right leg was prepped and draped from the great toe to the groin with DuraPrep . Surgical team was gowned and sterile draping was performed in the usual manner. The timeout was then completed all present were in agreement with the procedure of  RIGHT total knee arthroplasty on the patient identified [place]. Implants were present x-rays were available and reviewed.  A midline incision was made over the RIGHT knee and taken down to the extensor mechanism. A medial arthrotomy was  performed and the patella was everted excising the fat pad. With the knee in flexion the anterior cruciate ligament and PCL were removed. The medial and lateral meniscal remnants were removed. The medial soft tissue sleeve was elevated to the mid coronal plane and the tibia was subluxated forward.  Tibial preparation: Medial, lateral and posterior retractors were placed in the external alignment guide was placed on the tibia. The lower side was the medial side  We referenced the higher lateral side and reference 8 mm resection. The proximal tibia was removed with an oscillating saw. The tibia was then sized as a  Size 8 ATTUNE.  Femoral preparation Appropriate collateral ligament retractors were placed and the three-eighths inch drill bit was placed into the femoral canal. The canal was then suctioned and irrigated until the fluid was clear. The FEMORAL alignment guide was set for a 9 mm distal cut with a 5 valgus angle for RIGHT knee. Once this block was pinned the distal femoral cut was made and checked for flatness. The femur was then measured and the measurement was a size  8.  The 4-in-1 cutting block was placed and we made for distal cuts. The posterior femoral condyles were checked for osteophytes. In any osteophytes were removed.  Spacer block check: Spacer blocks were placed in the joint with the knee in extension. The knee balanced in extension with a 5MM PS ATTUNE  spacer block. This also balanced the knee in flexion.  The femoral notch was then cut using a notch cutting block for the appropriate size  femur.  Trial reduction was performed with a size 8 femur, size 8 tibia, and a size 5MM insert. The range of motion with trial implants in place was 0-130. The tibial rotation was then set with the knee in extension. The patella tracked normally  We then removed the trial components and placed the trial tibial component on the proximal tibia and drilled and punched the tibia per manufacture  technique.  Patella preparation: The patella was prepared by measuring its thickness which was 26. We decided to cut the patella with the goal of leaving 16 mm. Patella cutting guide was placed in the posterior surface of the patella was resected and when we were done we had  16 mm of patella remaining. The patella was then sized to a size  38 X 10.  Exparel was injected on the bony surfaces collateral ligaments and patellar tendon quadriceps tendon and medial and lateral gutters.  The cement was mixed on the back table. The implants were checked for expiration date and for size and were opened. The implants were then cemented into place with excess cement removed as it cured. Once the cement was cured the joint was irrigated thoroughly.  Closure: A Hemovac drain was placed. The extensor mechanism was closed with #1 Bralon suture and a combination of running and interrupted fashion. Subcutaneous tissue was closed with 0 Monocryl suture. The joint was injected with 30 mL of Marcaine. The skin was reapproximated with skin staples.  A sterile dressing was applied a Cryo/Cuff was applied and a long TED stockings was applied up to the thigh.  The patient was taken recovery room in stable condition.

## 2015-11-04 NOTE — Op Note (Signed)
11/04/2015  9:47 AM  PATIENT:  Kate Sable  65 y.o. male  PRE-OPERATIVE DIAGNOSIS:  RIGHT KNEE OSTEOARTHRITIS  POST-OPERATIVE DIAGNOSIS:  RIGHT KNEE OSTEOARTHRITIS  PROCEDURE:  Procedure(s): RIGHT TOTAL KNEE ARTHROPLASTY (Right)   ATTUNE 12F 8T 5 POLY PS 38 X 10 PATELLA   SURGEON:  Surgeon(s) and Role:    * Carole Civil, MD - Primary  Assisted by Barnetta Chapel page and Simonne Maffucci  Spinal anesthesia EBL:  Total I/O In: 2000 [I.V.:2000] Out: 110 [Urine:100; Blood:10]  One Hemovac drain in the joint 20 mL of X Brown diluted with 40 mL of saline and half percent Marcaine with epinephrine 20 mL  No specimens Dragon Dictation  Counts were correct  TOURNIQUET:   Total Tourniquet Time Documented: Thigh (Right) - 90 minutes Total: Thigh (Right) - 90 minutes   DICTATION: .Viviann Spare Dictation  PLAN OF CARE: Admit to inpatient   PATIENT DISPOSITION:  PACU - hemodynamically stable.   Delay start of Pharmacological VTE agent (>24hrs) due to surgical blood loss or risk of bleeding: yes   Details of procedure  The patient was identified in the preop holding area. After thorough chart review the surgical site was confirmed and marked. The patient was taken to the operating room where they were given appropriate antibiotics based on the SCIP protocol. Anesthesia was then administered by spinal  technique without complication. Foley catheter was then inserted.  In the supine position the right leg was prepped and draped from the great toe to the groin with DuraPrep . Surgical team was gowned and sterile draping was performed in the usual manner. The timeout was then completed all present were in agreement with the procedure of  RIGHT total knee arthroplasty on the patient identified [place]. Implants were present x-rays were available and reviewed.  A midline incision was made over the RIGHT knee and taken down to the extensor mechanism. A medial arthrotomy was  performed and the patella was everted excising the fat pad. With the knee in flexion the anterior cruciate ligament and PCL were removed. The medial and lateral meniscal remnants were removed. The medial soft tissue sleeve was elevated to the mid coronal plane and the tibia was subluxated forward.  Tibial preparation: Medial, lateral and posterior retractors were placed in the external alignment guide was placed on the tibia. The lower side was the medial side  We referenced the higher lateral side and reference 8 mm resection. The proximal tibia was removed with an oscillating saw. The tibia was then sized as a  Size 8 ATTUNE.  Femoral preparation Appropriate collateral ligament retractors were placed and the three-eighths inch drill bit was placed into the femoral canal. The canal was then suctioned and irrigated until the fluid was clear. The FEMORAL alignment guide was set for a 9 mm distal cut with a 5 valgus angle for RIGHT knee. Once this block was pinned the distal femoral cut was made and checked for flatness. The femur was then measured and the measurement was a size  8.  The 4-in-1 cutting block was placed and we made for distal cuts. The posterior femoral condyles were checked for osteophytes. In any osteophytes were removed.  Spacer block check: Spacer blocks were placed in the joint with the knee in extension. The knee balanced in extension with a 5MM PS ATTUNE  spacer block. This also balanced the knee in flexion.  The femoral notch was then cut using a notch cutting block for the appropriate size  femur.  Trial reduction was performed with a size 8 femur, size 8 tibia, and a size 5MM insert. The range of motion with trial implants in place was 0-130. The tibial rotation was then set with the knee in extension. The patella tracked normally  We then removed the trial components and placed the trial tibial component on the proximal tibia and drilled and punched the tibia per manufacture  technique.  Patella preparation: The patella was prepared by measuring its thickness which was 26. We decided to cut the patella with the goal of leaving 16 mm. Patella cutting guide was placed in the posterior surface of the patella was resected and when we were done we had  16 mm of patella remaining. The patella was then sized to a size  38 X 10.  Exparel was injected on the bony surfaces collateral ligaments and patellar tendon quadriceps tendon and medial and lateral gutters.  The cement was mixed on the back table. The implants were checked for expiration date and for size and were opened. The implants were then cemented into place with excess cement removed as it cured. Once the cement was cured the joint was irrigated thoroughly.  Closure: A Hemovac drain was placed. The extensor mechanism was closed with #1 Bralon suture and a combination of running and interrupted fashion. Subcutaneous tissue was closed with 0 Monocryl suture. The joint was injected with 30 mL of Marcaine. The skin was reapproximated with skin staples.  A sterile dressing was applied a Cryo/Cuff was applied and a long TED stockings was applied up to the thigh.  The patient was taken recovery room in stable condition.

## 2015-11-04 NOTE — Anesthesia Procedure Notes (Addendum)
Date/Time: 11/04/2015 7:28 AM Performed by: Andree Elk, AMY A Pre-anesthesia Checklist: Patient identified, Timeout performed, Emergency Drugs available, Suction available and Patient being monitored Oxygen Delivery Method: Simple face mask   Spinal Patient location during procedure: OR Start time: 11/04/2015 7:42 AM Staffing Resident/CRNA: ADAMS, AMY A Preanesthetic Checklist Completed: patient identified, site marked, surgical consent, pre-op evaluation, timeout performed, IV checked, risks and benefits discussed and monitors and equipment checked Spinal Block Patient position: right lateral decubitus Prep: Betadine Patient monitoring: heart rate, cardiac monitor, continuous pulse ox and blood pressure Approach: right paramedian Location: L3-4 Injection technique: single-shot Needle Needle type: Spinocan  Needle gauge: 22 G Needle length: 9 cm Assessment Sensory level: T8 Additional Notes ATTEMPTS:1 TRAY MR:[id number] TRAY EXPIRATION DATE:12/2015

## 2015-11-04 NOTE — Interval H&P Note (Signed)
History and Physical Interval Note:  11/04/2015 7:18 AM  Steven [name]  has presented today for surgery, with the diagnosis of RIGHT KNEE OSTEOARTHRITIS  The various methods of treatment have been discussed with the patient and family. After consideration of risks, benefits and other options for treatment, the patient has consented to  Procedure(s): TOTAL KNEE ARTHROPLASTY (Right) as a surgical intervention .  The patient's history has been reviewed, patient examined, no change in status, stable for surgery.  I have reviewed the patient's chart and labs.  Questions were answered to the patient's satisfaction.     Arther Abbott

## 2015-11-04 NOTE — Transfer of Care (Signed)
Immediate Anesthesia Transfer of Care Note  Patient: Steven [name]  Procedure(s) Performed: Procedure(s): RIGHT TOTAL KNEE ARTHROPLASTY (Right)  Patient Location: PACU  Anesthesia Type:Spinal  Level of Consciousness: awake, alert , oriented and patient cooperative  Airway & Oxygen Therapy: Patient Spontanous Breathing and Patient connected to nasal cannula oxygen  Post-op Assessment: Report given to RN and Post -op Vital signs reviewed and stable  Post vital signs: Reviewed and stable  Last Vitals:  Filed Vitals:   11/04/15 0700 11/04/15 0715  BP: 135/86 144/74  Pulse:    Temp:    Resp: 17 17    Last Pain: There were no vitals filed for this visit.       Complications: No apparent anesthesia complications

## 2015-11-04 NOTE — Evaluation (Signed)
Physical Therapy Evaluation Patient Details Name: Steven [name] MRN: [id number] DOB: Dec 23, 1950 Today's Date: 11/04/2015   History of Present Illness  65 yo M admitted s/p R TKA.  PMH: shoulder impingement, skin CA excision, wrist surgery, R shoulder acromioplasty.   Clinical Impression  Pt received in bed, RN just finishing giving pain medication, but pt is agreeable to PT evaluation.  Pt states he lives with his friend Louie Casa, and normally is independent with ambulation, ADL's, and IADL's.  Today, pt demonstrated bed mobility at Mod (I), transferred sit<>stand with Min guard and RW, and ambulated 57ft with RW and Min guard.  Pt demonstrates R knee AROM from -10* extension - 89* flexion.  Pt is recommended for d/c home with HHPT, RW and BSC.      Follow Up Recommendations Home health PT;Supervision - Intermittent    Equipment Recommendations  Rolling walker with 5" wheels;3in1 (PT)    Recommendations for Other Services       Precautions / Restrictions Precautions Precautions: Knee Precaution Booklet Issued: No Restrictions Weight Bearing Restrictions: Yes RLE Weight Bearing: Weight bearing as tolerated      Mobility  Bed Mobility Overal bed mobility: Modified Independent                Transfers Overall transfer level: Needs assistance Equipment used: Rolling walker (2 wheeled) Transfers: Sit to/from Stand Sit to Stand: Min guard            Ambulation/Gait Ambulation/Gait assistance: Min guard Ambulation Distance (Feet): 50 Feet Assistive device: Rolling walker (2 wheeled) Gait Pattern/deviations: Step-through pattern;Decreased stance time - right;Antalgic        Stairs            Wheelchair Mobility    Modified Rankin (Stroke Patients Only)       Balance Overall balance assessment: Needs assistance         Standing balance support: Bilateral upper extremity supported Standing balance-Leahy Scale: Fair                               Pertinent Vitals/Pain Pain Assessment: 0-10 Pain Score: 10-Worst pain ever Pain Location: R knee - surgical Pain Intervention(s): Premedicated before session;Ice applied;Repositioned    Home Living Family/patient expects to be discharged to:: Private residence Living Arrangements: Non-relatives/Friends [name]) Available Help at Discharge: Friend(s) Type of Home: House Home Access: Stairs to enter   [employer] of Steps: 1 Home Layout: One level Home Equipment: Cane - single point      Prior Function Level of Independence: Independent         Comments: Pt was ambulating without DME, and was independent with ADL's, and IADL's.      Hand Dominance        Extremity/Trunk Assessment   Upper Extremity Assessment: Defer to OT evaluation           Lower Extremity Assessment: Generalized weakness RLE Deficits / Details: Grossly 3/5       Communication   Communication: No difficulties  Cognition Arousal/Alertness: Awake/alert Behavior During Therapy: WFL for tasks assessed/performed Overall Cognitive Status: Within Functional Limits for tasks assessed                      General Comments      Exercises Total Joint Exercises Ankle Circles/Pumps: AROM;Both;20 reps;Supine Quad Sets: Strengthening;Both;10 reps;Supine Gluteal Sets: Strengthening;Both;10 reps;Supine Heel Slides: Strengthening;10 reps;Supine;Right Goniometric ROM: R knee -10* -  89*      Assessment/Plan    PT Assessment Patient needs continued PT services  PT Diagnosis Difficulty walking;Abnormality of gait;Generalized weakness   PT Problem List Decreased strength;Decreased range of motion;Decreased activity tolerance;Decreased balance;Decreased mobility;Decreased knowledge of use of DME;Decreased safety awareness;Decreased knowledge of precautions;Pain  PT Treatment Interventions DME instruction;Gait training;Stair training;Functional mobility  training;Therapeutic activities;Therapeutic exercise;Balance training;Patient/family education   PT Goals (Current goals can be found in the Care Plan section) Acute Rehab PT Goals Patient Stated Goal: Pt wants to go home soon PT Goal Formulation: With patient Time For Goal Achievement: 11/11/15 Potential to Achieve Goals: Good    Frequency BID   Barriers to discharge        Co-evaluation               End of Session Equipment Utilized During Treatment: Gait belt Activity Tolerance: Patient tolerated treatment well Patient left: in chair;with call bell/phone within reach Nurse Communication: Mobility status    Functional Assessment Tool Used: [employer] "6-clicks"  Functional Limitation: Mobility: Walking and moving around Mobility: Walking and Moving Around Current Status [phone]): At least 20 percent but less than 40 percent impaired, limited or restricted Mobility: Walking and Moving Around Goal Status [phone]): At least 1 percent but less than 20 percent impaired, limited or restricted    Time: 1522-1555 PT Time Calculation (min) (ACUTE ONLY): 33 min   Charges:   PT Evaluation $PT Eval Low Complexity: 1 Procedure PT Treatments $Gait Training: 8-22 mins   PT G Codes:   PT G-Codes **NOT FOR INPATIENT CLASS** Functional Assessment Tool Used: [employer] "6-clicks"  Functional Limitation: Mobility: Walking and moving around Mobility: Walking and Moving Around Current Status [phone]): At least 20 percent but less than 40 percent impaired, limited or restricted Mobility: Walking and Moving Around Goal Status [phone]): At least 1 percent but less than 20 percent impaired, limited or restricted    Tacy Learn, PT, DPT X: [id number]   11/04/2015, 4:22 PM

## 2015-11-05 ENCOUNTER — Encounter (HOSPITAL_COMMUNITY): Payer: Self-pay | Admitting: Orthopedic Surgery

## 2015-11-05 LAB — CBC
HCT: 39.9 % (ref 39.0–52.0)
Hemoglobin: 13.5 g/dL (ref 13.0–17.0)
MCH: 32.5 pg (ref 26.0–34.0)
MCHC: 33.8 g/dL (ref 30.0–36.0)
MCV: 96.1 fL (ref 78.0–100.0)
Platelets: 183 10*3/uL (ref 150–400)
RBC: 4.15 MIL/uL — ABNORMAL LOW (ref 4.22–5.81)
RDW: 13.4 % (ref 11.5–15.5)
WBC: 9.2 10*3/uL (ref 4.0–10.5)

## 2015-11-05 LAB — BASIC METABOLIC PANEL
Anion gap: 5 (ref 5–15)
BUN: 11 mg/dL (ref 6–20)
CO2: 24 mmol/L (ref 22–32)
Calcium: 8.1 mg/dL — ABNORMAL LOW (ref 8.9–10.3)
Chloride: 104 mmol/L (ref 101–111)
Creatinine, Ser: 0.51 mg/dL — ABNORMAL LOW (ref 0.61–1.24)
GFR calc Af Amer: >60 mL/min (ref >60)
GFR calc non Af Amer: >60 mL/min (ref >60)
Glucose, Bld: 140 mg/dL — ABNORMAL HIGH (ref 65–99)
Potassium: 4.2 mmol/L (ref 3.5–5.1)
Sodium: 133 mmol/L — ABNORMAL LOW (ref 135–145)

## 2015-11-05 MED ORDER — OXYCODONE HCL 5 MG PO TABS
15.0000 mg | ORAL_TABLET | ORAL | Status: DC
Start: 2015-11-05 — End: 2015-11-05

## 2015-11-05 MED ORDER — ONDANSETRON HCL 4 MG/2ML IJ SOLN
4.0000 mg | Freq: Four times a day (QID) | INTRAMUSCULAR | Status: DC
  Administered 2015-11-05 – 2015-11-06 (×5): 4 mg via INTRAVENOUS
  Filled 2015-11-05 (×5): qty 2

## 2015-11-05 MED ORDER — OXYCODONE HCL 5 MG PO TABS
15.0000 mg | ORAL_TABLET | ORAL | Status: DC
Start: 2015-11-05 — End: 2015-11-06
  Administered 2015-11-05 – 2015-11-06 (×8): 15 mg via ORAL
  Filled 2015-11-05 (×8): qty 3

## 2015-11-05 NOTE — Progress Notes (Signed)
Physical Therapy Treatment Patient Details Name: Steven [name] MRN: [id number] DOB: 19-Nov-1950 Today's Date: 11/05/2015    History of Present Illness 65 yo M admitted s/p R TKA.  PMH: shoulder impingement, skin CA excision, wrist surgery, R shoulder acromioplasty.     PT Comments    Pt received in bed, and was agreeable to PT tx.  Pt continues to c/o increased R knee pain, and states that he has built up a tolerance to pain medication prior to his surgery.  Pt encouraged to use the cryocuff to assist with pain management.  Pt still lacking 10* from full extension, and only able to achieve ~75* of flexion today due to pain.  He is progressing with his HEP, however continued poor quadricep contraction.  Pt ambulated 15ft with RW, and supervision.  Plan to practice 1 step at next visit to ensure safety for d/c home.   Follow Up Recommendations  Home health PT;Supervision - Intermittent     Equipment Recommendations  Rolling walker with 5" wheels;3in1 (PT)    Recommendations for Other Services       Precautions / Restrictions Precautions Precautions: Knee Precaution Booklet Issued: No Restrictions Weight Bearing Restrictions: Yes RLE Weight Bearing: Weight bearing as tolerated    Mobility  Bed Mobility Overal bed mobility: Modified Independent             General bed mobility comments: Cueing and min A with Rt LE  Transfers Overall transfer level: Needs assistance Equipment used: Rolling walker (2 wheeled) Transfers: Sit to/from Stand Sit to Stand: Min guard         General transfer comment: Cueing for hand placement  Ambulation/Gait Ambulation/Gait assistance: Supervision Ambulation Distance (Feet): 150 Feet Assistive device: Rolling walker (2 wheeled) Gait Pattern/deviations: Step-through pattern;Decreased stance time - right;Decreased stride length;Antalgic   Gait velocity interpretation: at or above normal speed for age/gender     Stairs            Wheelchair Mobility    Modified Rankin (Stroke Patients Only)       Balance           Standing balance support: Bilateral upper extremity supported Standing balance-Leahy Scale: Fair                      Cognition Arousal/Alertness: Awake/alert Behavior During Therapy: WFL for tasks assessed/performed Overall Cognitive Status: Within Functional Limits for tasks assessed                      Exercises Total Joint Exercises Ankle Circles/Pumps: AROM;Both;20 reps;Supine Quad Sets: Strengthening;Both;10 reps;Supine Gluteal Sets: Strengthening;Both;10 reps;Supine Short Arc Quad: Strengthening;Right;10 reps;Supine;Other (comment) (Min A) Heel Slides: AROM;Right;10 reps;Supine Long Arc Quad: Strengthening;Right;10 reps;Seated;Other (comment) (Min A) Goniometric ROM: R knee -10* extension, and 75* flexion today with increaed pain.      General Comments        Pertinent Vitals/Pain Pain Assessment: 0-10 Pain Score: 10-Worst pain ever Pain Location: R knee - surgical - pt states he has a high tolerance for pain medication Pain Intervention(s): Premedicated before session;Ice applied    Home Living                      Prior Function            PT Goals (current goals can now be found in the care plan section) Acute Rehab PT Goals Patient Stated Goal: Pt wants to go home soon PT Goal  Formulation: With patient Time For Goal Achievement: 11/11/15 Potential to Achieve Goals: Good Progress towards PT goals: Progressing toward goals    Frequency  BID    PT Plan Current plan remains appropriate    Co-evaluation             End of Session Equipment Utilized During Treatment: Gait belt Activity Tolerance: Patient tolerated treatment well Patient left: in bed;with call bell/phone within reach;Other (comment) (CPM not yet arrived and called again to have it brought up. )     Time: [id number] PT Time Calculation (min) (ACUTE ONLY):  31 min  Charges:  $Gait Training: 8-22 mins $Therapeutic Exercise: 8-22 mins $Therapeutic Activity: 8-22 mins                    G Codes:      Beth Marijah Larranaga, PT, DPT X: [id number]   11/05/2015, 4:53 PM

## 2015-11-05 NOTE — Care Management Note (Signed)
Case Management Note  Patient Details  Name: DREGAN PALUBICKI MRN: [id number] Date of Birth: Jan 23, 1951  Subjective/Objective:                 Action/Plan: DC plan is for home with [facility] referral  placed with Arville Go by physicians office., Richrd Prime at [place] aware of plan.    Expected Discharge Date:                  Expected Discharge Plan:  [place]  In-House Referral:     Discharge planning Services  CM Consult  Post Acute Care Choice:  Durable Medical Equipment Choice offered to:  NA  DME Arranged:  Debera Lat, Bedside commode DME Agency:  [place]  [facility] Arranged:  [facility] Agency:  [facility]  Status of Service:  In process, will continue to follow  Medicare Important Message Given:    Date Medicare IM Given:    Medicare IM give by:    Date Additional Medicare IM Given:    Additional Medicare Important Message give by:     If discussed at [place] of Stay Meetings, dates discussed:    Additional Comments:  Alvie Heidelberg, RN 11/05/2015, 3:03 PM

## 2015-11-05 NOTE — Progress Notes (Signed)
OT Screen  Patient Details Name: Steven [name] MRN: [id number] DOB: 01-26-1951   OT Screen:     Reason evaluation not completed: Chart reviewed, pt screened for OT needs. Pt is minimally limited with ADL completion at this time due to pain. Pt will have support on discharge, friends available as needed to assist with ADL tasks; will receive HHPT. No further OT needs at this time.   Guadelupe Sabin, OTR/L  [phone]  11/05/2015, 9:07 AM

## 2015-11-05 NOTE — Progress Notes (Signed)
Subjective: 1 Day Post-Op Procedure(s) (LRB): RIGHT TOTAL KNEE ARTHROPLASTY (Right) Patient reports pain as moderate.    Objective: Vital signs in last 24 hours: Temp:  [97.6 F (36.4 C)-98.6 F (37 C)] 98.6 F (37 C) (06/01 0506) Pulse Rate:  [70-90] 79 (06/01 0506) Resp:  [10-18] 18 (06/01 0506) BP: (118-158)/(61-86) 158/62 mmHg (06/01 0506) SpO2:  [96 %-100 %] 98 % (06/01 0506) Weight:  [156 lb 6.4 oz (70.943 kg)] 156 lb 6.4 oz (70.943 kg) (06/01 0107)  Intake/Output from previous day: 05/31 0701 - 06/01 0700 In: 2390 [P.O.:290; I.V.:2050; IV Piggyback:50] Out: 2480 [Urine:2200; Drains:270; Blood:10] Intake/Output this shift: Total I/O In: -  Out: 2080 [Urine:2000; Drains:80]  No results for input(s): HGB in the last 72 hours. No results for input(s): WBC, RBC, HCT, PLT in the last 72 hours. No results for input(s): NA, K, CL, CO2, BUN, CREATININE, GLUCOSE, CALCIUM in the last 72 hours. No results for input(s): LABPT, INR in the last 72 hours.  Neurologically intact Neurovascular intact Sensation intact distally Intact pulses distally Dorsiflexion/Plantar flexion intact Incision: dressing C/D/I  Assessment/Plan: 1 Day Post-Op Procedure(s) (LRB): RIGHT TOTAL KNEE ARTHROPLASTY (Right) Advance diet Up with therapy D/C IV fluids  Arther Abbott 11/05/2015, 6:52 AM

## 2015-11-05 NOTE — Addendum Note (Signed)
Addendum  created 11/05/15 1046 by Charmaine Downs, CRNA   Modules edited: Clinical Notes   Clinical Notes:  File: [id number]

## 2015-11-05 NOTE — Progress Notes (Signed)
Physical Therapy Treatment Patient Details Name: Steven [name] MRN: [id number] DOB: [birth year] Today's Date: 11/05/2015    History of Present Illness 65 yo M admitted s/p R TKA.  PMH: shoulder impingement, skin CA excision, wrist surgery, R shoulder acromioplasty.     PT Comments    Pt semirecumbent in bed and willing to participate with therapy today.  Pt with high pain scale initial entrance, RN notified and pain medication given when appropriate.  Pt performed appropriate techniques with majority of exercises, verbal and tactile cueing to improve quad activation with quad sets and limited by pain with inability to complete AAROM SAQs today.  Pt instructed bed mobility to increase independence with min A with Rt LE with supine to sit.  Min guard with gait training with RW with minimal cueing to improve heel to toe pattern and equalize stance phase with gait.  No LOB episodes noted during gait training.  End of session pt left in chair with LE elevated and ice applied to knee for pain and edema control.  Pt stated knee felt better at end of session, no pain scale given.  Pt with call bell and telephone in reach.  Follow Up Recommendations        Equipment Recommendations       Recommendations for Other Services       Precautions / Restrictions Precautions Precautions: Knee Precaution Booklet Issued: No Restrictions Weight Bearing Restrictions: Yes RLE Weight Bearing: Weight bearing as tolerated    Mobility  Bed Mobility Overal bed mobility: Modified Independent             General bed mobility comments: Cueing and min A with Rt LE  Transfers Overall transfer level: Modified independent Equipment used: Rolling walker (2 wheeled) Transfers: Sit to/from Stand Sit to Stand: Min guard         General transfer comment: Cueing for hand and foot placement prior sit to stand  Ambulation/Gait Ambulation/Gait assistance: Min guard Ambulation Distance (Feet): 120  Feet Assistive device: Rolling walker (2 wheeled) Gait Pattern/deviations: Step-through pattern;Decreased stance time - right;Decreased stride length;Decreased dorsiflexion - right;Antalgic   Gait velocity interpretation: at or above normal speed for age/gender     Stairs            Wheelchair Mobility    Modified Rankin (Stroke Patients Only)       Balance                                    Cognition Arousal/Alertness: Awake/alert Behavior During Therapy: WFL for tasks assessed/performed Overall Cognitive Status: Within Functional Limits for tasks assessed                      Exercises Total Joint Exercises Ankle Circles/Pumps: AROM;Both;20 reps;Supine Quad Sets: Strengthening;Both;10 reps;Supine Gluteal Sets: Strengthening;Both;10 reps;Supine Short Arc Quad:  (Trial with SAQ, too painful to complete this session) Heel Slides: AAROM;Right;15 reps;Supine    General Comments        Pertinent Vitals/Pain Pain Assessment: 0-10 Pain Score: 10-Worst pain ever Pain Location: Rt knee- surgical Pain Intervention(s): Premedicated before session;Ice applied;Repositioned    Home Living                      Prior Function            PT Goals (current goals can now be found in the care plan section)  Acute Rehab PT Goals Patient Stated Goal: Pt wants to go home soon PT Goal Formulation: With patient Time For Goal Achievement: 11/11/15 Potential to Achieve Goals: Good Progress towards PT goals: Progressing toward goals    Frequency       PT Plan Current plan remains appropriate    Co-evaluation             End of Session Equipment Utilized During Treatment: Gait belt Activity Tolerance: Patient tolerated treatment well Patient left: in chair;with call bell/phone within reach     Time: 1025-1103 PT Time Calculation (min) (ACUTE ONLY): 38 min  Charges:  $Gait Training: 8-22 mins $Therapeutic Exercise: 8-22  mins $Therapeutic Activity: 8-22 mins                    G Codes:      Ihor Austin, [place]; CBIS [phone]  Aldona Lento 11/05/2015, 4:05 PM

## 2015-11-05 NOTE — Anesthesia Postprocedure Evaluation (Signed)
Anesthesia Post Note  Patient: Steven [name]  Procedure(s) Performed: Procedure(s) (LRB): RIGHT TOTAL KNEE ARTHROPLASTY (Right)  Patient location during evaluation: Nursing Unit Anesthesia Type: Spinal Level of consciousness: awake and alert, oriented and patient cooperative Pain management: pain level controlled Vital Signs Assessment: post-procedure vital signs reviewed and stable Respiratory status: spontaneous breathing, nonlabored ventilation and respiratory function stable Postop Assessment: no backache, no signs of nausea or vomiting and no headache Anesthetic complications: no    Last Vitals:  Filed Vitals:   11/05/15 0017 11/05/15 0506  BP: 158/70 158/62  Pulse: 90 79  Temp: 36.8 C 37 C  Resp: 18 18    Last Pain:  Filed Vitals:   11/05/15 0942  PainSc: 4                  Oceanna Arruda J

## 2015-11-05 NOTE — Clinical Social Work Note (Signed)
CSW received referral for possible SNF. PT evaluated pt yesterday and recommend home with home health. CSW will sign off, but can be reconsulted if needed.  Benay Pike, [place]

## 2015-11-06 ENCOUNTER — Encounter (HOSPITAL_COMMUNITY): Payer: Self-pay | Admitting: Orthopedic Surgery

## 2015-11-06 LAB — CBC
HCT: 33.7 % — ABNORMAL LOW (ref 39.0–52.0)
Hemoglobin: 11.7 g/dL — ABNORMAL LOW (ref 13.0–17.0)
MCH: 32.9 pg (ref 26.0–34.0)
MCHC: 34.7 g/dL (ref 30.0–36.0)
MCV: 94.7 fL (ref 78.0–100.0)
Platelets: 181 10*3/uL (ref 150–400)
RBC: 3.56 MIL/uL — ABNORMAL LOW (ref 4.22–5.81)
RDW: 13.2 % (ref 11.5–15.5)
WBC: 11.6 10*3/uL — ABNORMAL HIGH (ref 4.0–10.5)

## 2015-11-06 MED ORDER — SENNA 8.6 MG PO TABS: 1.0000 | ORAL_TABLET | Freq: Two times a day (BID) | ORAL | Status: DC

## 2015-11-06 MED ORDER — OXYCODONE HCL 15 MG PO TABS: 15.0000 mg | ORAL_TABLET | ORAL | Status: DC | PRN

## 2015-11-06 MED ORDER — METHOCARBAMOL 500 MG PO TABS: 500.0000 mg | ORAL_TABLET | Freq: Four times a day (QID) | ORAL | Status: DC | PRN

## 2015-11-06 NOTE — Progress Notes (Signed)
Physical Therapy Treatment Patient Details Name: Steven [name] MRN: [id number] DOB: [birth year] Today's Date: 11/06/2015    History of Present Illness 65 yo M admitted s/p R TKA.  PMH: shoulder impingement, skin CA excision, wrist surgery, R shoulder acromioplasty.     PT Comments    Pt was able to progress to Mod I for gait with RW today, and ambualted 117ft.  Pt continues to progress with HEP, but still lacking terminal knee extension. Pt was able to negotiate 1 step with supervision.  After return to the room it was noted that pt's bandage where the drain was removed was leaking.  RN called into the room, and dressing changed.  Pt then placed in CPM and tolerated -10-65*.  Pt is cleared for d/c home with HHPT, RW and BSC.    Follow Up Recommendations  Home health PT;Supervision - Intermittent     Equipment Recommendations  Rolling walker with 5" wheels;3in1 (PT)    Recommendations for Other Services       Precautions / Restrictions Precautions Precautions: Knee Restrictions RLE Weight Bearing: Weight bearing as tolerated    Mobility  Bed Mobility Overal bed mobility: Modified Independent                Transfers Overall transfer level: Modified independent Equipment used: Rolling walker (2 wheeled) Transfers: Sit to/from Stand Sit to Stand: Modified independent (Device/Increase time)            Ambulation/Gait Ambulation/Gait assistance: Modified independent (Device/Increase time) Ambulation Distance (Feet): 150 Feet Assistive device: Rolling walker (2 wheeled) Gait Pattern/deviations: Step-through pattern;Decreased stance time - right;Decreased stride length;Antalgic     General Gait Details: Noted decreased weight bearing through UE's today.    Stairs Stairs: Yes Stairs assistance: Supervision Stair Management: No rails;Backwards Number of Stairs: 1 General stair comments: Educated pt on how to go up/down his threshold step using the RW and  going fwd at home.   Wheelchair Mobility    Modified Rankin (Stroke Patients Only)       Balance           Standing balance support: Bilateral upper extremity supported Standing balance-Leahy Scale: Fair                      Cognition Arousal/Alertness: Awake/alert Behavior During Therapy: WFL for tasks assessed/performed Overall Cognitive Status: Within Functional Limits for tasks assessed                      Exercises Total Joint Exercises Ankle Circles/Pumps: AROM;Both;10 reps;Supine Quad Sets: Strengthening;Both;10 reps;Other (comment);Supine (cue for pt to place hands on quads to feel strength of contraction. ) Gluteal Sets: Strengthening;Both;10 reps;Supine Short Arc Quad: Strengthening;Right;10 reps;Supine;Other (comment) (limited range due to weakness) Heel Slides: Strengthening;Right;10 reps;Supine    General Comments        Pertinent Vitals/Pain Pain Assessment: 0-10 Pain Score: 8  Pain Location: R knee - surgical Pain Intervention(s): Premedicated before session;Repositioned;Ice applied    Home Living                      Prior Function            PT Goals (current goals can now be found in the care plan section) Acute Rehab PT Goals Patient Stated Goal: Pt wants to go home soon PT Goal Formulation: With patient Time For Goal Achievement: 11/11/15 Potential to Achieve Goals: Good Progress towards PT goals: Progressing toward goals  Frequency  BID    PT Plan Current plan remains appropriate    Co-evaluation             End of Session Equipment Utilized During Treatment: Gait belt Activity Tolerance: Patient tolerated treatment well Patient left: in bed;with call bell/phone within reach;in CPM     Time: [id number] PT Time Calculation (min) (ACUTE ONLY): 51 min  Charges:  $Gait Training: 8-22 mins $Therapeutic Exercise: 8-22 mins $Therapeutic Activity: 8-22 mins                    G Codes:       Beth Chizuko Trine, PT, DPT X: [id number]   11/06/2015, 12:54 PM

## 2015-11-06 NOTE — Care Management Note (Signed)
Case Management Note  Patient Details  Name: NASIAH CAPONE MRN: [id number] Date of Birth: [birth year]  Subjective/Objective:    Spoke with patient for continued discharge plan. Alert oriented from home with friend. Has transportation and denies issues with medications.  CPM ,  Roling Walker and BSC/3-1 delivered prior to discharge. Time [place] Go) notified of impending discharge today.             Action/Plan: Home with HH.    Expected Discharge Date:                  Expected Discharge Plan:  [place]  In-House Referral:     Discharge planning Services  CM Consult  Post Acute Care Choice:  Durable Medical Equipment Choice offered to:  NA  DME Arranged:  Debera Lat, Bedside commode DME Agency:  [place]:  PT [place]:  [facility]  Status of Service:  Completed, signed off  Medicare Important Message Given:  Yes Date Medicare IM Given:    Medicare IM give by:    Date Additional Medicare IM Given:    Additional Medicare Important Message give by:     If discussed at [place] of Stay Meetings, dates discussed:    Additional Comments:  Alvie Heidelberg, RN 11/06/2015, 8:59 AM

## 2015-11-06 NOTE — Care Management Important Message (Signed)
Important Message  Patient Details  Name: Steven [name] MRN: [id number] Date of Birth: April 07, 1951   Medicare Important Message Given:  Yes    Alvie Heidelberg, RN 11/06/2015, 8:31 AM

## 2015-11-06 NOTE — Progress Notes (Signed)
Patient alert and oriented, independent, VSS, pt. Tolerating diet well. No complaints of pain or nausea. Pt. Had IV removed tip intact. Pt. Had prescriptions given. Pt. Voiced understanding of discharge instructions with no further questions. Pt. Discharged via wheelchair with auxilliary.  

## 2015-11-06 NOTE — Discharge Summary (Signed)
  Physician Discharge Summary  Patient ID: Steven [name] MRN: [id number] DOB/AGE: [birth year] 65 y.o.  Admit date: 11/04/2015 Discharge date: 11/06/2015  Admission Diagnoses: oa right knee   Discharge Diagnoses: oa right knee  Active Problems:   Osteoarthritis   Primary osteoarthritis of knee   Discharged Condition: good  Hospital Course:  5/30 admit for tka with spinal, attune tka  5/31 tolerated PT (no cpm)  6/1 stable for discharge     Discharge Exam: Blood pressure 149/63, pulse 81, temperature 98 F (36.7 C), temperature source Oral, resp. rate 20, height 5\' 8"  (1.727 m), weight 156 lb 6.4 oz (70.943 kg), SpO2 98 %. Dressing dry   Disposition: 01-Home or Self Care  Discharge Instructions    Change dressing    Complete by:  As directed   Change dressing 6.9.2017 Ok to shower until dressing changed     Diet - low sodium heart healthy    Complete by:  As directed      For home use only DME Continuous passive motion machine    Complete by:  As directed      Increase activity slowly    Complete by:  As directed             Medication List    STOP taking these medications        oxyCODONE-acetaminophen 5-325 MG tablet  Commonly known as:  PERCOCET/ROXICET      TAKE these medications        cyclobenzaprine 10 MG tablet  Commonly known as:  FLEXERIL  Take 1 tablet (10 mg total) by mouth 2 (two) times daily as needed for muscle spasms.     methocarbamol 500 MG tablet  Commonly known as:  ROBAXIN  Take 1 tablet (500 mg total) by mouth every 6 (six) hours as needed for muscle spasms.     oxyCODONE 15 MG immediate release tablet  Commonly known as:  ROXICODONE  Take 1 tablet (15 mg total) by mouth every 4 (four) hours as needed for pain.     senna 8.6 MG Tabs tablet  Commonly known as:  SENOKOT  Take 1 tablet (8.6 mg total) by mouth 2 (two) times daily.           Follow-up Information    Follow up with Arther Abbott, MD.   Specialties:   Orthopedic Surgery, Radiology   Contact information:   [address] [place] [address] [phone]       Signed: Arther Abbott 11/06/2015, 8:31 AM

## 2015-11-07 DIAGNOSIS — Z471 Aftercare following joint replacement surgery: Secondary | ICD-10-CM | POA: Diagnosis not present

## 2015-11-07 DIAGNOSIS — Z96651 Presence of right artificial knee joint: Secondary | ICD-10-CM | POA: Diagnosis not present

## 2015-11-07 DIAGNOSIS — F17290 Nicotine dependence, other tobacco product, uncomplicated: Secondary | ICD-10-CM | POA: Diagnosis not present

## 2015-11-09 ENCOUNTER — Telehealth: Payer: Self-pay | Admitting: Orthopedic Surgery

## 2015-11-09 DIAGNOSIS — Z96651 Presence of right artificial knee joint: Secondary | ICD-10-CM | POA: Diagnosis not present

## 2015-11-09 DIAGNOSIS — Z471 Aftercare following joint replacement surgery: Secondary | ICD-10-CM | POA: Diagnosis not present

## 2015-11-09 DIAGNOSIS — F17290 Nicotine dependence, other tobacco product, uncomplicated: Secondary | ICD-10-CM | POA: Diagnosis not present

## 2015-11-09 NOTE — Telephone Encounter (Signed)
Per Anne Ng, Physical Therapist from [place], Steven [name] states his pain is a 10 out of 10.  She states that he is taking his pain medication.  She also states that she told him to elevate his leg, not just try to hold it out in front of him and use the ice.  She said that Steven [name]' vitals were good with no fever, good range but complains of a lot of pain.  She just wanted to let you know.

## 2015-11-09 NOTE — Telephone Encounter (Signed)
Returned call, no answer, left vm 

## 2015-11-09 NOTE — Telephone Encounter (Signed)
Please call Andee Poles, PT from [place] at [phone]

## 2015-11-09 NOTE — Telephone Encounter (Signed)
He has chronic pain and narcotic tolerance

## 2015-11-09 NOTE — Telephone Encounter (Signed)
Four times weekly x 1 week, verbal order given per request

## 2015-11-10 DIAGNOSIS — Z471 Aftercare following joint replacement surgery: Secondary | ICD-10-CM | POA: Diagnosis not present

## 2015-11-10 DIAGNOSIS — F17290 Nicotine dependence, other tobacco product, uncomplicated: Secondary | ICD-10-CM | POA: Diagnosis not present

## 2015-11-10 DIAGNOSIS — Z96651 Presence of right artificial knee joint: Secondary | ICD-10-CM | POA: Diagnosis not present

## 2015-11-10 LAB — TYPE AND SCREEN
ABO/RH(D): A POS
Antibody Screen: NEGATIVE
Unit division: 0
Unit division: 0

## 2015-11-11 DIAGNOSIS — Z96651 Presence of right artificial knee joint: Secondary | ICD-10-CM | POA: Diagnosis not present

## 2015-11-11 DIAGNOSIS — F17290 Nicotine dependence, other tobacco product, uncomplicated: Secondary | ICD-10-CM | POA: Diagnosis not present

## 2015-11-11 DIAGNOSIS — Z471 Aftercare following joint replacement surgery: Secondary | ICD-10-CM | POA: Diagnosis not present

## 2015-11-12 ENCOUNTER — Telehealth: Payer: Self-pay | Admitting: *Deleted

## 2015-11-12 ENCOUNTER — Other Ambulatory Visit: Payer: Self-pay | Admitting: Orthopedic Surgery

## 2015-11-12 DIAGNOSIS — F17290 Nicotine dependence, other tobacco product, uncomplicated: Secondary | ICD-10-CM | POA: Diagnosis not present

## 2015-11-12 DIAGNOSIS — Z471 Aftercare following joint replacement surgery: Secondary | ICD-10-CM | POA: Diagnosis not present

## 2015-11-12 DIAGNOSIS — Z96651 Presence of right artificial knee joint: Secondary | ICD-10-CM | POA: Diagnosis not present

## 2015-11-12 MED ORDER — OXYCODONE HCL 15 MG PO TABS: 15.0000 mg | ORAL_TABLET | ORAL | Status: DC | PRN

## 2015-11-12 NOTE — Telephone Encounter (Signed)
PATIENT REQUEST REFILL ON OXYCODONE 15 MG # 60, WILL BE OUT THE WEEK END

## 2015-11-16 ENCOUNTER — Encounter: Payer: Self-pay | Admitting: Orthopedic Surgery

## 2015-11-16 ENCOUNTER — Ambulatory Visit (INDEPENDENT_AMBULATORY_CARE_PROVIDER_SITE_OTHER): Payer: Medicare Other | Admitting: Orthopedic Surgery

## 2015-11-16 VITALS — BP 142/73 | HR 83 | Resp 16 | Ht 67.0 in | Wt 151.6 lb

## 2015-11-16 DIAGNOSIS — Z4789 Encounter for other orthopedic aftercare: Secondary | ICD-10-CM

## 2015-11-16 MED ORDER — CYCLOBENZAPRINE HCL 10 MG PO TABS: 10.0000 mg | ORAL_TABLET | Freq: Two times a day (BID) | ORAL | Status: DC | PRN

## 2015-11-16 NOTE — Progress Notes (Signed)
Post op tka   Right tka  Chief Complaint  Patient presents with  . Follow-up    Post OP Rt TKA  Patient states knee is still painful.   BP 142/73 mmHg  Pulse 83  Resp 16  Ht 5\' 7"  (1.702 m)  Wt 151 lb 9.6 oz (68.765 kg)  BMI 23.74 kg/m2  Incision is clean dry intact and the staples are taken out   He has [place] so he has to most of the therapy himself    Current outpatient prescriptions:  .  cyclobenzaprine (FLEXERIL) 10 MG tablet, Take 1 tablet (10 mg total) by mouth 2 (two) times daily as needed for muscle spasms., Disp: 20 tablet, Rfl: 0 .  methocarbamol (ROBAXIN) 500 MG tablet, Take 1 tablet (500 mg total) by mouth every 6 (six) hours as needed for muscle spasms., Disp: 60 tablet, Rfl: 1 .  oxyCODONE (ROXICODONE) 15 MG immediate release tablet, Take 1 tablet (15 mg total) by mouth every 4 (four) hours as needed for pain., Disp: 60 tablet, Rfl: 0 .  senna (SENOKOT) 8.6 MG TABS tablet, Take 1 tablet (8.6 mg total) by mouth 2 (two) times daily., Disp: 120 each, Rfl: 0  Robaxin caused him to have heart palpitations so we stopped and put him back on Flexeril  He has 90 of knee flexion  He's walking with a walker  I emphasized that he cannot get a normal amount of therapy visits so he will have to do the exercises himself a see him again in 2 weeks he has pain medication to last for several more days.

## 2015-11-19 ENCOUNTER — Telehealth: Payer: Self-pay | Admitting: Orthopedic Surgery

## 2015-11-19 ENCOUNTER — Other Ambulatory Visit: Payer: Self-pay | Admitting: Orthopedic Surgery

## 2015-11-19 MED ORDER — CYCLOBENZAPRINE HCL 10 MG PO TABS: 10.0000 mg | ORAL_TABLET | Freq: Two times a day (BID) | ORAL | Status: DC | PRN

## 2015-11-19 NOTE — Telephone Encounter (Signed)
ROUTING TO DR HARRISON FOR APPROVAL 

## 2015-11-19 NOTE — Telephone Encounter (Signed)
Patient requested prescription for Flexeril.  It looks like this was done on 11-16-15 but according to the pt, [facility] states they did not receive a prescription for this.

## 2015-11-25 ENCOUNTER — Telehealth: Payer: Self-pay | Admitting: Orthopedic Surgery

## 2015-11-25 NOTE — Telephone Encounter (Signed)
Oxycodone( Roxicodone) 15 mg immediate release tablet  Qty 60 Tablets  Take 1 tablet (15 mg) by mouth every 4 (four) hours as needed for pain.

## 2015-11-26 ENCOUNTER — Other Ambulatory Visit: Payer: Self-pay | Admitting: Orthopedic Surgery

## 2015-11-26 MED ORDER — OXYCODONE HCL 15 MG PO TABS: 15.0000 mg | ORAL_TABLET | ORAL | Status: DC | PRN

## 2015-11-26 NOTE — Telephone Encounter (Signed)
Routing to Dr. Harrison to advise 

## 2015-11-30 ENCOUNTER — Ambulatory Visit (INDEPENDENT_AMBULATORY_CARE_PROVIDER_SITE_OTHER): Payer: Medicare Other | Admitting: Orthopedic Surgery

## 2015-11-30 VITALS — BP 146/56 | HR 86 | Ht 67.0 in | Wt 151.0 lb

## 2015-11-30 DIAGNOSIS — Z4789 Encounter for other orthopedic aftercare: Secondary | ICD-10-CM

## 2015-11-30 DIAGNOSIS — Z96651 Presence of right artificial knee joint: Secondary | ICD-10-CM

## 2015-11-30 MED ORDER — OXYCODONE HCL 15 MG PO TABS: 15.0000 mg | ORAL_TABLET | ORAL | Status: DC | PRN

## 2015-11-30 NOTE — Progress Notes (Signed)
Chief Complaint  Patient presents with  . Follow-up    2 week recheck on right TKA, DOS 11-04-15.    4 weeks postop right total knee knee flexion is 100 he still work on knee extension. I gave him a new exercise to do to help with that. We refilled his Roxicodone and I'll see him on 24th July

## 2015-11-30 NOTE — Patient Instructions (Signed)
WORK ON EXTENSION

## 2015-12-16 ENCOUNTER — Telehealth: Payer: Self-pay | Admitting: *Deleted

## 2015-12-16 MED ORDER — OXYCODONE HCL 15 MG PO TABS: 15.0000 mg | ORAL_TABLET | ORAL | Status: DC | PRN

## 2015-12-16 NOTE — Telephone Encounter (Signed)
Patient request refill on oxyCODONE (ROXICODONE) 15 MG immediate release tablet QTY 60 He is a patient of Dr. Aline Brochure

## 2015-12-16 NOTE — Telephone Encounter (Signed)
Rx done. 

## 2015-12-28 ENCOUNTER — Ambulatory Visit: Payer: Medicare Other | Admitting: Orthopedic Surgery

## 2015-12-28 ENCOUNTER — Encounter: Payer: Self-pay | Admitting: Orthopedic Surgery

## 2015-12-28 VITALS — BP 116/67 | Ht 67.0 in | Wt 154.0 lb

## 2015-12-28 DIAGNOSIS — Z96651 Presence of right artificial knee joint: Secondary | ICD-10-CM

## 2015-12-28 DIAGNOSIS — Z4789 Encounter for other orthopedic aftercare: Secondary | ICD-10-CM

## 2015-12-28 MED ORDER — OXYCODONE HCL 10 MG PO TABS: 10.0000 mg | ORAL_TABLET | ORAL | 0 refills | Status: DC | PRN

## 2015-12-28 NOTE — Progress Notes (Signed)
Patient ID: Steven [name], male   DOB: Jun 23, 1950, 66 y.o.   MRN: [id number]  Follow up visit  Chief Complaint  Patient presents with  . Follow-up    right knee post op   11/04/2015/DOS  BP 116/67   Ht 5\' 7"  (1.702 m)   Wt 154 lb (69.9 kg)   BMI 24.12 kg/m   HE IS AWARE THAT WE ARE WEANING HIM FROM 15 MG TO 10 THEN TO 5 MG OF OXYCODONE.  His pain has improved from postop and we are starting the weaning process.  I gave him a prescription to last 1 month. I think this is appropriate based on his use of opioid and his self-directed physical therapy and prior history of opioid use oxycodone 5 mg prior to surgery.   Knee flexion is 108 and the extension is 10  He will continue to work on flexion extension at home and come back in a month No diagnosis found.  3:10 PM Arther Abbott, MD 12/28/2015

## 2016-01-27 ENCOUNTER — Telehealth: Payer: Self-pay | Admitting: Orthopedic Surgery

## 2016-01-27 ENCOUNTER — Other Ambulatory Visit: Payer: Self-pay | Admitting: *Deleted

## 2016-01-27 MED ORDER — HYDROCODONE-ACETAMINOPHEN 7.5-325 MG PO TABS: 1.0000 | ORAL_TABLET | Freq: Four times a day (QID) | ORAL | 0 refills | Status: DC | PRN

## 2016-01-27 NOTE — Telephone Encounter (Signed)
Routing to Dr Harrison for approval 

## 2016-01-27 NOTE — Telephone Encounter (Signed)
Has to cut to 7.5 # 60

## 2016-01-27 NOTE — Telephone Encounter (Signed)
Patient called to request refill of:  Oxycodone HCl 10 MG TABS 180 tablet   His next appointment is 02/01/16; states he'll be out after today.

## 2016-01-27 NOTE — Progress Notes (Unsigned)
Steven [name]

## 2016-01-30 ENCOUNTER — Encounter (HOSPITAL_COMMUNITY): Payer: Self-pay | Admitting: Emergency Medicine

## 2016-01-30 ENCOUNTER — Emergency Department (HOSPITAL_COMMUNITY)
Admission: EM | Admit: 2016-01-30 | Discharge: 2016-01-30 | Disposition: A | Discharge status: Home / Self Care | Payer: Medicare Other | Attending: Emergency Medicine | Admitting: Emergency Medicine

## 2016-01-30 DIAGNOSIS — F17210 Nicotine dependence, cigarettes, uncomplicated: Secondary | ICD-10-CM | POA: Insufficient documentation

## 2016-01-30 DIAGNOSIS — W57XXXA Bitten or stung by nonvenomous insect and other nonvenomous arthropods, initial encounter: Secondary | ICD-10-CM | POA: Diagnosis not present

## 2016-01-30 DIAGNOSIS — Y999 Unspecified external cause status: Secondary | ICD-10-CM | POA: Insufficient documentation

## 2016-01-30 DIAGNOSIS — S30863A Insect bite (nonvenomous) of scrotum and testes, initial encounter: Secondary | ICD-10-CM | POA: Diagnosis not present

## 2016-01-30 DIAGNOSIS — Y929 Unspecified place or not applicable: Secondary | ICD-10-CM | POA: Insufficient documentation

## 2016-01-30 DIAGNOSIS — Y939 Activity, unspecified: Secondary | ICD-10-CM | POA: Insufficient documentation

## 2016-01-30 MED ORDER — DOXYCYCLINE HYCLATE 100 MG PO CAPS
100.0000 mg | ORAL_CAPSULE | Freq: Two times a day (BID) | ORAL | 0 refills | Status: DC
Start: 2016-01-30 — End: 2016-08-01

## 2016-01-30 NOTE — ED Notes (Signed)
This nurse present during PA's assessment of pt's scrotum due to tick bites.

## 2016-01-30 NOTE — ED Triage Notes (Signed)
Pt admits to ED w/ c/o of fatigue, chills-r/t tick bite on scrotum. Tick removed per pt.

## 2016-01-30 NOTE — ED Notes (Signed)
Denies fevers but + chills since pt removed 4 ticks from scrotum about a week ago, denies any pain.  denies N/V but + fatigue but able to daily ADL's

## 2016-01-30 NOTE — Discharge Instructions (Signed)
Follow up with your doctor in one week for recheck.  You can apply 1 % hydrocortisone cream 3 times a day to the bites if needed.

## 2016-01-30 NOTE — ED Provider Notes (Signed)
[facility] DEPT Provider Note   CSN: [id number] Arrival date & time: 01/30/16  1213     History   Chief Complaint Chief Complaint  Patient presents with  . Insect Bite    Tick    HPI Steven [name] is a 65 y.o. male.  HPI   Steven [name] is a 65 y.o. male who presents to the Emergency Department complaining of fatigue, chills, myalgias for 2 days.  He states that he recently removed 4 ticks from his scrotum one week ago.  He denies joint pains, rash, headaches, fever, and abdominal pain or vomiting.     Past Medical History:  Diagnosis Date  . Cancer ([place])    skin  . Shoulder impingement 2004    Patient Active Problem List   Diagnosis Date Noted  . Primary osteoarthritis of knee 11/04/2015  . Osteoarthritis   . Right knee pain 07/09/2015  . Right shoulder pain 07/09/2015    Past Surgical History:  Procedure Laterality Date  . FRACTURE SURGERY Left    wrist  . SHOULDER ACROMIOPLASTY Right 11/20/2012   Procedure: SHOULDER ACROMIOPLASTY;  Surgeon: Sanjuana Kava, MD;  Location: [facility];  Service: Orthopedics;  Laterality: Right;  . SKIN CANCER EXCISION Left 2013   wrist surgery fell off a roof  . STOMACH SURGERY     had bleeding ulcers repaired.  Marland Kitchen TOTAL KNEE ARTHROPLASTY Right 11/04/2015   Procedure: RIGHT TOTAL KNEE ARTHROPLASTY;  Surgeon: Carole Civil, MD;  Location: [facility];  Service: Orthopedics;  Laterality: Right;       Home Medications    Prior to Admission medications   Medication Sig Start Date End Date Taking? Authorizing Provider  cyclobenzaprine (FLEXERIL) 10 MG tablet Take 1 tablet (10 mg total) by mouth 2 (two) times daily as needed for muscle spasms. 11/19/15   Carole Civil, MD  doxycycline (VIBRAMYCIN) 100 MG capsule Take 1 capsule (100 mg total) by mouth 2 (two) times daily. For 14 days 01/30/16   Allyna Pittsley, PA-C  HYDROcodone-acetaminophen (NORCO) 7.5-325 MG tablet Take 1 tablet by mouth every 6 (six) hours as needed  for moderate pain. 01/27/16   Carole Civil, MD  methocarbamol (ROBAXIN) 500 MG tablet Take 1 tablet (500 mg total) by mouth every 6 (six) hours as needed for muscle spasms. 11/06/15   Carole Civil, MD  oxyCODONE (ROXICODONE) 15 MG immediate release tablet Take 1 tablet (15 mg total) by mouth every 4 (four) hours as needed for pain. 12/16/15   Sanjuana Kava, MD  Oxycodone HCl 10 MG TABS Take 1 tablet (10 mg total) by mouth every 4 (four) hours as needed. 12/28/15   Carole Civil, MD  senna (SENOKOT) 8.6 MG TABS tablet Take 1 tablet (8.6 mg total) by mouth 2 (two) times daily. 11/06/15   Carole Civil, MD    Family History History reviewed. No pertinent family history.  Social History Social History  Substance Use Topics  . Smoking status: Current Every Day Smoker    Packs/day: 1.00    Years: 30.00    Types: Cigars  . Smokeless tobacco: Never Used  . Alcohol use No     Allergies   Review of patient's allergies indicates no known allergies.   Review of Systems Review of Systems  Constitutional: Positive for appetite change, chills and fatigue. Negative for fever.  HENT: Negative for sore throat and trouble swallowing.   Respiratory: Negative for cough, chest tightness and shortness of breath.  Cardiovascular: Negative for chest pain.  Gastrointestinal: Negative for abdominal pain, nausea and vomiting.  Genitourinary: Negative for difficulty urinating, dysuria, penile pain, penile swelling, scrotal swelling and testicular pain.  Musculoskeletal: Negative for arthralgias and joint swelling.  Skin: Positive for color change.       Abscess   Neurological: Negative for dizziness, weakness and headaches.  Hematological: Negative for adenopathy.  All other systems reviewed and are negative.    Physical Exam Updated Vital Signs BP 147/74 (BP Location: Left Arm)   Pulse 80   Temp 97.6 F (36.4 C) (Oral)   Resp 16   Ht 5\' 7"  (1.702 m)   Wt 72.6 kg   SpO2 99%    BMI 25.06 kg/m   Physical Exam  Constitutional: He is oriented to person, place, and time. He appears well-developed and well-nourished. No distress.  HENT:  Head: Atraumatic.  Mouth/Throat: Oropharynx is clear and moist.  Eyes: Conjunctivae are normal.  Neck: Normal range of motion.  Cardiovascular: Normal rate, regular rhythm and intact distal pulses.   Pulmonary/Chest: Effort normal and breath sounds normal. No respiratory distress.  Abdominal: Soft. He exhibits no distension and no mass. There is no tenderness. There is no rebound and no guarding.  Genitourinary: Testes normal. Right testis shows no swelling and no tenderness. Left testis shows no swelling and no tenderness. Circumcised.  Genitourinary Comments: Examined the scrotum with nursing chaperone present.  Four small slightly erythema papules to the mons pubis and scrotum.  No edema or testicular tenderness.    Musculoskeletal: Normal range of motion.  Lymphadenopathy:    He has no cervical adenopathy. No inguinal adenopathy noted on the right or left side.  Neurological: He is alert and oriented to person, place, and time.  Skin: Skin is warm and dry. No rash noted. No erythema.  Nursing note and vitals reviewed.    ED Treatments / Results  Labs (all labs ordered are listed, but only abnormal results are displayed) Labs Reviewed - No data to display  EKG  EKG Interpretation None       Radiology No results found.  Procedures Procedures (including critical care time)  Medications Ordered in ED Medications - No data to display   Initial Impression / Assessment and Plan / ED Course  I have reviewed the triage vital signs and the nursing notes.  Pertinent labs & imaging results that were available during my care of the patient were reviewed by me and considered in my medical decision making (see chart for details).  Clinical Course    Pt with previous ticks bites.  Non-toxic appearing.  Will start doxy  and he agrees to PMD f/u for recheck.   Final Clinical Impressions(s) / ED Diagnoses   Final diagnoses:  Tick bite    New Prescriptions Discharge Medication List as of 01/30/2016 12:56 PM    START taking these medications   Details  doxycycline (VIBRAMYCIN) 100 MG capsule Take 1 capsule (100 mg total) by mouth 2 (two) times daily. For 14 days, Starting Sat 01/30/2016, Print         Kem Parkinson, [place] 01/30/16 1656    Merrily Pew, MD 01/31/16 1540

## 2016-01-30 NOTE — ED Notes (Signed)
Instructed pt to take all of antibiotics as prescribed. 

## 2016-02-01 ENCOUNTER — Ambulatory Visit (INDEPENDENT_AMBULATORY_CARE_PROVIDER_SITE_OTHER): Payer: Medicare Other | Admitting: Orthopedic Surgery

## 2016-02-01 ENCOUNTER — Encounter: Payer: Self-pay | Admitting: Orthopedic Surgery

## 2016-02-01 VITALS — BP 155/86 | HR 94 | Ht 68.0 in | Wt 151.0 lb

## 2016-02-01 DIAGNOSIS — G894 Chronic pain syndrome: Secondary | ICD-10-CM

## 2016-02-01 MED ORDER — OXYCODONE-ACETAMINOPHEN 5-325 MG PO TABS: 1.0000 | ORAL_TABLET | ORAL | 0 refills | Status: DC | PRN

## 2016-02-01 NOTE — Progress Notes (Signed)
FOLLOW UP Chief Complaint  Patient presents with  . Follow-up    RT TKA, DOS 11/04/15   Postop total knee doing well. However he has various aches and pains that he had prior to surgery. He is previously on Percocet 5 mg and we will return him to that and make a referral to pain clinic  His knee looks great incision healed well his knee range of motion is 3-125  Knee is stable. He walks with no supportive devices  Follow-up in 9 months for his annual x-ray  Neck referral to pain management  Meds ordered this encounter  Medications  . oxyCODONE-acetaminophen (PERCOCET/ROXICET) 5-325 MG tablet    Sig: Take 1 tablet by mouth every 4 (four) hours as needed for severe pain. RETURNED TO DOSE PRIOR TO SURGERY    Dispense:  180 tablet    Refill:  0

## 2016-02-15 ENCOUNTER — Telehealth: Payer: Self-pay | Admitting: Orthopedic Surgery

## 2016-02-15 NOTE — Telephone Encounter (Signed)
Patient called to let you know that he has only Medicare for insurance. The Medicaid was canceled, according to what he was told when he went to [place] and checked on it. He wanted to be sure Dr. Aline Brochure was aware since he was referring him to a pain clinic.

## 2016-03-01 ENCOUNTER — Telehealth: Payer: Self-pay | Admitting: Orthopedic Surgery

## 2016-03-01 ENCOUNTER — Other Ambulatory Visit: Payer: Self-pay | Admitting: *Deleted

## 2016-03-01 MED ORDER — OXYCODONE-ACETAMINOPHEN 5-325 MG PO TABS: 1.0000 | ORAL_TABLET | ORAL | 0 refills | Status: DC | PRN

## 2016-03-01 NOTE — Telephone Encounter (Signed)
ROUTING TO DR HARRISON FOR APPROVAL 

## 2016-03-01 NOTE — Telephone Encounter (Signed)
CHECK TO SEE IF HE IS DUE   IF SO 1 WEEK SUPPLY

## 2016-03-01 NOTE — Telephone Encounter (Signed)
Patient called for refill:  oxyCODONE-acetaminophen (PERCOCET/ROXICET) 5-325 MG tablet    - last received quantity 180.  Insurance is Medicare and [place] Medicaid.     - patient said also awaiting referral appointment to pain management

## 2016-03-09 ENCOUNTER — Telehealth: Payer: Self-pay | Admitting: Orthopedic Surgery

## 2016-03-09 NOTE — Telephone Encounter (Signed)
Yes and make referral to pain managemnt

## 2016-03-09 NOTE — Telephone Encounter (Signed)
Patient requests a refill on Oxycodone/Acetaminophen (Percocet) 5-325 mgs.  Qty 42  Sig: Take 1 tablet by mouth every 4 (four) hours as needed for severe pain.

## 2016-03-09 NOTE — Telephone Encounter (Signed)
Routing to DR. Harrison to advise 

## 2016-03-14 ENCOUNTER — Other Ambulatory Visit: Payer: Self-pay | Admitting: *Deleted

## 2016-03-14 MED ORDER — OXYCODONE-ACETAMINOPHEN 5-325 MG PO TABS: 1.0000 | ORAL_TABLET | ORAL | 0 refills | Status: DC | PRN

## 2016-03-23 ENCOUNTER — Telehealth: Payer: Self-pay | Admitting: Orthopedic Surgery

## 2016-03-23 NOTE — Telephone Encounter (Signed)
REFERRAL WAS DECLINED BY PAIN CLINIC AND DR HARRISON WILL NOT PRESCRIBE ANY MORE NARCOTIC PAIN MEDICATION

## 2016-03-23 NOTE — Telephone Encounter (Signed)
PATIENT AWARE THAT REFERRAL DECLINED AND DR HARRISON NO LONGER ABLE TO WRITE PRESCRIPTION, ADVISED TO FOLLOW UP WITH PCP FURTHER REFERRALS

## 2016-03-23 NOTE — Telephone Encounter (Signed)
Patient called for refill:  oxyCODONE-acetaminophen (PERCOCET/ROXICET) 5-325 MG tablet 42 tablet    - patient states has "never heard anything" about the referral to pain management, however, status of referral shows "Denied."  Please advise.

## 2016-06-24 DIAGNOSIS — Z0000 Encounter for general adult medical examination without abnormal findings: Secondary | ICD-10-CM | POA: Diagnosis not present

## 2016-06-24 DIAGNOSIS — Z79899 Other long term (current) drug therapy: Secondary | ICD-10-CM | POA: Diagnosis not present

## 2016-06-24 DIAGNOSIS — M1990 Unspecified osteoarthritis, unspecified site: Secondary | ICD-10-CM | POA: Diagnosis not present

## 2016-06-24 DIAGNOSIS — R945 Abnormal results of liver function studies: Secondary | ICD-10-CM | POA: Diagnosis not present

## 2016-06-24 DIAGNOSIS — I10 Essential (primary) hypertension: Secondary | ICD-10-CM | POA: Diagnosis not present

## 2016-06-24 DIAGNOSIS — F17200 Nicotine dependence, unspecified, uncomplicated: Secondary | ICD-10-CM | POA: Diagnosis not present

## 2016-06-24 DIAGNOSIS — M549 Dorsalgia, unspecified: Secondary | ICD-10-CM | POA: Diagnosis not present

## 2016-07-05 ENCOUNTER — Encounter: Payer: Self-pay | Admitting: Gastroenterology

## 2016-07-05 DIAGNOSIS — K739 Chronic hepatitis, unspecified: Secondary | ICD-10-CM | POA: Diagnosis not present

## 2016-07-26 ENCOUNTER — Ambulatory Visit: Payer: Medicare Other | Admitting: Gastroenterology

## 2016-08-01 ENCOUNTER — Encounter: Payer: Self-pay | Admitting: Gastroenterology

## 2016-08-01 ENCOUNTER — Ambulatory Visit (INDEPENDENT_AMBULATORY_CARE_PROVIDER_SITE_OTHER): Payer: Medicare Other | Admitting: Gastroenterology

## 2016-08-01 DIAGNOSIS — Z1211 Encounter for screening for malignant neoplasm of colon: Secondary | ICD-10-CM | POA: Diagnosis not present

## 2016-08-01 DIAGNOSIS — B182 Chronic viral hepatitis C: Secondary | ICD-10-CM

## 2016-08-01 NOTE — Assessment & Plan Note (Signed)
In the future we will plan for colon cancer screening via colonoscopy. Patient wants to pursue HCV treatment first.

## 2016-08-01 NOTE — Assessment & Plan Note (Signed)
66 year old gentleman with chronic hepatitis C, treatment nave. Recent diagnosis. Likely exposure IV drug use versus blood transfusion prior to the 90s. Denies ongoing alcohol abuse. Recommend alcohol cessation. Denies illicit drug use. Obtain labs and abdominal ultrasound with elastography to determine HCV treatment plan. Discussed need for complete compliance once treatment started. Discussed that he needs to approve any new medications whether prescription or over-the-counter with [place] prior to proceeding. When he starts treatment we will need to address when necessary acid reflux medications options with him. He will avoid sharing razor blades, nail clippers, toothbrushes, use barrier protection with sexual activity.

## 2016-08-01 NOTE — Progress Notes (Signed)
Primary Care Physician:  Rosita Fire, MD  Primary Gastroenterologist:  Barney Drain, MD   Chief Complaint  Patient presents with  . Hepatitis C    HPI:  Steven [name] is a 66 y.o. male here At the request of Dr. Legrand Rams further management of chronic hepatitis C. Recent labs indicated positive hepatitis C antibody, HCV RNA was positive as well. Patient believes his exposure came from remote IV drug use or blood transfusions he received when he had a bleeding ulcer at age 23. Recent HIV testing was negative. We do not have hepatitis B testing available. Patient has a history of alcohol dependency, previously drank daily about 10 years ago. Patient states over the past year he has drank on occasion but not daily. He is currently on Suboxone after developing dependency to oxycodone. He was provided pain medication after having knee replacement, shoulder surgery and back surgery became dependent.  He has occasional heartburn for which he takes over-the-counter Prevacid. Denies any vomiting, appetite concerns. Intermittent right upper quadrant pain which he notes if he sits for prolonged period of time. Unrelated to meals. Bowel function is regular. No blood in the stool or melena.   No recent EGD. No TCS.     Current Outpatient Prescriptions  Medication Sig Dispense Refill  . cloNIDine (CATAPRES) 0.1 MG tablet Take 0.1 mg by mouth at bedtime.    . hydrOXYzine (ATARAX/VISTARIL) 25 MG tablet Take 25 mg by mouth at bedtime.    Marland Kitchen lisinopril-hydrochlorothiazide (PRINZIDE,ZESTORETIC) 10-12.5 MG tablet Take 1 tablet by mouth daily.    . SUBOXONE 8-2 MG FILM Take 1.5 Film by mouth daily.     No current facility-administered medications for this visit.     Allergies as of 08/01/2016  . (No Known Allergies)    Past Medical History:  Diagnosis Date  . Cancer ([place])    skin  . EtOH dependence ([place])   . Shoulder impingement 2004    Past Surgical History:  Procedure Laterality Date  .  FRACTURE SURGERY Left    wrist  . SHOULDER ACROMIOPLASTY Right 11/20/2012   Procedure: SHOULDER ACROMIOPLASTY;  Surgeon: Sanjuana Kava, MD;  Location: [facility];  Service: Orthopedics;  Laterality: Right;  . SKIN CANCER EXCISION Left 2013   wrist surgery fell off a roof  . STOMACH SURGERY     had bleeding ulcers repaired. age 64  . TOTAL KNEE ARTHROPLASTY Right 11/04/2015   Procedure: RIGHT TOTAL KNEE ARTHROPLASTY;  Surgeon: Carole Civil, MD;  Location: [facility];  Service: Orthopedics;  Laterality: Right;    Family History  Problem Relation Age of Onset  . Liver disease Neg Hx   . Colon cancer Neg Hx     Social History   Social History  . Marital status: Single    Spouse name: N/A  . Number of children: N/A  . Years of education: N/A   Occupational History  . Not on file.   Social History Main Topics  . Smoking status: Current Every Day Smoker    Packs/day: 1.00    Years: 30.00    Types: Cigars  . Smokeless tobacco: Never Used  . Alcohol use Yes     Comment: remote daily use prior to 2008, occasional use 2017.   . Drug use: No     Comment: says no  drugs 02/04/2014  . Sexual activity: Yes   Other Topics Concern  . Not on file   Social History Narrative  . No narrative on file  ROS:  General: Negative for anorexia, weight loss, fever, chills, fatigue, weakness. Eyes: Negative for vision changes.  ENT: Negative for hoarseness, difficulty swallowing , nasal congestion. CV: Negative for chest pain, angina, palpitations, dyspnea on exertion, peripheral edema.  Respiratory: Negative for dyspnea at rest, dyspnea on exertion, cough, sputum, wheezing.  GI: See history of present illness. GU:  Negative for dysuria, hematuria, urinary incontinence, urinary frequency, nocturnal urination.  MS: Negative for joint pain, low back pain.  Derm: Negative for rash or itching.  Neuro: Negative for weakness, abnormal sensation, seizure, frequent headaches, memory loss,  confusion.  Psych: Negative for anxiety, depression, suicidal ideation, hallucinations.  Endo: Negative for unusual weight change.  Heme: Negative for bruising or bleeding. Allergy: Negative for rash or hives.    Physical Examination:  BP 138/84   Pulse 67   Temp 98 F (36.7 C) (Oral)   Ht 5\' 8"  (1.727 m)   Wt 160 lb 3.2 oz (72.7 kg)   BMI 24.36 kg/m    General: Well-nourished, well-developed in no acute distress.  Head: Normocephalic, atraumatic.   Eyes: Conjunctiva pink, no icterus. Mouth: Oropharyngeal mucosa moist and pink , no lesions erythema or exudate. Neck: Supple without thyromegaly, masses, or lymphadenopathy.  Lungs: Clear to auscultation bilaterally.  Heart: Regular rate and rhythm, no murmurs rubs or gallops.  Abdomen: Bowel sounds are normal, nontender, nondistended, no hepatosplenomegaly or masses, no abdominal bruits or    hernia , no rebound or guarding.   Rectal: not performed Extremities: No lower extremity edema. No clubbing or deformities.  Neuro: Alert and oriented x 4 , grossly normal neurologically.  Skin: Warm and dry, no rash or jaundice.   Psych: Alert and cooperative, normal mood and affect.  Labs: Labs from 07/01/2016 Total bilirubin 0.4, alkaline phosphatase 81, AST 38 ALT 53, albumin 4.1, white blood cell count 7100, hemoglobin 16.5, hematocrit 47.6, platelets 208,000, hepatitis C antibody reactive, HCV RNA quantitative 393,360, HIV nonreactive  Imaging Studies: No results found.

## 2016-08-01 NOTE — Patient Instructions (Signed)
1. Please have your labs and ultrasound done. We will contact you with further information regarding treatment after results are available. 2. Avoid alcohol.   Hepatitis C Hepatitis C is a viral infection of the liver. It can lead to scarring of the liver (cirrhosis), liver failure, or liver cancer. Hepatitis C may go undetected for months or years because people with the infection may not have symptoms, or they may have only mild symptoms. What are the causes? Hepatitis C is caused by the hepatitis C virus (HCV). The virus can be passed from one person to another through:  Blood.  Contaminated needles, such as those used for tattooing, body piercing, acupuncture, or injecting drugs.  Having unprotected sex with an infected person.  Childbirth.  Blood transfusions or organ transplants done in the [place] before 1992. What increases the risk? Risk factors for hepatitis C include:  Having unprotected sex with an infected person.  Using illegal drugs. What are the signs or symptoms? Symptoms of hepatitis C may include:  Fatigue.  Loss of appetite.  Nausea.  Vomiting.  Abdominal pain.  Dark yellow urine.  Yellowish skin and eyes (jaundice).  Itching of the skin.  Clay-colored bowel movements.  Joint pain. Symptoms are not always present. How is this diagnosed? Hepatitis C is diagnosed with blood tests. Other types of tests may also be done to check how your liver is functioning. How is this treated? Your health care provider may perform noninvasive tests or a liver biopsy to help determine the best course of treatment. Treatment for hepatitis C may include one or more medicines. Your health care provider may check you for a recurring infection or other liver conditions every 6-12 months after treatment. Follow these instructions at home:  Rest as needed.  Take all medicines as directed by your health care provider.  Do not take any medicine unless approved by  your health care provider. This includes over-the-counter medicine and birth control pills.  Do not drink alcohol.  Do not have sex until approved by your health care provider.  Do not share toothbrushes, nail clippers, razors, or needles. How is this prevented? There is no vaccine for hepatitis C. The only way to prevent the disease is to reduce the risk of exposure to the virus. This may be done by:  Practicing safe sex and using condoms.  Avoiding illegal drugs. Contact a health care provider if:  You have a fever.  You develop abdominal pain.  You develop dark urine.  You have clay-colored bowel movements.  You develop joint pains. Get help right away if:  You have increasing fatigue or weakness.  You lose your appetite.  You feel nauseous or vomit.  You develop jaundice or your jaundice gets worse.  You bruise or bleed easily. This information is not intended to replace advice given to you by your health care provider. Make sure you discuss any questions you have with your health care provider. Document Released: 05/20/2000 Document Revised: 10/29/2015 Document Reviewed: 09/04/2013 Elsevier Interactive Patient Education  2017 [employer].

## 2016-08-04 NOTE — Progress Notes (Signed)
cc'ed to pcp °

## 2016-08-09 ENCOUNTER — Ambulatory Visit (HOSPITAL_COMMUNITY): Payer: Medicare Other

## 2016-08-12 ENCOUNTER — Ambulatory Visit (HOSPITAL_COMMUNITY)
Admission: RE | Admit: 2016-08-12 | Discharge: 2016-08-12 | Disposition: A | Discharge status: Home / Self Care | Payer: Medicare Other | Source: Ambulatory Visit | Attending: Gastroenterology | Admitting: Gastroenterology

## 2016-08-12 DIAGNOSIS — K8020 Calculus of gallbladder without cholecystitis without obstruction: Secondary | ICD-10-CM | POA: Diagnosis not present

## 2016-08-12 DIAGNOSIS — B182 Chronic viral hepatitis C: Secondary | ICD-10-CM

## 2016-08-12 DIAGNOSIS — I714 Abdominal aortic aneurysm, without rupture: Secondary | ICD-10-CM | POA: Insufficient documentation

## 2016-08-12 LAB — PROTIME-INR
INR: 1
Prothrombin Time: 10.9 s (ref 9.0–11.5)

## 2016-08-12 LAB — COMPREHENSIVE METABOLIC PANEL
ALT: 70 U/L — ABNORMAL HIGH (ref 9–46)
AST: 39 U/L — ABNORMAL HIGH (ref 10–35)
Albumin: 4.1 g/dL (ref 3.6–5.1)
Alkaline Phosphatase: 61 U/L (ref 40–115)
BUN: 18 mg/dL (ref 7–25)
CO2: 29 mmol/L (ref 20–31)
Calcium: 9.2 mg/dL (ref 8.6–10.3)
Chloride: 99 mmol/L (ref 98–110)
Creat: 0.83 mg/dL (ref 0.70–1.25)
Glucose, Bld: 121 mg/dL — ABNORMAL HIGH (ref 65–99)
Potassium: 4.4 mmol/L (ref 3.5–5.3)
Sodium: 134 mmol/L — ABNORMAL LOW (ref 135–146)
Total Bilirubin: 0.6 mg/dL (ref 0.2–1.2)
Total Protein: 6.9 g/dL (ref 6.1–8.1)

## 2016-08-12 LAB — HEPATITIS B CORE ANTIBODY, TOTAL: Hep B Core Total Ab: NONREACTIVE

## 2016-08-12 LAB — HEPATITIS B SURFACE ANTIGEN: Hepatitis B Surface Ag: NEGATIVE

## 2016-08-12 LAB — HEPATITIS A ANTIBODY, TOTAL: Hep A Total Ab: REACTIVE — AB

## 2016-08-12 LAB — HEPATITIS B SURFACE ANTIBODY,QUALITATIVE: Hep B S Ab: NEGATIVE

## 2016-08-18 LAB — HCV RNA, QN PCR RFLX GENO, LIPA
HCV RNA, PCR, QN: 2290000 IU/mL — ABNORMAL HIGH
HCV RNA, PCR, QN: 6.36 log IU/mL — ABNORMAL HIGH

## 2016-08-18 LAB — HEPATITIS C GENOTYPE

## 2016-08-31 ENCOUNTER — Other Ambulatory Visit: Payer: Self-pay

## 2016-08-31 DIAGNOSIS — K7460 Unspecified cirrhosis of liver: Secondary | ICD-10-CM

## 2016-08-31 NOTE — Progress Notes (Signed)
PT is aware of info and recommendations. He is aware I am faxing the info to follow up for the infrarenal AAA to his PCP to follow up with them on.  OK to schedule the MRCP. Forwarding to Clinical Staff to schedule.

## 2016-08-31 NOTE — Progress Notes (Signed)
PT is aware of results and he needs the Hep B vaccination. He is aware that I am faxing his last 3 glucose levels to PCP.

## 2016-08-31 NOTE — Progress Notes (Signed)
Please let patient know that he has an infrarenal AAA measures 3cm. He needs to have follow up u/s in 3 years and I would like for him to have this done with PCP. Please notify PCP of AAA finding and that we want them to follow up on it. Thanks!  He also has gallstones, CBD 59mm, He needs MRCP to further evaluate. Please arrange.   See lab result note.

## 2016-08-31 NOTE — Progress Notes (Signed)
Tried to call and Vm not set up.

## 2016-08-31 NOTE — Progress Notes (Signed)
No evidence of cirrhosis but patient really needs to avoid ALL alcohol because he does have FIBROSIS.

## 2016-08-31 NOTE — Progress Notes (Signed)
Tried to call pt, Vm not set up.  

## 2016-08-31 NOTE — Progress Notes (Signed)
Harvoni paper completed.

## 2016-08-31 NOTE — Progress Notes (Signed)
Patient has HCV, genotype 1b, viral count of nearly 3 million. No evidence of cirrhosis.  Hep needs Hep B vaccination.  He has mildly elevated glucose on several occasions, ?early diabetes. Send copy of last three glucose levels to PCP and ask they follow up on this. Thanks!  Please start approval process for Harvoni for 8 weeks.

## 2016-09-05 ENCOUNTER — Telehealth: Payer: Self-pay

## 2016-09-05 ENCOUNTER — Ambulatory Visit (HOSPITAL_COMMUNITY): Admission: RE | Admit: 2016-09-05 | Payer: Medicare Other | Source: Ambulatory Visit

## 2016-09-05 ENCOUNTER — Other Ambulatory Visit: Payer: Self-pay | Admitting: Gastroenterology

## 2016-09-05 DIAGNOSIS — K7460 Unspecified cirrhosis of liver: Secondary | ICD-10-CM

## 2016-09-05 NOTE — Telephone Encounter (Signed)
Randy from [facility] called- pts harvoni will be delivered tomorrow. What instructions do we need to give the pt?

## 2016-09-06 NOTE — Telephone Encounter (Signed)
  Please update med list BEFORE we tell him to come pick up [place].   Harvoni one tablet daily at the same time each day. Do not miss a dose.   OV in 4 weeks with me. We will schedule for first set of labs then.    Please have patient do the following:  When you pick up your Harvoni medication, you will asked to sign a form with a list of your current medications that indicates you agree they are the only medications you are taking.  You will also be asked to sign that you agree to NOT take any other medications, prescription, over the counter, vitamins, herbs, supplements, etc without notifying our office for permission to do so.

## 2016-09-07 NOTE — Telephone Encounter (Signed)
Pt is going to pick up medication on Friday.

## 2016-09-07 NOTE — Telephone Encounter (Signed)
I have added the new medications to his med list in epic.

## 2016-09-07 NOTE — Telephone Encounter (Signed)
Pt is aware. Went over med list, everything that we have is the same except pt is taking senekot otc and prevacid otc.   Steven Paganini do we need to change the prevacid?

## 2016-09-07 NOTE — Telephone Encounter (Signed)
Instructions and med letter done and are at the front desk for the pt to pick up.

## 2016-09-07 NOTE — Telephone Encounter (Signed)
HE WILL NEED TO STOP PREVACID.   IF HE CAN GET BY WITHOUT ACID REFLUX MEDICATION IT IS BEST TO TRY AS HARVONI WORKS BEST WITH SOME ACID IN THE STOMACH.  IF HE REQUIRES MEDICINE FOR ACID REFLUX THEN HE SHOULD TAKE ONE OF THE FOLLOWING.   He can take omeprazole 20mg  daily at the same time with Harvoni (on an empty stomach).  OR   He can take Pepcid 40mg  twice a day (NOT WITHIN FOUR HOURS OF HARVONI).   Please make sure he has written copy of these instructions.

## 2016-09-09 ENCOUNTER — Telehealth: Payer: Self-pay

## 2016-09-09 NOTE — Telephone Encounter (Signed)
Please see addendum to 08/12/2016 note of the ABD [place] report. On 08/31/2016 I faxed reports and note to Dr. Legrand Rams asking him to follow up with pt on the infrarenal AAA measuring 3 cm. And also elevated BS readings. I called the office this morning to see if it was received. I spoke to Maudie Mercury and she was short staffed, so I informed her and told her I would refax the information and she will call me if she does not receive it. It has been faxed again.

## 2016-09-13 NOTE — Telephone Encounter (Signed)
Received fax from Dr. Legrand Rams, pt has appt there on 09/19/2016 at 10:30 Am.

## 2016-09-15 ENCOUNTER — Telehealth: Payer: Self-pay

## 2016-09-15 NOTE — Telephone Encounter (Signed)
Pt called to say that he could not have the MRI because he is claustrophobic and he would need it to be an open MRI. Please advise

## 2016-09-19 ENCOUNTER — Telehealth: Payer: Self-pay | Admitting: Gastroenterology

## 2016-09-19 ENCOUNTER — Encounter (HOSPITAL_COMMUNITY): Payer: Self-pay

## 2016-09-19 ENCOUNTER — Other Ambulatory Visit: Payer: Self-pay

## 2016-09-19 ENCOUNTER — Ambulatory Visit (HOSPITAL_COMMUNITY)
Admission: RE | Admit: 2016-09-19 | Discharge: 2016-09-19 | Disposition: A | Discharge status: Home / Self Care | Payer: Medicare Other | Source: Ambulatory Visit | Attending: Gastroenterology | Admitting: Gastroenterology

## 2016-09-19 DIAGNOSIS — K7460 Unspecified cirrhosis of liver: Secondary | ICD-10-CM

## 2016-09-19 DIAGNOSIS — K8020 Calculus of gallbladder without cholecystitis without obstruction: Secondary | ICD-10-CM

## 2016-09-19 NOTE — Telephone Encounter (Signed)
Ordered entered for MRI/MRCP. Called [facility]. They will call the pt to schedule.

## 2016-09-19 NOTE — Telephone Encounter (Signed)
Shane from MRI at [facility] called to say that the patient was there this morning and was very cloustrophic and needed to be scheduled in [place].

## 2016-09-19 NOTE — Telephone Encounter (Signed)
Please see other phone note

## 2016-09-19 NOTE — Telephone Encounter (Signed)
Is it ok to schedule open MRI/MRCP at [facility]?

## 2016-09-19 NOTE — Telephone Encounter (Signed)
Yes

## 2016-09-19 NOTE — Progress Notes (Signed)
REVIEWED-NO ADDITIONAL RECOMMENDATIONS. 

## 2016-09-19 NOTE — Telephone Encounter (Signed)
It looks like he is having it done today???

## 2016-09-19 NOTE — Telephone Encounter (Signed)
Message forwarded to [place].

## 2016-09-28 ENCOUNTER — Telehealth: Payer: Self-pay | Admitting: Gastroenterology

## 2016-09-28 NOTE — Telephone Encounter (Signed)
Routing to [place].  

## 2016-09-28 NOTE — Telephone Encounter (Signed)
Forwarding FYI to [place].

## 2016-09-28 NOTE — Telephone Encounter (Signed)
[facility] called to say that they would have Harvoni delivered here tomorrow (Thursday)

## 2016-09-29 ENCOUNTER — Telehealth: Payer: Self-pay

## 2016-09-29 DIAGNOSIS — B182 Chronic viral hepatitis C: Secondary | ICD-10-CM

## 2016-09-29 NOTE — Telephone Encounter (Signed)
Thanks. Further instructions to follow pending MRI.

## 2016-09-29 NOTE — Telephone Encounter (Signed)
When is his MRI scheduled. I would like to have those results before he starts Harvoni.

## 2016-09-29 NOTE — Telephone Encounter (Signed)
It is set up for 10/07/16

## 2016-09-29 NOTE — Telephone Encounter (Addendum)
Pt's Harvoni is here. I need some instructions for him on how to take it. Please advise

## 2016-10-07 ENCOUNTER — Ambulatory Visit
Admission: RE | Admit: 2016-10-07 | Discharge: 2016-10-07 | Disposition: A | Discharge status: Home / Self Care | Payer: Medicare Other | Source: Ambulatory Visit | Attending: Gastroenterology | Admitting: Gastroenterology

## 2016-10-07 DIAGNOSIS — K8020 Calculus of gallbladder without cholecystitis without obstruction: Secondary | ICD-10-CM

## 2016-10-12 DIAGNOSIS — B182 Chronic viral hepatitis C: Secondary | ICD-10-CM | POA: Diagnosis not present

## 2016-10-12 NOTE — Addendum Note (Signed)
Addended by: Mahala Menghini on: 10/12/2016 10:10 AM   Modules accepted: Orders

## 2016-10-12 NOTE — Telephone Encounter (Signed)
Pt canceled his MRI because it was not an open MRI. He has been out of is Harvoni since May 4th. He can not come to the office tomorrow for an appointment because he is out of town. The only time he is free is Monday or Friday morning. Please advise

## 2016-10-12 NOTE — Telephone Encounter (Signed)
Patient is jeopardizing his HCV treatment. He should not have missed any days. He needs a dose TODAY. He needs to come today to get his medications. He needs labs asap, HCV RNA quantitative, CMET, CBC orders placed.   Get him in to the office for OV ASAP.

## 2016-10-12 NOTE — Telephone Encounter (Signed)
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

## 2016-10-12 NOTE — Telephone Encounter (Signed)
I spoke with the patient and he will be here this Friday at 11 am.  He is also very aware that it's very important not to miss this appt.

## 2016-10-14 LAB — CBC WITH DIFFERENTIAL/PLATELET
Basophils Absolute: 80 cells/uL (ref 0–200)
Basophils Relative: 1 %
Eosinophils Absolute: 240 cells/uL (ref 15–500)
Eosinophils Relative: 3 %
HCT: 43.5 % (ref 38.5–50.0)
Hemoglobin: 14.9 g/dL (ref 13.2–17.1)
Lymphocytes Relative: 30 %
Lymphs Abs: 2400 cells/uL (ref 850–3900)
MCH: 31.9 pg (ref 27.0–33.0)
MCHC: 34.3 g/dL (ref 32.0–36.0)
MCV: 93.1 fL (ref 80.0–100.0)
MPV: 9.2 fL (ref 7.5–12.5)
Monocytes Absolute: 720 cells/uL (ref 200–950)
Monocytes Relative: 9 %
Neutro Abs: 4560 cells/uL (ref 1500–7800)
Neutrophils Relative %: 57 %
Platelets: 217 10*3/uL (ref 140–400)
RBC: 4.67 MIL/uL (ref 4.20–5.80)
RDW: 13.8 % (ref 11.0–15.0)
WBC: 8 10*3/uL (ref 3.8–10.8)

## 2016-10-14 LAB — COMPREHENSIVE METABOLIC PANEL
ALT: 10 U/L (ref 9–46)
AST: 14 U/L (ref 10–35)
Albumin: 4 g/dL (ref 3.6–5.1)
Alkaline Phosphatase: 51 U/L (ref 40–115)
BUN: 15 mg/dL (ref 7–25)
CO2: 26 mmol/L (ref 20–31)
Calcium: 9.1 mg/dL (ref 8.6–10.3)
Chloride: 97 mmol/L — ABNORMAL LOW (ref 98–110)
Creat: 0.93 mg/dL (ref 0.70–1.25)
Glucose, Bld: 86 mg/dL (ref 65–99)
Potassium: 5.5 mmol/L — ABNORMAL HIGH (ref 3.5–5.3)
Sodium: 132 mmol/L — ABNORMAL LOW (ref 135–146)
Total Bilirubin: 0.6 mg/dL (ref 0.2–1.2)
Total Protein: 6.7 g/dL (ref 6.1–8.1)

## 2016-10-14 NOTE — Telephone Encounter (Signed)
Pt came in for ov. He was put on for the wrong Friday. I spoke with LSL about this. Pt needs labs done today and can restart harvoni today. Need to go over instructions again and pt may have to extend therapy. I went over instructions with him again, pt is not taking anything new, he will take prilosec as directed in his instructions. His last dose of harvoni was 10/07/16. Pt given lab orders , he said he will have them done today and pt will come back next Friday for ov with LSL.  I apologized multiple times for the error in scheduling. Pt stated it was ok.  harvoni given to pt.

## 2016-10-14 NOTE — Telephone Encounter (Signed)
Patient showed up for appt today but appt accidentally placed for next Friday per Almyra Free. I advised he have labs today. Start back on Harvoni. Written instructions provided again. Almyra Free went over this with the patient. He is going to come for appointment next Friday. Once his labs return I will discuss further with [facility] liver care regarding his 1 week lapse without treatment.  Almyra Free, please document your interaction with patient today.

## 2016-10-17 LAB — HEPATITIS C RNA QUANTITATIVE
HCV Quantitative Log: <1.18 Log IU/mL
HCV Quantitative: <15 IU/mL

## 2016-10-17 NOTE — Telephone Encounter (Signed)
Noted  

## 2016-10-19 NOTE — Telephone Encounter (Signed)
Noted  

## 2016-10-19 NOTE — Telephone Encounter (Signed)
To discuss with patient at upcoming [place] on Friday. We will repeat his met-7 at that time.

## 2016-10-21 ENCOUNTER — Ambulatory Visit (INDEPENDENT_AMBULATORY_CARE_PROVIDER_SITE_OTHER): Payer: Medicare Other | Admitting: Gastroenterology

## 2016-10-21 ENCOUNTER — Encounter: Payer: Self-pay | Admitting: Gastroenterology

## 2016-10-21 VITALS — BP 130/69 | HR 79 | Temp 98.2°F | Ht 68.0 in | Wt 164.2 lb

## 2016-10-21 DIAGNOSIS — B182 Chronic viral hepatitis C: Secondary | ICD-10-CM

## 2016-10-21 DIAGNOSIS — K838 Other specified diseases of biliary tract: Secondary | ICD-10-CM | POA: Diagnosis not present

## 2016-10-21 NOTE — Progress Notes (Signed)
Primary Care Physician: Rosita Fire, MD  Primary Gastroenterologist:  Barney Drain, MD   Chief Complaint  Patient presents with  . Hepatitis C    HPI: Steven [name] is a 66 y.o. male here for follow up of Hep C, Treatment nave, genotype 1B. Plans for 8 weeks of Harvoni. HCVRNA quantitative obtained at 5 weeks of therapy, negative. Unfortunately patient was out of medication approximately 1 week prior to that. At this point in time he has completed 5 weeks of therapy. Clinically sealing well. He states his right upper quadrant pain resolved. Appetite is good. Staying hydrated. No side effects. He stop Prevacid as instructed. He states is been taking his Prilosec with Harvoni. MRI is pending for dilated common bile duct and mild intrahepatic biliary dilation. Currently scheduled at open MRI due to patient's severe claustrophobia.  Patient aware of mild hyperkalemia. Refuses update his lab work for this today.  Current Outpatient Prescriptions  Medication Sig Dispense Refill  . cloNIDine (CATAPRES) 0.1 MG tablet Take 0.1 mg by mouth at bedtime.    Marland Kitchen HARVONI 90-400 MG TABS     . hydrOXYzine (ATARAX/VISTARIL) 25 MG tablet Take 25 mg by mouth at bedtime.    Marland Kitchen lisinopril-hydrochlorothiazide (PRINZIDE,ZESTORETIC) 10-12.5 MG tablet Take 1 tablet by mouth daily.    Marland Kitchen omeprazole (PRILOSEC) 20 MG capsule Take 20 mg by mouth daily.    Marland Kitchen senna (SENOKOT) 8.6 MG tablet Take 1 tablet by mouth daily.    . SUBOXONE 8-2 MG FILM Take 1.5 Film by mouth daily.     No current facility-administered medications for this visit.     Allergies as of 10/21/2016  . (No Known Allergies)    ROS:  General: Negative for anorexia, weight loss, fever, chills, fatigue, weakness. ENT: Negative for hoarseness, difficulty swallowing , nasal congestion. CV: Negative for chest pain, angina, palpitations, dyspnea on exertion, peripheral edema.  Respiratory: Negative for dyspnea at rest, dyspnea on  exertion, cough, sputum, wheezing.  GI: See history of present illness. GU:  Negative for dysuria, hematuria, urinary incontinence, urinary frequency, nocturnal urination.  Endo: Negative for unusual weight change.    Physical Examination:   BP 130/69   Pulse 79   Temp 98.2 F (36.8 C) (Oral)   Ht 5\' 8"  (1.727 m)   Wt 164 lb 3.2 oz (74.5 kg)   BMI 24.97 kg/m   General: Well-nourished, well-developed in no acute distress.  Eyes: No icterus. Mouth: Oropharyngeal mucosa moist and pink , no lesions erythema or exudate. Lungs: Clear to auscultation bilaterally.  Heart: Regular rate and rhythm, no murmurs rubs or gallops.  Abdomen: Bowel sounds are normal, nontender, nondistended, no hepatosplenomegaly or masses, no abdominal bruits or hernia , no rebound or guarding.   Extremities: No lower extremity edema. No clubbing or deformities. Neuro: Alert and oriented x 4   Skin: Warm and dry, no jaundice.   Psych: Alert and cooperative, normal mood and affect.  Labs:  Lab Results  Component Value Date   CREATININE 0.93 10/12/2016   BUN 15 10/12/2016   NA 132 (L) 10/12/2016   K 5.5 (H) 10/12/2016   CL 97 (L) 10/12/2016   CO2 26 10/12/2016   Lab Results  Component Value Date   ALT 10 10/12/2016   AST 14 10/12/2016   ALKPHOS 51 10/12/2016   BILITOT 0.6 10/12/2016   Lab Results  Component Value Date   WBC 8.0 10/12/2016   HGB 14.9 10/12/2016   HCT 43.5  10/12/2016   MCV 93.1 10/12/2016   PLT 217 10/12/2016    Imaging Studies: No results found.

## 2016-10-21 NOTE — Patient Instructions (Addendum)
1. Continue Harvoni once daily. I will contact you if we need to give you more medication since there was a week that you were out.  2. Take omeprazole or Prilosec AT THE SAME TIME AS THE HARVONI.  3. Complete the MRI as planned.  4. Do not start any medications, prescription or over the counter, without discussing with [place].  5. You need to have blood work done again at the completion of your Harvoni. PLEASE TAKE THE ORDERS TO THE LABS WITHIN A DAY OR SO AFTER YOU TAKE YOUR LAST HARVONI.

## 2016-10-21 NOTE — Assessment & Plan Note (Signed)
Chronic hepatitis C, treatment nave, genotype 1B. Completed 4 weeks of Harvoni, off a week, then HCV RNA done and negative. Patient back on Harvoni for one week. Clinically feeling well. LFTs normal. Creatinine normal. Potassium slightly elevated, patient refuses repeat labs for that. States he feels fine. Encouraged maintaining hydration. Continue no alcohol. Discussed with patient, I will discuss with colleagues regarding his labs in therapy for [place]. Further recommendations to follow. Also plan to repeat labs in therapy. Orders provided today.  Dilated common bile duct and intrahepatic biliary dilation. MRI scheduled. Was delayed because of need for open MRI.

## 2016-10-24 NOTE — Progress Notes (Signed)
cc'ed to pcp °

## 2016-10-26 ENCOUNTER — Telehealth: Payer: Self-pay | Admitting: Gastroenterology

## 2016-10-26 NOTE — Telephone Encounter (Signed)
Please let patient know that I have discussed with Roosevelt Locks, NP at [facility] liver care regarding lapse in therapy.   NEW PLAN, different from that discussed at time of ov:  He needs to have his labs LFTs, HCV RNA quant done EXACTLY one week BEFORE he runs out of Harvoni. If he is positive he will need additional four weeks of Harvoni, if he is negative we will be good with 8 weeks.   He has to have his labs done as outlined so that we can have results back BEFORE he runs out of harvoni.   BY MY CALCULATIONS HIS LABS SHOULD BE DONE June 1ST!

## 2016-10-27 NOTE — Telephone Encounter (Signed)
Routing to Ginger. She is handling pts harvoni.

## 2016-10-27 NOTE — Telephone Encounter (Signed)
Tried to call with no answer  

## 2016-10-28 ENCOUNTER — Other Ambulatory Visit: Payer: Self-pay

## 2016-10-28 DIAGNOSIS — K7460 Unspecified cirrhosis of liver: Secondary | ICD-10-CM

## 2016-10-28 NOTE — Telephone Encounter (Signed)
Pt called back and he will go have the blood work done on June 1. I am going to fax over the orders

## 2016-10-28 NOTE — Telephone Encounter (Signed)
Tried to call with no answer  

## 2016-11-02 ENCOUNTER — Ambulatory Visit: Payer: Medicare Other | Admitting: Orthopedic Surgery

## 2016-11-04 DIAGNOSIS — B182 Chronic viral hepatitis C: Secondary | ICD-10-CM | POA: Diagnosis not present

## 2016-11-04 DIAGNOSIS — K838 Other specified diseases of biliary tract: Secondary | ICD-10-CM | POA: Diagnosis not present

## 2016-11-04 LAB — COMPREHENSIVE METABOLIC PANEL
ALT: 11 U/L (ref 9–46)
AST: 13 U/L (ref 10–35)
Albumin: 3.9 g/dL (ref 3.6–5.1)
Alkaline Phosphatase: 59 U/L (ref 40–115)
BUN: 16 mg/dL (ref 7–25)
CO2: 28 mmol/L (ref 20–31)
Calcium: 9.2 mg/dL (ref 8.6–10.3)
Chloride: 97 mmol/L — ABNORMAL LOW (ref 98–110)
Creat: 0.88 mg/dL (ref 0.70–1.25)
Glucose, Bld: 113 mg/dL — ABNORMAL HIGH (ref 65–99)
Potassium: 4.9 mmol/L (ref 3.5–5.3)
Sodium: 133 mmol/L — ABNORMAL LOW (ref 135–146)
Total Bilirubin: 0.5 mg/dL (ref 0.2–1.2)
Total Protein: 6.7 g/dL (ref 6.1–8.1)

## 2016-11-07 ENCOUNTER — Ambulatory Visit (INDEPENDENT_AMBULATORY_CARE_PROVIDER_SITE_OTHER): Payer: Medicare Other | Admitting: Orthopedic Surgery

## 2016-11-07 ENCOUNTER — Encounter: Payer: Self-pay | Admitting: Orthopedic Surgery

## 2016-11-07 ENCOUNTER — Ambulatory Visit (INDEPENDENT_AMBULATORY_CARE_PROVIDER_SITE_OTHER): Payer: Medicare Other

## 2016-11-07 DIAGNOSIS — Z96652 Presence of left artificial knee joint: Secondary | ICD-10-CM

## 2016-11-07 NOTE — Progress Notes (Signed)
ANNUAL FOLLOW UP FOR  left TKA   Chief Complaint  Patient presents with  . Follow-up    XRAY RT TKA, 11/04/15     HPI: The patient is here for the annual  follow-up x-ray for knee replacement  He really is not having any problems with his knee   Review of Systems  Constitutional: Negative for chills and fever.  Neurological: Negative for tingling.    Examination of the left KNEE   Inspection shows : incision healed nicely without erythema, no tenderness no swelling  Range of motion total range of motion is 115  Stability the knee is stable anterior to posterior as well as medial to lateral  Strength quadriceps strength is normal  Skin no erythema around the skin incision  Cardiovascular NO EDEMA    Medical decision-making section  X-rays ordered with the following personal interpretation  Normal alignment without loosening   Diagnosis  Encounter Diagnosis  Name Primary?  . Status post total left knee replacement Yes     Plan follow-up 1 year repeat x-rays

## 2016-11-08 ENCOUNTER — Telehealth: Payer: Self-pay | Admitting: Gastroenterology

## 2016-11-08 NOTE — Telephone Encounter (Signed)
Pt is wanting to know if he need to keep taking his Harvoni. He had blood work done Friday. He will be out of Medication Thursday. Please advise

## 2016-11-08 NOTE — Telephone Encounter (Signed)
Forwarding to Ginger who is handling pt's Harvoni.

## 2016-11-08 NOTE — Telephone Encounter (Signed)
Pt called this morning asking about his harvoni, labs and still having to take his medication. Call got disconnected accidentally. Please call patient at [phone]

## 2016-11-08 NOTE — Telephone Encounter (Signed)
Talked with the lab and they said that it can take a week to get results from the date drawn.

## 2016-11-08 NOTE — Telephone Encounter (Signed)
There is no way to change it to STAT. They said that we would not get there results until sometime next week. I can not order additional 4 weeks until we have the blood work to show them that he still testing positive. He was only set up for 8 weeks to start with.

## 2016-11-08 NOTE — Telephone Encounter (Signed)
The RNA is not back yet. Can we call the lab and see when this will be resulted?

## 2016-11-08 NOTE — Telephone Encounter (Signed)
I called [facility] and spoke with a supervisor(Era B.) in regards to the Hep C RNA results.  She was going to investigate this and get back to me.

## 2016-11-08 NOTE — Telephone Encounter (Signed)
The lab was drawn on 6/1. Camille: we have had this issue with [facility] before. I have some patients where it is resulted in 3 days' time. Now they are saying it may be sometime next week? Can we please have [facility] clarify exactly where it is in the process? This was drawn on the 1st, which was Friday. I understand the weekend causes some lag, but I would expect it back today or tomorrow at the latest. Next week is unacceptable per [facility].   Appears we can't do anything until the results are back (such as ordering another round). In fact, he may not need it, and this is the hope. However, right now we just don't know. I would tell him we will continue to follow his blood work and get things rolling IF it's needed. Once we have them back, we will know what to do. I have no other additional input for today other than waiting for blood work to result and getting a clear direction from [place] regarding this lab.

## 2016-11-08 NOTE — Telephone Encounter (Signed)
Is there any way to change this to stat? Also, let's try to see if we can get the ball rolling for an additional 4 weeks of therapy in case needed. It may or may not be covered, but we need to try. How quickly do they send? Overnight?

## 2016-11-08 NOTE — Telephone Encounter (Signed)
Thank you all for your help.  

## 2016-11-09 LAB — HEPATITIS C RNA QUANTITATIVE
HCV Quantitative Log: <1.18 Log IU/mL
HCV Quantitative: <15 IU/mL

## 2016-11-09 NOTE — Progress Notes (Signed)
Please let patient know his HCV RNA is NEGATIVE.  Complete Harvoni he has, I believe his last dose is 11/10/16. Does not need additional Harvoni as discussed with colleagues previously.  Needs HCVRNA quantitative in 12 weeks.  Does he have MRCP scheduled at open MRI, he was scheduling it the day of his last OV.

## 2016-11-09 NOTE — Telephone Encounter (Signed)
Routing to Boyd for [employer].

## 2016-11-09 NOTE — Telephone Encounter (Signed)
I spoke with Jerrell Belfast, [facility] hospital Liaison at [phone] and he stated that this test is now resulted in [place] and could take up to 7 days to result.  He is going to call that lab in [place] to see if we can expedite the request.

## 2016-11-09 NOTE — Telephone Encounter (Signed)
See result note.  

## 2016-11-09 NOTE — Telephone Encounter (Signed)
Steven [name], thank you for calling supervisor. Can we follow up on response? 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

## 2016-11-10 NOTE — Progress Notes (Signed)
Tried to call. Vm not set up.  

## 2016-12-08 ENCOUNTER — Other Ambulatory Visit: Payer: Self-pay

## 2016-12-08 ENCOUNTER — Other Ambulatory Visit: Payer: Self-pay | Admitting: Gastroenterology

## 2016-12-08 DIAGNOSIS — B182 Chronic viral hepatitis C: Secondary | ICD-10-CM

## 2016-12-16 NOTE — Progress Notes (Signed)
Noted the difficultly with MRI.   If patient does not plan to continue attempts to complete MRI, then I need him to come for OV with RMR only (dilated bile duct/MRI could not be done) to discuss next best step. Please schedule ov with rmr only first available (I believe patient requires Fridays).

## 2016-12-21 ENCOUNTER — Encounter: Payer: Self-pay | Admitting: Internal Medicine

## 2016-12-26 DIAGNOSIS — I10 Essential (primary) hypertension: Secondary | ICD-10-CM | POA: Diagnosis not present

## 2016-12-26 DIAGNOSIS — F17200 Nicotine dependence, unspecified, uncomplicated: Secondary | ICD-10-CM | POA: Diagnosis not present

## 2016-12-26 DIAGNOSIS — M1990 Unspecified osteoarthritis, unspecified site: Secondary | ICD-10-CM | POA: Diagnosis not present

## 2017-01-05 DIAGNOSIS — B182 Chronic viral hepatitis C: Secondary | ICD-10-CM | POA: Diagnosis not present

## 2017-01-09 LAB — HEPATITIS C RNA QUANTITATIVE
HCV Quantitative Log: <1.18 Log IU/mL
HCV Quantitative: <15 IU/mL

## 2017-01-15 NOTE — Progress Notes (Signed)
HCV RNA negative. Repeat HCV RNA one last time in six months. Keep OV with RMR as scheduled to discuss dilated bile duct.

## 2017-01-16 ENCOUNTER — Other Ambulatory Visit: Payer: Self-pay

## 2017-01-16 DIAGNOSIS — B182 Chronic viral hepatitis C: Secondary | ICD-10-CM

## 2017-01-16 NOTE — Progress Notes (Signed)
Error on my part. Patient inadvertently placed on RMR schedule several weeks ago.  Needs to see SLF only for dilated bile duct/could not complete MRI.

## 2017-01-16 NOTE — Progress Notes (Signed)
Called patient and he is coming in October to see slf

## 2017-01-16 NOTE — Progress Notes (Signed)
Pt is aware of results and plan to repeat in 6 months.  He is aware we will mail the order to him and it is on file now. Forwarding to Stacy to change the appt to SF instead of RMR.

## 2017-01-20 NOTE — Progress Notes (Signed)
REVIEWED-NO ADDITIONAL RECOMMENDATIONS. HCV RNA AUG 2918 NEGATIVE

## 2017-02-10 ENCOUNTER — Ambulatory Visit: Payer: Medicare Other | Admitting: Internal Medicine

## 2017-02-14 ENCOUNTER — Ambulatory Visit: Payer: Medicare Other | Admitting: Internal Medicine

## 2017-02-27 DIAGNOSIS — G894 Chronic pain syndrome: Secondary | ICD-10-CM | POA: Diagnosis not present

## 2017-03-01 ENCOUNTER — Ambulatory Visit: Payer: Medicare Other | Admitting: Gastroenterology

## 2017-03-09 ENCOUNTER — Ambulatory Visit: Payer: Medicare Other | Admitting: Gastroenterology

## 2017-03-31 DIAGNOSIS — I714 Abdominal aortic aneurysm, without rupture: Secondary | ICD-10-CM | POA: Diagnosis not present

## 2017-03-31 DIAGNOSIS — F17200 Nicotine dependence, unspecified, uncomplicated: Secondary | ICD-10-CM | POA: Diagnosis not present

## 2017-03-31 DIAGNOSIS — I10 Essential (primary) hypertension: Secondary | ICD-10-CM | POA: Diagnosis not present

## 2017-03-31 DIAGNOSIS — F17210 Nicotine dependence, cigarettes, uncomplicated: Secondary | ICD-10-CM | POA: Diagnosis not present

## 2017-03-31 DIAGNOSIS — B188 Other chronic viral hepatitis: Secondary | ICD-10-CM | POA: Diagnosis not present

## 2017-06-26 ENCOUNTER — Other Ambulatory Visit: Payer: Self-pay

## 2017-06-26 DIAGNOSIS — B182 Chronic viral hepatitis C: Secondary | ICD-10-CM

## 2017-07-07 ENCOUNTER — Other Ambulatory Visit (HOSPITAL_COMMUNITY)
Admission: RE | Admit: 2017-07-07 | Discharge: 2017-07-07 | Disposition: A | Discharge status: Home / Self Care | Payer: Medicare Other | Source: Ambulatory Visit | Attending: Internal Medicine | Admitting: Internal Medicine

## 2017-07-07 DIAGNOSIS — I10 Essential (primary) hypertension: Secondary | ICD-10-CM | POA: Diagnosis not present

## 2017-07-07 DIAGNOSIS — M1990 Unspecified osteoarthritis, unspecified site: Secondary | ICD-10-CM | POA: Insufficient documentation

## 2017-07-07 LAB — POTASSIUM: Potassium: 5 mmol/L (ref 3.5–5.1)

## 2017-07-20 LAB — HEPATITIS C RNA QUANTITATIVE
HCV Quantitative Log: <1.18 Log IU/mL
HCV RNA, PCR, QN: <15 IU/mL

## 2017-07-30 NOTE — Progress Notes (Signed)
Please let patient know his HCV RNA is negative. No further testing needed unless gets exposed to Hep C by risky behaviors. OR if his liver numbers are elevated.   HE NEEDS TO RESCHEDULE HIS MISSED VISIT WITH SLF ONLY TO DISCUSS "DILATED BILE DUCT AND UNABLE TO GET MRI."

## 2017-07-31 DIAGNOSIS — G894 Chronic pain syndrome: Secondary | ICD-10-CM | POA: Diagnosis not present

## 2017-07-31 DIAGNOSIS — Z79891 Long term (current) use of opiate analgesic: Secondary | ICD-10-CM | POA: Diagnosis not present

## 2017-07-31 NOTE — Progress Notes (Signed)
Called pt. Unable to leave message. Mailing a letter for pt to call.

## 2017-08-01 DIAGNOSIS — I10 Essential (primary) hypertension: Secondary | ICD-10-CM | POA: Diagnosis not present

## 2017-08-01 DIAGNOSIS — S01511A Laceration without foreign body of lip, initial encounter: Secondary | ICD-10-CM | POA: Diagnosis not present

## 2017-08-01 DIAGNOSIS — Z79891 Long term (current) use of opiate analgesic: Secondary | ICD-10-CM | POA: Diagnosis not present

## 2017-08-01 DIAGNOSIS — Z888 Allergy status to other drugs, medicaments and biological substances status: Secondary | ICD-10-CM | POA: Diagnosis not present

## 2017-08-01 DIAGNOSIS — Z85828 Personal history of other malignant neoplasm of skin: Secondary | ICD-10-CM | POA: Diagnosis not present

## 2017-08-01 DIAGNOSIS — Z79899 Other long term (current) drug therapy: Secondary | ICD-10-CM | POA: Diagnosis not present

## 2017-08-01 DIAGNOSIS — Z96651 Presence of right artificial knee joint: Secondary | ICD-10-CM | POA: Diagnosis not present

## 2017-08-01 DIAGNOSIS — Z882 Allergy status to sulfonamides status: Secondary | ICD-10-CM | POA: Diagnosis not present

## 2017-08-08 ENCOUNTER — Telehealth: Payer: Self-pay

## 2017-08-08 ENCOUNTER — Encounter: Payer: Self-pay | Admitting: Gastroenterology

## 2017-08-08 NOTE — Telephone Encounter (Signed)
Forwarding to Stacey to schedule appt.

## 2017-08-08 NOTE — Telephone Encounter (Signed)
PATIENT SCHEDULED AND LETTER SENT  °

## 2017-08-08 NOTE — Telephone Encounter (Signed)
Pt received the letter to call. He is aware of his results ( see labs on 07/17/2017 Hep C RNA). I asked him to schedule appt with Dr. Oneida Alar, but he can only come on a Monday or a Friday. Does he need to schedule with you Magda Paganini?  Please advise!  Pt can be reached at [phone].

## 2017-08-08 NOTE — Telephone Encounter (Signed)
REVIEWED. Per PT REQUEST HE NEEDS A MON/FRI appt.

## 2017-08-28 DIAGNOSIS — Z79891 Long term (current) use of opiate analgesic: Secondary | ICD-10-CM | POA: Diagnosis not present

## 2017-08-28 DIAGNOSIS — G894 Chronic pain syndrome: Secondary | ICD-10-CM | POA: Diagnosis not present

## 2017-09-05 ENCOUNTER — Encounter: Payer: Self-pay | Admitting: Gastroenterology

## 2017-09-05 DIAGNOSIS — Z1211 Encounter for screening for malignant neoplasm of colon: Secondary | ICD-10-CM | POA: Diagnosis not present

## 2017-09-05 DIAGNOSIS — Z1212 Encounter for screening for malignant neoplasm of rectum: Secondary | ICD-10-CM | POA: Diagnosis not present

## 2017-09-05 LAB — COLOGUARD

## 2017-09-25 DIAGNOSIS — G894 Chronic pain syndrome: Secondary | ICD-10-CM | POA: Diagnosis not present

## 2017-09-25 DIAGNOSIS — Z79891 Long term (current) use of opiate analgesic: Secondary | ICD-10-CM | POA: Diagnosis not present

## 2017-09-29 DIAGNOSIS — I10 Essential (primary) hypertension: Secondary | ICD-10-CM | POA: Diagnosis not present

## 2017-09-29 DIAGNOSIS — G8929 Other chronic pain: Secondary | ICD-10-CM | POA: Diagnosis not present

## 2017-10-06 ENCOUNTER — Ambulatory Visit (INDEPENDENT_AMBULATORY_CARE_PROVIDER_SITE_OTHER): Payer: Medicare Other | Admitting: Gastroenterology

## 2017-10-06 ENCOUNTER — Encounter: Payer: Self-pay | Admitting: Gastroenterology

## 2017-10-06 VITALS — BP 133/76 | HR 71 | Temp 97.0°F | Ht 70.0 in | Wt 184.8 lb

## 2017-10-06 DIAGNOSIS — K838 Other specified diseases of biliary tract: Secondary | ICD-10-CM | POA: Diagnosis not present

## 2017-10-06 DIAGNOSIS — R195 Other fecal abnormalities: Secondary | ICD-10-CM | POA: Insufficient documentation

## 2017-10-06 DIAGNOSIS — K7400 Hepatic fibrosis, unspecified: Secondary | ICD-10-CM

## 2017-10-06 NOTE — Progress Notes (Addendum)
Primary Care Physician:  Rosita Fire, MD  Primary Gastroenterologist:  Barney Drain, MD REVIEWED. TCS AUG 5 @ 0730  Chief Complaint  Patient presents with  . Hepatitis C    f/u  . positive cologuard    HPI:  Steven [name] is a 67 y.o. male here at the request of Dr. Legrand Rams for positive Cologuard test.  Patient well-known to our practice, history of hepatitis C status post eradication with documented SVR, treated with Harvoni.  He also has a history of dilated common bile duct, we attempted to do an MRCP but patient states they were never able to gain IV access so he refused further attempts.  His LFTs were normal.  Patient returns today stating that he feels great.  He denies any bowel concerns.  No blood in stool or melena.  No abdominal pain.  He continues to work.  No dysphagia, occasional heartburn.  No vomiting.  Appetite is good.  No unintentional weight loss.  He is actually gained 20 pounds in the last year.  Current Outpatient Medications  Medication Sig Dispense Refill  . cloNIDine (CATAPRES) 0.1 MG tablet Take 0.1 mg by mouth at bedtime.    . hydrOXYzine (ATARAX/VISTARIL) 25 MG tablet Take 25 mg by mouth at bedtime.    Marland Kitchen lisinopril-hydrochlorothiazide (PRINZIDE,ZESTORETIC) 10-12.5 MG tablet Take 1 tablet by mouth daily.    Marland Kitchen omeprazole (PRILOSEC) 20 MG capsule Take 20 mg by mouth daily.    Marland Kitchen senna (SENOKOT) 8.6 MG tablet Take 1 tablet by mouth daily.    . SUBOXONE 8-2 MG FILM Take 1.5 Film by mouth daily.     No current facility-administered medications for this visit.     Allergies as of 10/06/2017  . (No Known Allergies)    Past Medical History:  Diagnosis Date  . Cancer ([place])    skin  . EtOH dependence ([place])   . Hepatitis C    successfully eradicated with Harvoni in 2018 with SVR documented  . Shoulder impingement 2004    Past Surgical History:  Procedure Laterality Date  . FRACTURE SURGERY Left    wrist  . SHOULDER ACROMIOPLASTY Right 11/20/2012   Procedure: SHOULDER ACROMIOPLASTY;  Surgeon: Sanjuana Kava, MD;  Location: [facility];  Service: Orthopedics;  Laterality: Right;  . SKIN CANCER EXCISION Left 2013   wrist surgery fell off a roof  . STOMACH SURGERY     had bleeding ulcers repaired. age 50  . TOTAL KNEE ARTHROPLASTY Right 11/04/2015   Procedure: RIGHT TOTAL KNEE ARTHROPLASTY;  Surgeon: Carole Civil, MD;  Location: [facility];  Service: Orthopedics;  Laterality: Right;    Family History  Problem Relation Age of Onset  . Liver disease Neg Hx   . Colon cancer Neg Hx     Social History   Socioeconomic History  . Marital status: Divorced    Spouse name: Not on file  . Number of children: Not on file  . Years of education: Not on file  . Highest education level: Not on file  Occupational History  . Not on file  Social Needs  . Financial resource strain: Not on file  . Food insecurity:    Worry: Not on file    Inability: Not on file  . Transportation needs:    Medical: Not on file    Non-medical: Not on file  Tobacco Use  . Smoking status: Current Every Day Smoker    Packs/day: 1.00    Years: 30.00    Pack  years: 30.00    Types: Cigars  . Smokeless tobacco: Never Used  Substance and Sexual Activity  . Alcohol use: Yes    Comment: remote daily use prior to 2008, occasional use 2017.   . Drug use: No    Frequency: 7.0 times per week    Types: Marijuana    Comment: says no  drugs 02/04/2014  . Sexual activity: Yes  Lifestyle  . Physical activity:    Days per week: Not on file    Minutes per session: Not on file  . Stress: Not on file  Relationships  . Social connections:    Talks on phone: Not on file    Gets together: Not on file    Attends religious service: Not on file    Active member of club or organization: Not on file    Attends meetings of clubs or organizations: Not on file    Relationship status: Not on file  . Intimate partner violence:    Fear of current or ex partner: Not on file     Emotionally abused: Not on file    Physically abused: Not on file    Forced sexual activity: Not on file  Other Topics Concern  . Not on file  Social History Narrative  . Not on file      ROS:  General: Negative for anorexia, weight loss, fever, chills, fatigue, weakness. Eyes: Negative for vision changes.  ENT: Negative for hoarseness, difficulty swallowing , nasal congestion. CV: Negative for chest pain, angina, palpitations, dyspnea on exertion, peripheral edema.  Respiratory: Negative for dyspnea at rest, dyspnea on exertion, cough, sputum, wheezing.  GI: See history of present illness. GU:  Negative for dysuria, hematuria, urinary incontinence, urinary frequency, nocturnal urination.  MS: Negative for joint pain, low back pain.  Derm: Negative for rash or itching.  Neuro: Negative for weakness, abnormal sensation, seizure, frequent headaches, memory loss, confusion.  Psych: Negative for anxiety, depression, suicidal ideation, hallucinations.  Endo: Negative for unusual weight change.  Heme: Negative for bruising or bleeding. Allergy: Negative for rash or hives.    Physical Examination:  BP 133/76   Pulse 71   Temp (!) 97 F (36.1 C) (Oral)   Ht 5\' 10"  (1.778 m)   Wt 184 lb 12.8 oz (83.8 kg)   BMI 26.52 kg/m    General: Well-nourished, well-developed in no acute distress.  Head: Normocephalic, atraumatic.   Eyes: Conjunctiva pink, no icterus. Mouth: Oropharyngeal mucosa moist and pink , Neck: Supple  Lungs: Clear to auscultation bilaterally.  Heart: Regular rate and rhythm, no murmurs rubs or gallops.  Abdomen: Bowel sounds are normal, nontender, nondistended, no hepatosplenomegaly or masses, no abdominal bruits or    hernia , no rebound or guarding.   Rectal: Not performed Extremities: No lower extremity edema. No clubbing or deformities.  Neuro: Alert and oriented x 4 , grossly normal neurologically.  Skin: Warm and dry, no rash or jaundice.   Psych: Alert and  cooperative, normal mood and affect.  Labs: Lab Results  Component Value Date   ALT 11 11/04/2016   AST 13 11/04/2016   ALKPHOS 59 11/04/2016   BILITOT 0.5 11/04/2016   Lab Results  Component Value Date   CREATININE 0.88 11/04/2016   BUN 16 11/04/2016   NA 133 (L) 11/04/2016   K 5.0 07/07/2017   CL 97 (L) 11/04/2016   CO2 28 11/04/2016   Lab Results  Component Value Date   WBC 8.0 10/12/2016  HGB 14.9 10/12/2016   HCT 43.5 10/12/2016   MCV 93.1 10/12/2016   PLT 217 10/12/2016     Imaging Studies: No results found.

## 2017-10-06 NOTE — Assessment & Plan Note (Signed)
Status post HCV eradication.  History of F2 with some F3 on elastography.  We have dilated common bile duct.  Due for follow-up of this as he was never able to undergo MRCP.  Patient agreeable to repeat ultrasound at this time which I think is reasonable.  If common bile duct remains dilated he will need further imaging.

## 2017-10-06 NOTE — Patient Instructions (Signed)
1. Please have your ultrasound done as scheduled.  2. We will plan on colonoscopy in 01/2018 per your request.  3. Call with any questions or concerns.

## 2017-10-06 NOTE — Assessment & Plan Note (Signed)
Patient had a positive Cologuard test back in April 2019.  He needs colonoscopy.  Right now he wants to postpone till August due to his work schedule.  Works out of town the majority of the week, busiest time during the summer.  He will require deep sedation given Suboxone use, Atarax, prior history of alcohol abuse and polypharmacy.  At this time patient states he can only do on a Monday and Friday due to his work schedule.  We will discuss with Dr. Oneida Alar to see if there are any other options to get this patient done on a Monday or Friday in August per his request.

## 2017-10-09 NOTE — Progress Notes (Signed)
CC'ED TO PCP 

## 2017-10-13 ENCOUNTER — Ambulatory Visit (HOSPITAL_COMMUNITY): Payer: Medicare Other

## 2017-10-17 ENCOUNTER — Other Ambulatory Visit: Payer: Self-pay

## 2017-10-17 ENCOUNTER — Telehealth: Payer: Self-pay

## 2017-10-17 DIAGNOSIS — R195 Other fecal abnormalities: Secondary | ICD-10-CM

## 2017-10-17 MED ORDER — CLENPIQ 10-3.5-12 MG-GM -GM/160ML PO SOLN: 1.0000 | Freq: Once | ORAL | 0 refills | Status: AC

## 2017-10-17 NOTE — Telephone Encounter (Signed)
Pt needs TCS w/Propofol w/SLF on a Mon or Fri in August d/t his work schedule. SLF advised she can do 01/08/18 at 7:30am.  Called pt, TCS scheduled for 01/08/18 at 7:30am. Rx for prep sent to pharmacy. Instructions to be mailed after pre-op appt made. Orders entered

## 2017-10-17 NOTE — Telephone Encounter (Signed)
Called and informed pt of pre-op appt 01/01/18 at 8:00am. Letter mailed with procedure instructions.

## 2017-10-19 ENCOUNTER — Ambulatory Visit (HOSPITAL_COMMUNITY)
Admission: RE | Admit: 2017-10-19 | Discharge: 2017-10-19 | Disposition: A | Discharge status: Home / Self Care | Payer: Medicare Other | Source: Ambulatory Visit | Attending: Gastroenterology | Admitting: Gastroenterology

## 2017-10-19 DIAGNOSIS — K7400 Hepatic fibrosis, unspecified: Secondary | ICD-10-CM

## 2017-10-19 DIAGNOSIS — K8020 Calculus of gallbladder without cholecystitis without obstruction: Secondary | ICD-10-CM | POA: Insufficient documentation

## 2017-10-19 DIAGNOSIS — K838 Other specified diseases of biliary tract: Secondary | ICD-10-CM | POA: Diagnosis not present

## 2017-10-23 DIAGNOSIS — Z79891 Long term (current) use of opiate analgesic: Secondary | ICD-10-CM | POA: Diagnosis not present

## 2017-11-01 NOTE — Progress Notes (Signed)
Mailed a letter to call.

## 2017-11-01 NOTE — Progress Notes (Signed)
Tried to call and not available, try again later.

## 2017-11-06 ENCOUNTER — Ambulatory Visit: Payer: Self-pay | Admitting: Orthopedic Surgery

## 2017-11-08 ENCOUNTER — Encounter: Payer: Self-pay | Admitting: *Deleted

## 2017-11-08 ENCOUNTER — Other Ambulatory Visit: Payer: Self-pay | Admitting: *Deleted

## 2017-11-08 DIAGNOSIS — K838 Other specified diseases of biliary tract: Secondary | ICD-10-CM

## 2017-11-08 NOTE — Progress Notes (Signed)
Pt is aware and is willing to attempt another MRCP. He is aware to follow up with his PCP for his stable AAA.  Forwarding to [facility] to schedule the MRCP. Forwarding to [place] to nic the RUQ [place].

## 2017-11-08 NOTE — Progress Notes (Signed)
Steven [name], is the nic suppose to be 6 months for the RUQ [place]?

## 2017-11-08 NOTE — Progress Notes (Signed)
Called home number and could not leave a message. Called work number and someone is going to have pt call me.

## 2017-11-09 NOTE — Progress Notes (Signed)
Forwarding to St[place] to nic.

## 2017-11-09 NOTE — Progress Notes (Signed)
ON RECALL  °

## 2017-11-15 ENCOUNTER — Ambulatory Visit (HOSPITAL_COMMUNITY): Payer: Medicare Other

## 2017-11-20 DIAGNOSIS — G894 Chronic pain syndrome: Secondary | ICD-10-CM | POA: Diagnosis not present

## 2017-11-20 DIAGNOSIS — M545 Low back pain: Secondary | ICD-10-CM | POA: Diagnosis not present

## 2017-11-20 DIAGNOSIS — Z79891 Long term (current) use of opiate analgesic: Secondary | ICD-10-CM | POA: Diagnosis not present

## 2017-11-24 ENCOUNTER — Ambulatory Visit: Payer: Self-pay | Admitting: Orthopedic Surgery

## 2017-12-04 DIAGNOSIS — M545 Low back pain: Secondary | ICD-10-CM | POA: Diagnosis not present

## 2017-12-04 DIAGNOSIS — G894 Chronic pain syndrome: Secondary | ICD-10-CM | POA: Diagnosis not present

## 2017-12-04 DIAGNOSIS — Z79891 Long term (current) use of opiate analgesic: Secondary | ICD-10-CM | POA: Diagnosis not present

## 2017-12-15 ENCOUNTER — Encounter: Payer: Self-pay | Admitting: Orthopedic Surgery

## 2017-12-15 ENCOUNTER — Ambulatory Visit: Payer: Self-pay | Admitting: Orthopedic Surgery

## 2017-12-18 DIAGNOSIS — G894 Chronic pain syndrome: Secondary | ICD-10-CM | POA: Diagnosis not present

## 2017-12-18 DIAGNOSIS — M545 Low back pain: Secondary | ICD-10-CM | POA: Diagnosis not present

## 2017-12-18 DIAGNOSIS — Z79891 Long term (current) use of opiate analgesic: Secondary | ICD-10-CM | POA: Diagnosis not present

## 2017-12-28 NOTE — Patient Instructions (Signed)
Steven [name]  12/28/2017     @PREFPERIOPPHARMACY @   Your procedure is scheduled on 01/08/2018.  Report to [facility] at 6:15 A.M.  Call this number if you have problems the morning of surgery:  [phone]   Remember:  Do not eat or drink after midnight.  (Follow instructions given to you by Dr Nona Dell office and contact them with any questions regarding prep)      Take these medicines the morning of surgery with A SIP OF WATER Catapress, Suboxone, Prilosec    Do not wear jewelry, make-up or nail polish.  Do not wear lotions, powders, or perfumes, or deodorant.  Do not shave 48 hours prior to surgery.  Men may shave face and neck.  Do not bring valuables to the hospital.  [facility] is not responsible for any belongings or valuables.  Contacts, dentures or bridgework may not be worn into surgery.  Leave your suitcase in the car.  After surgery it may be brought to your room.  For patients admitted to the hospital, discharge time will be determined by your treatment team.  Patients discharged the day of surgery will not be allowed to drive or make legal decisions for 24 hours post procedure.    Please read over the following fact sheets that you were given. Anesthesia Post-op Instructions     PATIENT INSTRUCTIONS POST-ANESTHESIA  IMMEDIATELY FOLLOWING SURGERY:  Do not drive or operate machinery for the first twenty four hours after surgery.  Do not make any important decisions for twenty four hours after surgery or while taking narcotic pain medications or sedatives.  If you develop intractable nausea and vomiting or a severe headache please notify your doctor immediately.  FOLLOW-UP:  Please make an appointment with your surgeon as instructed. You do not need to follow up with anesthesia unless specifically instructed to do so.  WOUND CARE INSTRUCTIONS (if applicable):  Keep a dry clean dressing on the anesthesia/puncture wound site if there is drainage.  Once the  wound has quit draining you may leave it open to air.  Generally you should leave the bandage intact for twenty four hours unless there is drainage.  If the epidural site drains for more than 36-48 hours please call the anesthesia department.  QUESTIONS?:  Please feel free to call your physician or the hospital operator if you have any questions, and they will be happy to assist you.      Colonoscopy, Adult A colonoscopy is an exam to look at the entire large intestine. During the exam, a lubricated, bendable tube is inserted into the anus and then passed into the rectum, colon, and other parts of the large intestine. A colonoscopy is often done as a part of normal colorectal screening or in response to certain symptoms, such as anemia, persistent diarrhea, abdominal pain, and blood in the stool. The exam can help screen for and diagnose medical problems, including:  Tumors.  Polyps.  Inflammation.  Areas of bleeding.  Tell a health care provider about:  Any allergies you have.  All medicines you are taking, including vitamins, herbs, eye drops, creams, and over-the-counter medicines.  Any problems you or family members have had with anesthetic medicines.  Any blood disorders you have.  Any surgeries you have had.  Any medical conditions you have.  Any problems you have had passing stool. What are the risks? Generally, this is a safe procedure. However, problems may occur, including:  Bleeding.  A tear in the intestine.  A reaction to medicines given during the exam.  Infection (rare).  What happens before the procedure? Eating and drinking restrictions Follow instructions from your health care provider about eating and drinking, which may include:  A few days before the procedure - follow a low-fiber diet. Avoid nuts, seeds, dried fruit, raw fruits, and vegetables.  1-3 days before the procedure - follow a clear liquid diet. Drink only clear liquids, such as clear  broth or bouillon, black coffee or tea, clear juice, clear soft drinks or sports drinks, gelatin dessert, and popsicles. Avoid any liquids that contain red or purple dye.  On the day of the procedure - do not eat or drink anything during the 2 hours before the procedure, or within the time period that your health care provider recommends.  Bowel prep If you were prescribed an oral bowel prep to clean out your colon:  Take it as told by your health care provider. Starting the day before your procedure, you will need to drink a large amount of medicated liquid. The liquid will cause you to have multiple loose stools until your stool is almost clear or light green.  If your skin or anus gets irritated from diarrhea, you may use these to relieve the irritation: ? Medicated wipes, such as adult wet wipes with aloe and vitamin E. ? A skin soothing-product like petroleum jelly.  If you vomit while drinking the bowel prep, take a break for up to 60 minutes and then begin the bowel prep again. If vomiting continues and you cannot take the bowel prep without vomiting, call your health care provider.  General instructions  Ask your health care provider about changing or stopping your regular medicines. This is especially important if you are taking diabetes medicines or blood thinners.  Plan to have someone take you home from the hospital or clinic. What happens during the procedure?  An IV tube may be inserted into one of your veins.  You will be given medicine to help you relax (sedative).  To reduce your risk of infection: ? Your health care team will wash or sanitize their hands. ? Your anal area will be washed with soap.  You will be asked to lie on your side with your knees bent.  Your health care provider will lubricate a long, thin, flexible tube. The tube will have a camera and a light on the end.  The tube will be inserted into your anus.  The tube will be gently eased through your  rectum and colon.  Air will be delivered into your colon to keep it open. You may feel some pressure or cramping.  The camera will be used to take images during the procedure.  A small tissue sample may be removed from your body to be examined under a microscope (biopsy). If any potential problems are found, the tissue will be sent to a lab for testing.  If small polyps are found, your health care provider may remove them and have them checked for cancer cells.  The tube that was inserted into your anus will be slowly removed. The procedure may vary among health care providers and hospitals. What happens after the procedure?  Your blood pressure, heart rate, breathing rate, and blood oxygen level will be monitored until the medicines you were given have worn off.  Do not drive for 24 hours after the exam.  You may have a small amount of blood in your stool.  You may pass gas and have  mild abdominal cramping or bloating due to the air that was used to inflate your colon during the exam.  It is up to you to get the results of your procedure. Ask your health care provider, or the department performing the procedure, when your results will be ready. This information is not intended to replace advice given to you by your health care provider. Make sure you discuss any questions you have with your health care provider. Document Released: 05/20/2000 Document Revised: 03/23/2016 Document Reviewed: 08/04/2015 Elsevier Interactive Patient Education  2018 [employer].

## 2018-01-01 ENCOUNTER — Encounter (HOSPITAL_COMMUNITY): Payer: Self-pay

## 2018-01-01 ENCOUNTER — Telehealth: Payer: Self-pay

## 2018-01-01 ENCOUNTER — Encounter (HOSPITAL_COMMUNITY)
Admission: RE | Admit: 2018-01-01 | Discharge: 2018-01-01 | Disposition: A | Discharge status: Home / Self Care | Payer: Medicare Other | Source: Ambulatory Visit | Attending: Gastroenterology | Admitting: Gastroenterology

## 2018-01-01 ENCOUNTER — Other Ambulatory Visit (HOSPITAL_COMMUNITY): Payer: Medicare Other

## 2018-01-01 DIAGNOSIS — Z79891 Long term (current) use of opiate analgesic: Secondary | ICD-10-CM | POA: Diagnosis not present

## 2018-01-01 DIAGNOSIS — M545 Low back pain: Secondary | ICD-10-CM | POA: Diagnosis not present

## 2018-01-01 NOTE — Telephone Encounter (Signed)
Pt called office to cancel upcoming TCS for 01/08/18. States he's busy and can't make it. LMOVM and informed Endo scheduler to cancel procedure.

## 2018-01-01 NOTE — Telephone Encounter (Signed)
Positive cologuard found in 09/2017. Would advise he reschedule as soon as possible. Cannot rule out underlying colon cancer.

## 2018-01-01 NOTE — Telephone Encounter (Signed)
Letter mailed to pt.  

## 2018-01-08 ENCOUNTER — Encounter (HOSPITAL_COMMUNITY): Payer: Self-pay

## 2018-01-08 ENCOUNTER — Ambulatory Visit (HOSPITAL_COMMUNITY): Admit: 2018-01-08 | Payer: Medicare Other | Admitting: Gastroenterology

## 2018-01-08 SURGERY — COLONOSCOPY WITH PROPOFOL
Anesthesia: Monitor Anesthesia Care

## 2018-01-15 DIAGNOSIS — G894 Chronic pain syndrome: Secondary | ICD-10-CM | POA: Diagnosis not present

## 2018-01-15 DIAGNOSIS — M545 Low back pain: Secondary | ICD-10-CM | POA: Diagnosis not present

## 2018-01-15 DIAGNOSIS — Z79891 Long term (current) use of opiate analgesic: Secondary | ICD-10-CM | POA: Diagnosis not present

## 2018-01-19 DIAGNOSIS — G8929 Other chronic pain: Secondary | ICD-10-CM | POA: Diagnosis not present

## 2018-01-19 DIAGNOSIS — R195 Other fecal abnormalities: Secondary | ICD-10-CM | POA: Diagnosis not present

## 2018-01-19 DIAGNOSIS — I10 Essential (primary) hypertension: Secondary | ICD-10-CM | POA: Diagnosis not present

## 2018-01-26 ENCOUNTER — Ambulatory Visit (INDEPENDENT_AMBULATORY_CARE_PROVIDER_SITE_OTHER): Payer: Medicare Other

## 2018-01-26 ENCOUNTER — Ambulatory Visit (INDEPENDENT_AMBULATORY_CARE_PROVIDER_SITE_OTHER): Payer: Medicare Other | Admitting: Orthopedic Surgery

## 2018-01-26 VITALS — BP 95/52 | HR 83 | Ht 70.0 in | Wt 190.0 lb

## 2018-01-26 DIAGNOSIS — Z96651 Presence of right artificial knee joint: Secondary | ICD-10-CM

## 2018-01-26 NOTE — Progress Notes (Signed)
ANNUAL FOLLOW UP FOR  Right  TKA   Chief Complaint  Patient presents with  . Follow-up    Recheck on right knee replacement, DOS 11-04-15.     HPI: The patient is here for the annual  follow-up x-ray for knee replacement. The patient is not complaining of pain weakness instability or stiffness in the repaired knee.   He has no complaints he has a new [place] knee  Review of Systems  Constitutional: Negative for chills and fever.    Past Medical History:  Diagnosis Date  . Cancer ([place])    skin  . EtOH dependence ([place])   . Hepatitis C    successfully eradicated with Harvoni in 2018 with SVR documented  . Shoulder impingement 2004     Examination of the right KNEE  BP (!) 95/52   Pulse 83   Ht 5\' 10"  (1.778 m)   Wt 190 lb (86.2 kg)   BMI 27.26 kg/m   General the patient is normally groomed in no distress  Mood normal Affect pleasant   The patient is Awake and alert ; oriented normal   Inspection shows : incision healed nicely without erythema, no tenderness no swelling  Range of motion total range of motion is 120  Stability the knee is stable anterior to posterior as well as medial to lateral  Strength quadriceps strength is normal  Skin no erythema around the skin incision  Cardiovascular NO EDEMA   Neuro: normal sensation in the operative leg   Gait: normal expected gait without cane    Medical decision-making section  X-rays ordered with the following personal interpretation  Normal alignment without loosening   Diagnosis  Encounter Diagnosis  Name Primary?  . S/P total knee replacement, right 11/04/15 Yes   Plan patient will come in on an as-needed basis

## 2018-01-29 DIAGNOSIS — M545 Low back pain: Secondary | ICD-10-CM | POA: Diagnosis not present

## 2018-01-29 DIAGNOSIS — Z79891 Long term (current) use of opiate analgesic: Secondary | ICD-10-CM | POA: Diagnosis not present

## 2018-02-12 DIAGNOSIS — M545 Low back pain: Secondary | ICD-10-CM | POA: Diagnosis not present

## 2018-02-12 DIAGNOSIS — Z79891 Long term (current) use of opiate analgesic: Secondary | ICD-10-CM | POA: Diagnosis not present

## 2018-02-12 DIAGNOSIS — G894 Chronic pain syndrome: Secondary | ICD-10-CM | POA: Diagnosis not present

## 2018-03-12 DIAGNOSIS — M545 Low back pain: Secondary | ICD-10-CM | POA: Diagnosis not present

## 2018-03-12 DIAGNOSIS — Z79891 Long term (current) use of opiate analgesic: Secondary | ICD-10-CM | POA: Diagnosis not present

## 2018-03-21 ENCOUNTER — Telehealth: Payer: Self-pay | Admitting: Gastroenterology

## 2018-03-21 NOTE — Telephone Encounter (Signed)
RECALL FOR ULTRASOUND 

## 2018-03-21 NOTE — Telephone Encounter (Signed)
Recall letter mailed 

## 2018-04-09 DIAGNOSIS — G894 Chronic pain syndrome: Secondary | ICD-10-CM | POA: Diagnosis not present

## 2018-04-09 DIAGNOSIS — M545 Low back pain: Secondary | ICD-10-CM | POA: Diagnosis not present

## 2018-04-09 DIAGNOSIS — Z79891 Long term (current) use of opiate analgesic: Secondary | ICD-10-CM | POA: Diagnosis not present

## 2018-04-24 ENCOUNTER — Telehealth: Payer: Self-pay | Admitting: Gastroenterology

## 2018-04-24 DIAGNOSIS — K7460 Unspecified cirrhosis of liver: Secondary | ICD-10-CM

## 2018-04-24 NOTE — Telephone Encounter (Signed)
Patient aware of appt details. 

## 2018-04-24 NOTE — Telephone Encounter (Signed)
Pt called to schedule his U/S and asked to have it done next week as early as possible. [phone]

## 2018-04-24 NOTE — Telephone Encounter (Signed)
Ultrasound scheduled for 05/02/18 at 8:30am, arrival time 8:15am, NPO after midnight.  Called pt, NA, VM not set up.

## 2018-04-27 DIAGNOSIS — I10 Essential (primary) hypertension: Secondary | ICD-10-CM | POA: Diagnosis not present

## 2018-05-02 ENCOUNTER — Ambulatory Visit (HOSPITAL_COMMUNITY)
Admission: RE | Admit: 2018-05-02 | Discharge: 2018-05-02 | Disposition: A | Discharge status: Home / Self Care | Payer: Medicare Other | Source: Ambulatory Visit | Attending: Gastroenterology | Admitting: Gastroenterology

## 2018-05-02 DIAGNOSIS — K7460 Unspecified cirrhosis of liver: Secondary | ICD-10-CM

## 2018-05-02 DIAGNOSIS — Z1289 Encounter for screening for malignant neoplasm of other sites: Secondary | ICD-10-CM | POA: Insufficient documentation

## 2018-05-02 DIAGNOSIS — K8020 Calculus of gallbladder without cholecystitis without obstruction: Secondary | ICD-10-CM | POA: Insufficient documentation

## 2018-05-02 DIAGNOSIS — K838 Other specified diseases of biliary tract: Secondary | ICD-10-CM | POA: Insufficient documentation

## 2018-05-07 DIAGNOSIS — Z79891 Long term (current) use of opiate analgesic: Secondary | ICD-10-CM | POA: Diagnosis not present

## 2018-05-07 DIAGNOSIS — G894 Chronic pain syndrome: Secondary | ICD-10-CM | POA: Diagnosis not present

## 2018-05-07 DIAGNOSIS — M545 Low back pain: Secondary | ICD-10-CM | POA: Diagnosis not present

## 2018-05-22 ENCOUNTER — Other Ambulatory Visit: Payer: Self-pay

## 2018-05-22 DIAGNOSIS — K739 Chronic hepatitis, unspecified: Secondary | ICD-10-CM

## 2018-05-22 DIAGNOSIS — K7400 Hepatic fibrosis, unspecified: Secondary | ICD-10-CM

## 2018-05-22 NOTE — Progress Notes (Signed)
ON RECALL FOR SL[facility] FOLLOW UP

## 2018-05-22 NOTE — Progress Notes (Signed)
ON RECALL  °

## 2018-05-22 NOTE — Progress Notes (Signed)
Mailed letter with results and lab orders. Asked pt to call and let us know if OK to schedule the colonoscopy. Forwarding to [place] to nic the next [place] in 6 months.

## 2018-05-22 NOTE — Progress Notes (Signed)
Called, Vm not set up.  Mailing a letter to call.  Will mail lab orders to do now.

## 2018-06-04 DIAGNOSIS — M545 Low back pain: Secondary | ICD-10-CM | POA: Diagnosis not present

## 2018-06-04 DIAGNOSIS — Z79891 Long term (current) use of opiate analgesic: Secondary | ICD-10-CM | POA: Diagnosis not present

## 2018-06-26 ENCOUNTER — Encounter: Payer: Self-pay | Admitting: Gastroenterology

## 2018-07-02 DIAGNOSIS — M545 Low back pain: Secondary | ICD-10-CM | POA: Diagnosis not present

## 2018-07-02 DIAGNOSIS — Z79891 Long term (current) use of opiate analgesic: Secondary | ICD-10-CM | POA: Diagnosis not present

## 2018-07-30 DIAGNOSIS — M545 Low back pain: Secondary | ICD-10-CM | POA: Diagnosis not present

## 2018-07-30 DIAGNOSIS — Z79891 Long term (current) use of opiate analgesic: Secondary | ICD-10-CM | POA: Diagnosis not present

## 2018-07-30 DIAGNOSIS — G894 Chronic pain syndrome: Secondary | ICD-10-CM | POA: Diagnosis not present

## 2018-08-10 DIAGNOSIS — I714 Abdominal aortic aneurysm, without rupture: Secondary | ICD-10-CM | POA: Diagnosis not present

## 2018-08-10 DIAGNOSIS — Z1389 Encounter for screening for other disorder: Secondary | ICD-10-CM | POA: Diagnosis not present

## 2018-08-10 DIAGNOSIS — Z0001 Encounter for general adult medical examination with abnormal findings: Secondary | ICD-10-CM | POA: Diagnosis not present

## 2018-08-10 DIAGNOSIS — I10 Essential (primary) hypertension: Secondary | ICD-10-CM | POA: Diagnosis not present

## 2018-08-27 DIAGNOSIS — G894 Chronic pain syndrome: Secondary | ICD-10-CM | POA: Diagnosis not present

## 2018-08-27 DIAGNOSIS — Z79891 Long term (current) use of opiate analgesic: Secondary | ICD-10-CM | POA: Diagnosis not present

## 2018-08-27 DIAGNOSIS — M545 Low back pain: Secondary | ICD-10-CM | POA: Diagnosis not present

## 2018-09-24 DIAGNOSIS — G894 Chronic pain syndrome: Secondary | ICD-10-CM | POA: Diagnosis not present

## 2018-09-24 DIAGNOSIS — Z79891 Long term (current) use of opiate analgesic: Secondary | ICD-10-CM | POA: Diagnosis not present

## 2018-09-24 DIAGNOSIS — M545 Low back pain: Secondary | ICD-10-CM | POA: Diagnosis not present

## 2018-10-22 DIAGNOSIS — M545 Low back pain: Secondary | ICD-10-CM | POA: Diagnosis not present

## 2018-10-22 DIAGNOSIS — Z79891 Long term (current) use of opiate analgesic: Secondary | ICD-10-CM | POA: Diagnosis not present

## 2018-10-22 DIAGNOSIS — G894 Chronic pain syndrome: Secondary | ICD-10-CM | POA: Diagnosis not present

## 2018-11-07 ENCOUNTER — Telehealth: Payer: Self-pay | Admitting: Gastroenterology

## 2018-11-07 NOTE — Telephone Encounter (Signed)
Recall for ultrasound 

## 2018-11-07 NOTE — Telephone Encounter (Signed)
Recall sent 

## 2018-11-09 DIAGNOSIS — I714 Abdominal aortic aneurysm, without rupture: Secondary | ICD-10-CM | POA: Diagnosis not present

## 2018-11-09 DIAGNOSIS — G8929 Other chronic pain: Secondary | ICD-10-CM | POA: Diagnosis not present

## 2018-11-09 DIAGNOSIS — M1990 Unspecified osteoarthritis, unspecified site: Secondary | ICD-10-CM | POA: Diagnosis not present

## 2018-11-09 DIAGNOSIS — I10 Essential (primary) hypertension: Secondary | ICD-10-CM | POA: Diagnosis not present

## 2018-11-09 DIAGNOSIS — Z0000 Encounter for general adult medical examination without abnormal findings: Secondary | ICD-10-CM | POA: Diagnosis not present

## 2018-11-19 DIAGNOSIS — M545 Low back pain: Secondary | ICD-10-CM | POA: Diagnosis not present

## 2018-11-19 DIAGNOSIS — Z79891 Long term (current) use of opiate analgesic: Secondary | ICD-10-CM | POA: Diagnosis not present

## 2018-11-19 DIAGNOSIS — G894 Chronic pain syndrome: Secondary | ICD-10-CM | POA: Diagnosis not present

## 2018-12-09 DIAGNOSIS — I10 Essential (primary) hypertension: Secondary | ICD-10-CM | POA: Diagnosis not present

## 2018-12-09 DIAGNOSIS — M1990 Unspecified osteoarthritis, unspecified site: Secondary | ICD-10-CM | POA: Diagnosis not present

## 2018-12-17 DIAGNOSIS — G894 Chronic pain syndrome: Secondary | ICD-10-CM | POA: Diagnosis not present

## 2018-12-17 DIAGNOSIS — M545 Low back pain: Secondary | ICD-10-CM | POA: Diagnosis not present

## 2018-12-17 DIAGNOSIS — Z79891 Long term (current) use of opiate analgesic: Secondary | ICD-10-CM | POA: Diagnosis not present

## 2019-01-09 DIAGNOSIS — I10 Essential (primary) hypertension: Secondary | ICD-10-CM | POA: Diagnosis not present

## 2019-01-09 DIAGNOSIS — I714 Abdominal aortic aneurysm, without rupture: Secondary | ICD-10-CM | POA: Diagnosis not present

## 2019-01-14 DIAGNOSIS — Z79891 Long term (current) use of opiate analgesic: Secondary | ICD-10-CM | POA: Diagnosis not present

## 2019-01-14 DIAGNOSIS — G894 Chronic pain syndrome: Secondary | ICD-10-CM | POA: Diagnosis not present

## 2019-01-14 DIAGNOSIS — M545 Low back pain: Secondary | ICD-10-CM | POA: Diagnosis not present

## 2019-02-09 DIAGNOSIS — I10 Essential (primary) hypertension: Secondary | ICD-10-CM | POA: Diagnosis not present

## 2019-02-09 DIAGNOSIS — I714 Abdominal aortic aneurysm, without rupture: Secondary | ICD-10-CM | POA: Diagnosis not present

## 2019-02-12 DIAGNOSIS — Z79891 Long term (current) use of opiate analgesic: Secondary | ICD-10-CM | POA: Diagnosis not present

## 2019-02-12 DIAGNOSIS — G894 Chronic pain syndrome: Secondary | ICD-10-CM | POA: Diagnosis not present

## 2019-02-12 DIAGNOSIS — M545 Low back pain: Secondary | ICD-10-CM | POA: Diagnosis not present

## 2019-03-11 DIAGNOSIS — Z79891 Long term (current) use of opiate analgesic: Secondary | ICD-10-CM | POA: Diagnosis not present

## 2019-03-11 DIAGNOSIS — I714 Abdominal aortic aneurysm, without rupture: Secondary | ICD-10-CM | POA: Diagnosis not present

## 2019-03-11 DIAGNOSIS — M545 Low back pain: Secondary | ICD-10-CM | POA: Diagnosis not present

## 2019-03-11 DIAGNOSIS — I10 Essential (primary) hypertension: Secondary | ICD-10-CM | POA: Diagnosis not present

## 2019-03-11 DIAGNOSIS — G894 Chronic pain syndrome: Secondary | ICD-10-CM | POA: Diagnosis not present

## 2019-03-11 DIAGNOSIS — R5383 Other fatigue: Secondary | ICD-10-CM | POA: Diagnosis not present

## 2019-04-08 DIAGNOSIS — G894 Chronic pain syndrome: Secondary | ICD-10-CM | POA: Diagnosis not present

## 2019-04-08 DIAGNOSIS — Z79891 Long term (current) use of opiate analgesic: Secondary | ICD-10-CM | POA: Diagnosis not present

## 2019-04-08 DIAGNOSIS — R5383 Other fatigue: Secondary | ICD-10-CM | POA: Diagnosis not present

## 2019-04-08 DIAGNOSIS — M545 Low back pain: Secondary | ICD-10-CM | POA: Diagnosis not present

## 2019-04-11 DIAGNOSIS — M1990 Unspecified osteoarthritis, unspecified site: Secondary | ICD-10-CM | POA: Diagnosis not present

## 2019-04-11 DIAGNOSIS — I10 Essential (primary) hypertension: Secondary | ICD-10-CM | POA: Diagnosis not present

## 2019-04-19 ENCOUNTER — Other Ambulatory Visit: Payer: Self-pay | Admitting: Internal Medicine

## 2019-04-19 ENCOUNTER — Other Ambulatory Visit (HOSPITAL_COMMUNITY): Payer: Self-pay | Admitting: Internal Medicine

## 2019-04-19 DIAGNOSIS — N1831 Chronic kidney disease, stage 3a: Secondary | ICD-10-CM | POA: Diagnosis not present

## 2019-04-19 DIAGNOSIS — I10 Essential (primary) hypertension: Secondary | ICD-10-CM | POA: Diagnosis not present

## 2019-04-19 DIAGNOSIS — Z122 Encounter for screening for malignant neoplasm of respiratory organs: Secondary | ICD-10-CM

## 2019-04-24 ENCOUNTER — Other Ambulatory Visit (HOSPITAL_COMMUNITY): Payer: Self-pay | Admitting: Internal Medicine

## 2019-04-24 ENCOUNTER — Other Ambulatory Visit: Payer: Self-pay | Admitting: Internal Medicine

## 2019-04-24 DIAGNOSIS — N183 Chronic kidney disease, stage 3 unspecified: Secondary | ICD-10-CM

## 2019-04-29 ENCOUNTER — Ambulatory Visit (HOSPITAL_COMMUNITY)
Admission: RE | Admit: 2019-04-29 | Discharge: 2019-04-29 | Disposition: A | Discharge status: Home / Self Care | Payer: Medicare Other | Source: Ambulatory Visit | Attending: Internal Medicine | Admitting: Internal Medicine

## 2019-04-29 ENCOUNTER — Other Ambulatory Visit: Payer: Self-pay

## 2019-04-29 DIAGNOSIS — N183 Chronic kidney disease, stage 3 unspecified: Secondary | ICD-10-CM | POA: Diagnosis not present

## 2019-05-06 DIAGNOSIS — R5383 Other fatigue: Secondary | ICD-10-CM | POA: Diagnosis not present

## 2019-05-06 DIAGNOSIS — M545 Low back pain: Secondary | ICD-10-CM | POA: Diagnosis not present

## 2019-05-06 DIAGNOSIS — G894 Chronic pain syndrome: Secondary | ICD-10-CM | POA: Diagnosis not present

## 2019-05-06 DIAGNOSIS — Z79891 Long term (current) use of opiate analgesic: Secondary | ICD-10-CM | POA: Diagnosis not present

## 2019-05-08 ENCOUNTER — Ambulatory Visit (HOSPITAL_COMMUNITY): Payer: Medicare Other

## 2019-05-19 DIAGNOSIS — M1990 Unspecified osteoarthritis, unspecified site: Secondary | ICD-10-CM | POA: Diagnosis not present

## 2019-05-19 DIAGNOSIS — I10 Essential (primary) hypertension: Secondary | ICD-10-CM | POA: Diagnosis not present

## 2019-05-24 ENCOUNTER — Ambulatory Visit (HOSPITAL_COMMUNITY)
Admission: RE | Admit: 2019-05-24 | Discharge: 2019-05-24 | Disposition: A | Discharge status: Home / Self Care | Payer: Medicare Other | Source: Ambulatory Visit | Attending: Internal Medicine | Admitting: Internal Medicine

## 2019-05-24 ENCOUNTER — Other Ambulatory Visit: Payer: Self-pay

## 2019-05-24 DIAGNOSIS — Z122 Encounter for screening for malignant neoplasm of respiratory organs: Secondary | ICD-10-CM

## 2019-06-03 DIAGNOSIS — R5383 Other fatigue: Secondary | ICD-10-CM | POA: Diagnosis not present

## 2019-06-03 DIAGNOSIS — M545 Low back pain: Secondary | ICD-10-CM | POA: Diagnosis not present

## 2019-06-14 ENCOUNTER — Other Ambulatory Visit: Payer: Self-pay

## 2019-06-14 ENCOUNTER — Ambulatory Visit (HOSPITAL_COMMUNITY)
Admission: RE | Admit: 2019-06-14 | Discharge: 2019-06-14 | Disposition: A | Discharge status: Home / Self Care | Payer: Medicare Other | Source: Ambulatory Visit | Attending: Internal Medicine | Admitting: Internal Medicine

## 2019-06-14 DIAGNOSIS — J439 Emphysema, unspecified: Secondary | ICD-10-CM | POA: Insufficient documentation

## 2019-06-14 DIAGNOSIS — I2510 Atherosclerotic heart disease of native coronary artery without angina pectoris: Secondary | ICD-10-CM | POA: Diagnosis not present

## 2019-06-14 DIAGNOSIS — I700 Atherosclerosis of aorta: Secondary | ICD-10-CM | POA: Diagnosis not present

## 2019-06-14 DIAGNOSIS — F17210 Nicotine dependence, cigarettes, uncomplicated: Secondary | ICD-10-CM | POA: Diagnosis not present

## 2019-06-14 DIAGNOSIS — Z122 Encounter for screening for malignant neoplasm of respiratory organs: Secondary | ICD-10-CM | POA: Insufficient documentation

## 2019-07-01 DIAGNOSIS — G894 Chronic pain syndrome: Secondary | ICD-10-CM | POA: Diagnosis not present

## 2019-07-01 DIAGNOSIS — R5383 Other fatigue: Secondary | ICD-10-CM | POA: Diagnosis not present

## 2019-07-01 DIAGNOSIS — Z79891 Long term (current) use of opiate analgesic: Secondary | ICD-10-CM | POA: Diagnosis not present

## 2019-07-01 DIAGNOSIS — M545 Low back pain: Secondary | ICD-10-CM | POA: Diagnosis not present

## 2019-07-03 ENCOUNTER — Other Ambulatory Visit: Payer: Self-pay | Admitting: Internal Medicine

## 2019-07-03 DIAGNOSIS — Z0001 Encounter for general adult medical examination with abnormal findings: Secondary | ICD-10-CM | POA: Diagnosis not present

## 2019-07-03 DIAGNOSIS — F17210 Nicotine dependence, cigarettes, uncomplicated: Secondary | ICD-10-CM | POA: Diagnosis not present

## 2019-07-03 DIAGNOSIS — Z1389 Encounter for screening for other disorder: Secondary | ICD-10-CM | POA: Diagnosis not present

## 2019-07-03 DIAGNOSIS — M1990 Unspecified osteoarthritis, unspecified site: Secondary | ICD-10-CM | POA: Diagnosis not present

## 2019-07-03 DIAGNOSIS — I719 Aortic aneurysm of unspecified site, without rupture: Secondary | ICD-10-CM

## 2019-07-03 DIAGNOSIS — I714 Abdominal aortic aneurysm, without rupture: Secondary | ICD-10-CM | POA: Diagnosis not present

## 2019-07-03 DIAGNOSIS — I10 Essential (primary) hypertension: Secondary | ICD-10-CM | POA: Diagnosis not present

## 2019-07-04 DIAGNOSIS — I129 Hypertensive chronic kidney disease with stage 1 through stage 4 chronic kidney disease, or unspecified chronic kidney disease: Secondary | ICD-10-CM | POA: Diagnosis not present

## 2019-07-04 DIAGNOSIS — R809 Proteinuria, unspecified: Secondary | ICD-10-CM | POA: Diagnosis not present

## 2019-07-04 DIAGNOSIS — N1832 Chronic kidney disease, stage 3b: Secondary | ICD-10-CM | POA: Diagnosis not present

## 2019-07-04 DIAGNOSIS — E875 Hyperkalemia: Secondary | ICD-10-CM | POA: Diagnosis not present

## 2019-07-04 DIAGNOSIS — E871 Hypo-osmolality and hyponatremia: Secondary | ICD-10-CM | POA: Diagnosis not present

## 2019-07-08 ENCOUNTER — Other Ambulatory Visit: Payer: Self-pay

## 2019-07-08 ENCOUNTER — Ambulatory Visit (HOSPITAL_COMMUNITY)
Admission: RE | Admit: 2019-07-08 | Discharge: 2019-07-08 | Disposition: A | Discharge status: Home / Self Care | Payer: Medicare Other | Source: Ambulatory Visit | Attending: Internal Medicine | Admitting: Internal Medicine

## 2019-07-08 DIAGNOSIS — Z136 Encounter for screening for cardiovascular disorders: Secondary | ICD-10-CM | POA: Diagnosis not present

## 2019-07-08 DIAGNOSIS — I719 Aortic aneurysm of unspecified site, without rupture: Secondary | ICD-10-CM | POA: Diagnosis not present

## 2019-07-29 DIAGNOSIS — R5383 Other fatigue: Secondary | ICD-10-CM | POA: Diagnosis not present

## 2019-07-29 DIAGNOSIS — M545 Low back pain: Secondary | ICD-10-CM | POA: Diagnosis not present

## 2019-07-29 DIAGNOSIS — G894 Chronic pain syndrome: Secondary | ICD-10-CM | POA: Diagnosis not present

## 2019-07-29 DIAGNOSIS — Z79891 Long term (current) use of opiate analgesic: Secondary | ICD-10-CM | POA: Diagnosis not present

## 2019-08-02 DIAGNOSIS — I129 Hypertensive chronic kidney disease with stage 1 through stage 4 chronic kidney disease, or unspecified chronic kidney disease: Secondary | ICD-10-CM | POA: Diagnosis not present

## 2019-08-02 DIAGNOSIS — E875 Hyperkalemia: Secondary | ICD-10-CM | POA: Diagnosis not present

## 2019-08-02 DIAGNOSIS — N1832 Chronic kidney disease, stage 3b: Secondary | ICD-10-CM | POA: Diagnosis not present

## 2019-08-02 DIAGNOSIS — E871 Hypo-osmolality and hyponatremia: Secondary | ICD-10-CM | POA: Diagnosis not present

## 2019-08-02 DIAGNOSIS — Z79899 Other long term (current) drug therapy: Secondary | ICD-10-CM | POA: Diagnosis not present

## 2019-08-02 DIAGNOSIS — E559 Vitamin D deficiency, unspecified: Secondary | ICD-10-CM | POA: Diagnosis not present

## 2019-08-02 DIAGNOSIS — R809 Proteinuria, unspecified: Secondary | ICD-10-CM | POA: Diagnosis not present

## 2019-08-02 DIAGNOSIS — N189 Chronic kidney disease, unspecified: Secondary | ICD-10-CM | POA: Diagnosis not present

## 2019-08-03 DIAGNOSIS — M1990 Unspecified osteoarthritis, unspecified site: Secondary | ICD-10-CM | POA: Diagnosis not present

## 2019-08-03 DIAGNOSIS — I10 Essential (primary) hypertension: Secondary | ICD-10-CM | POA: Diagnosis not present

## 2019-08-16 DIAGNOSIS — E875 Hyperkalemia: Secondary | ICD-10-CM | POA: Diagnosis not present

## 2019-08-16 DIAGNOSIS — E871 Hypo-osmolality and hyponatremia: Secondary | ICD-10-CM | POA: Diagnosis not present

## 2019-08-16 DIAGNOSIS — N1831 Chronic kidney disease, stage 3a: Secondary | ICD-10-CM | POA: Diagnosis not present

## 2019-08-16 DIAGNOSIS — E559 Vitamin D deficiency, unspecified: Secondary | ICD-10-CM | POA: Diagnosis not present

## 2019-08-16 DIAGNOSIS — I129 Hypertensive chronic kidney disease with stage 1 through stage 4 chronic kidney disease, or unspecified chronic kidney disease: Secondary | ICD-10-CM | POA: Diagnosis not present

## 2019-08-30 DIAGNOSIS — N1831 Chronic kidney disease, stage 3a: Secondary | ICD-10-CM | POA: Diagnosis not present

## 2019-08-30 DIAGNOSIS — E871 Hypo-osmolality and hyponatremia: Secondary | ICD-10-CM | POA: Diagnosis not present

## 2019-08-30 DIAGNOSIS — E559 Vitamin D deficiency, unspecified: Secondary | ICD-10-CM | POA: Diagnosis not present

## 2019-08-30 DIAGNOSIS — E875 Hyperkalemia: Secondary | ICD-10-CM | POA: Diagnosis not present

## 2019-08-30 DIAGNOSIS — I129 Hypertensive chronic kidney disease with stage 1 through stage 4 chronic kidney disease, or unspecified chronic kidney disease: Secondary | ICD-10-CM | POA: Diagnosis not present

## 2019-08-31 DIAGNOSIS — I10 Essential (primary) hypertension: Secondary | ICD-10-CM | POA: Diagnosis not present

## 2019-08-31 DIAGNOSIS — M1990 Unspecified osteoarthritis, unspecified site: Secondary | ICD-10-CM | POA: Diagnosis not present

## 2019-09-23 DIAGNOSIS — G894 Chronic pain syndrome: Secondary | ICD-10-CM | POA: Diagnosis not present

## 2019-09-23 DIAGNOSIS — M545 Low back pain: Secondary | ICD-10-CM | POA: Diagnosis not present

## 2019-09-23 DIAGNOSIS — Z79891 Long term (current) use of opiate analgesic: Secondary | ICD-10-CM | POA: Diagnosis not present

## 2019-10-01 DIAGNOSIS — I10 Essential (primary) hypertension: Secondary | ICD-10-CM | POA: Diagnosis not present

## 2019-10-01 DIAGNOSIS — M1990 Unspecified osteoarthritis, unspecified site: Secondary | ICD-10-CM | POA: Diagnosis not present

## 2019-10-31 DIAGNOSIS — I10 Essential (primary) hypertension: Secondary | ICD-10-CM | POA: Diagnosis not present

## 2019-10-31 DIAGNOSIS — M1990 Unspecified osteoarthritis, unspecified site: Secondary | ICD-10-CM | POA: Diagnosis not present

## 2019-11-08 DIAGNOSIS — E875 Hyperkalemia: Secondary | ICD-10-CM | POA: Diagnosis not present

## 2019-11-08 DIAGNOSIS — E871 Hypo-osmolality and hyponatremia: Secondary | ICD-10-CM | POA: Diagnosis not present

## 2019-11-08 DIAGNOSIS — I129 Hypertensive chronic kidney disease with stage 1 through stage 4 chronic kidney disease, or unspecified chronic kidney disease: Secondary | ICD-10-CM | POA: Diagnosis not present

## 2019-11-08 DIAGNOSIS — N1831 Chronic kidney disease, stage 3a: Secondary | ICD-10-CM | POA: Diagnosis not present

## 2019-11-08 DIAGNOSIS — E559 Vitamin D deficiency, unspecified: Secondary | ICD-10-CM | POA: Diagnosis not present

## 2019-11-15 DIAGNOSIS — E875 Hyperkalemia: Secondary | ICD-10-CM | POA: Diagnosis not present

## 2019-11-15 DIAGNOSIS — E871 Hypo-osmolality and hyponatremia: Secondary | ICD-10-CM | POA: Diagnosis not present

## 2019-11-15 DIAGNOSIS — N1831 Chronic kidney disease, stage 3a: Secondary | ICD-10-CM | POA: Diagnosis not present

## 2019-11-15 DIAGNOSIS — I129 Hypertensive chronic kidney disease with stage 1 through stage 4 chronic kidney disease, or unspecified chronic kidney disease: Secondary | ICD-10-CM | POA: Diagnosis not present

## 2019-11-15 DIAGNOSIS — D638 Anemia in other chronic diseases classified elsewhere: Secondary | ICD-10-CM | POA: Diagnosis not present

## 2019-11-18 DIAGNOSIS — G894 Chronic pain syndrome: Secondary | ICD-10-CM | POA: Diagnosis not present

## 2019-11-18 DIAGNOSIS — Z79891 Long term (current) use of opiate analgesic: Secondary | ICD-10-CM | POA: Diagnosis not present

## 2019-12-01 DIAGNOSIS — I10 Essential (primary) hypertension: Secondary | ICD-10-CM | POA: Diagnosis not present

## 2019-12-01 DIAGNOSIS — M1990 Unspecified osteoarthritis, unspecified site: Secondary | ICD-10-CM | POA: Diagnosis not present

## 2019-12-06 DIAGNOSIS — E871 Hypo-osmolality and hyponatremia: Secondary | ICD-10-CM | POA: Diagnosis not present

## 2019-12-06 DIAGNOSIS — I129 Hypertensive chronic kidney disease with stage 1 through stage 4 chronic kidney disease, or unspecified chronic kidney disease: Secondary | ICD-10-CM | POA: Diagnosis not present

## 2019-12-06 DIAGNOSIS — E875 Hyperkalemia: Secondary | ICD-10-CM | POA: Diagnosis not present

## 2019-12-06 DIAGNOSIS — N1831 Chronic kidney disease, stage 3a: Secondary | ICD-10-CM | POA: Diagnosis not present

## 2019-12-06 DIAGNOSIS — E559 Vitamin D deficiency, unspecified: Secondary | ICD-10-CM | POA: Diagnosis not present

## 2019-12-27 DIAGNOSIS — I10 Essential (primary) hypertension: Secondary | ICD-10-CM | POA: Diagnosis not present

## 2019-12-27 DIAGNOSIS — F17200 Nicotine dependence, unspecified, uncomplicated: Secondary | ICD-10-CM | POA: Diagnosis not present

## 2019-12-27 DIAGNOSIS — F17210 Nicotine dependence, cigarettes, uncomplicated: Secondary | ICD-10-CM | POA: Diagnosis not present

## 2019-12-27 DIAGNOSIS — N1831 Chronic kidney disease, stage 3a: Secondary | ICD-10-CM | POA: Diagnosis not present

## 2020-01-09 DIAGNOSIS — E875 Hyperkalemia: Secondary | ICD-10-CM | POA: Diagnosis not present

## 2020-01-09 DIAGNOSIS — N1831 Chronic kidney disease, stage 3a: Secondary | ICD-10-CM | POA: Diagnosis not present

## 2020-01-09 DIAGNOSIS — E559 Vitamin D deficiency, unspecified: Secondary | ICD-10-CM | POA: Diagnosis not present

## 2020-01-09 DIAGNOSIS — I129 Hypertensive chronic kidney disease with stage 1 through stage 4 chronic kidney disease, or unspecified chronic kidney disease: Secondary | ICD-10-CM | POA: Diagnosis not present

## 2020-01-09 DIAGNOSIS — E871 Hypo-osmolality and hyponatremia: Secondary | ICD-10-CM | POA: Diagnosis not present

## 2020-01-13 DIAGNOSIS — Z79891 Long term (current) use of opiate analgesic: Secondary | ICD-10-CM | POA: Diagnosis not present

## 2020-01-13 DIAGNOSIS — G894 Chronic pain syndrome: Secondary | ICD-10-CM | POA: Diagnosis not present

## 2020-01-17 DIAGNOSIS — I129 Hypertensive chronic kidney disease with stage 1 through stage 4 chronic kidney disease, or unspecified chronic kidney disease: Secondary | ICD-10-CM | POA: Diagnosis not present

## 2020-01-17 DIAGNOSIS — E875 Hyperkalemia: Secondary | ICD-10-CM | POA: Diagnosis not present

## 2020-01-17 DIAGNOSIS — N1831 Chronic kidney disease, stage 3a: Secondary | ICD-10-CM | POA: Diagnosis not present

## 2020-01-17 DIAGNOSIS — E871 Hypo-osmolality and hyponatremia: Secondary | ICD-10-CM | POA: Diagnosis not present

## 2020-01-27 DIAGNOSIS — I10 Essential (primary) hypertension: Secondary | ICD-10-CM | POA: Diagnosis not present

## 2020-01-27 DIAGNOSIS — M1990 Unspecified osteoarthritis, unspecified site: Secondary | ICD-10-CM | POA: Diagnosis not present

## 2020-02-27 DIAGNOSIS — M1990 Unspecified osteoarthritis, unspecified site: Secondary | ICD-10-CM | POA: Diagnosis not present

## 2020-02-27 DIAGNOSIS — I10 Essential (primary) hypertension: Secondary | ICD-10-CM | POA: Diagnosis not present

## 2020-03-09 DIAGNOSIS — Z79891 Long term (current) use of opiate analgesic: Secondary | ICD-10-CM | POA: Diagnosis not present

## 2020-03-09 DIAGNOSIS — G894 Chronic pain syndrome: Secondary | ICD-10-CM | POA: Diagnosis not present

## 2020-03-28 DIAGNOSIS — M1990 Unspecified osteoarthritis, unspecified site: Secondary | ICD-10-CM | POA: Diagnosis not present

## 2020-03-28 DIAGNOSIS — G8929 Other chronic pain: Secondary | ICD-10-CM | POA: Diagnosis not present

## 2020-03-28 DIAGNOSIS — I10 Essential (primary) hypertension: Secondary | ICD-10-CM | POA: Diagnosis not present

## 2020-04-10 DIAGNOSIS — I129 Hypertensive chronic kidney disease with stage 1 through stage 4 chronic kidney disease, or unspecified chronic kidney disease: Secondary | ICD-10-CM | POA: Diagnosis not present

## 2020-04-10 DIAGNOSIS — N1831 Chronic kidney disease, stage 3a: Secondary | ICD-10-CM | POA: Diagnosis not present

## 2020-04-10 DIAGNOSIS — E871 Hypo-osmolality and hyponatremia: Secondary | ICD-10-CM | POA: Diagnosis not present

## 2020-04-10 DIAGNOSIS — E875 Hyperkalemia: Secondary | ICD-10-CM | POA: Diagnosis not present

## 2020-04-24 DIAGNOSIS — E559 Vitamin D deficiency, unspecified: Secondary | ICD-10-CM | POA: Diagnosis not present

## 2020-04-24 DIAGNOSIS — E871 Hypo-osmolality and hyponatremia: Secondary | ICD-10-CM | POA: Diagnosis not present

## 2020-04-24 DIAGNOSIS — I129 Hypertensive chronic kidney disease with stage 1 through stage 4 chronic kidney disease, or unspecified chronic kidney disease: Secondary | ICD-10-CM | POA: Diagnosis not present

## 2020-04-24 DIAGNOSIS — N1831 Chronic kidney disease, stage 3a: Secondary | ICD-10-CM | POA: Diagnosis not present

## 2020-04-24 DIAGNOSIS — E875 Hyperkalemia: Secondary | ICD-10-CM | POA: Diagnosis not present

## 2020-04-28 DIAGNOSIS — I10 Essential (primary) hypertension: Secondary | ICD-10-CM | POA: Diagnosis not present

## 2020-04-28 DIAGNOSIS — M1990 Unspecified osteoarthritis, unspecified site: Secondary | ICD-10-CM | POA: Diagnosis not present

## 2020-05-04 DIAGNOSIS — G894 Chronic pain syndrome: Secondary | ICD-10-CM | POA: Diagnosis not present

## 2020-05-04 DIAGNOSIS — Z79891 Long term (current) use of opiate analgesic: Secondary | ICD-10-CM | POA: Diagnosis not present

## 2020-05-29 DIAGNOSIS — M1990 Unspecified osteoarthritis, unspecified site: Secondary | ICD-10-CM | POA: Diagnosis not present

## 2020-05-29 DIAGNOSIS — I10 Essential (primary) hypertension: Secondary | ICD-10-CM | POA: Diagnosis not present

## 2020-06-19 ENCOUNTER — Other Ambulatory Visit (HOSPITAL_COMMUNITY): Payer: Self-pay | Admitting: Internal Medicine

## 2020-06-19 ENCOUNTER — Other Ambulatory Visit: Payer: Self-pay | Admitting: Internal Medicine

## 2020-06-19 DIAGNOSIS — Z0001 Encounter for general adult medical examination with abnormal findings: Secondary | ICD-10-CM | POA: Diagnosis not present

## 2020-06-19 DIAGNOSIS — Z1389 Encounter for screening for other disorder: Secondary | ICD-10-CM | POA: Diagnosis not present

## 2020-06-19 DIAGNOSIS — I10 Essential (primary) hypertension: Secondary | ICD-10-CM | POA: Diagnosis not present

## 2020-06-19 DIAGNOSIS — N1832 Chronic kidney disease, stage 3b: Secondary | ICD-10-CM | POA: Diagnosis not present

## 2020-06-19 DIAGNOSIS — F17210 Nicotine dependence, cigarettes, uncomplicated: Secondary | ICD-10-CM | POA: Diagnosis not present

## 2020-06-19 DIAGNOSIS — Z87891 Personal history of nicotine dependence: Secondary | ICD-10-CM

## 2020-06-19 DIAGNOSIS — I714 Abdominal aortic aneurysm, without rupture: Secondary | ICD-10-CM | POA: Diagnosis not present

## 2020-06-29 DIAGNOSIS — G894 Chronic pain syndrome: Secondary | ICD-10-CM | POA: Diagnosis not present

## 2020-06-29 DIAGNOSIS — Z79891 Long term (current) use of opiate analgesic: Secondary | ICD-10-CM | POA: Diagnosis not present

## 2020-07-17 ENCOUNTER — Other Ambulatory Visit: Payer: Self-pay

## 2020-07-17 ENCOUNTER — Ambulatory Visit (HOSPITAL_COMMUNITY)
Admission: RE | Admit: 2020-07-17 | Discharge: 2020-07-17 | Disposition: A | Discharge status: Home / Self Care | Payer: Medicare Other | Source: Ambulatory Visit | Attending: Internal Medicine | Admitting: Internal Medicine

## 2020-07-17 DIAGNOSIS — E875 Hyperkalemia: Secondary | ICD-10-CM | POA: Diagnosis not present

## 2020-07-17 DIAGNOSIS — F17210 Nicotine dependence, cigarettes, uncomplicated: Secondary | ICD-10-CM | POA: Diagnosis not present

## 2020-07-17 DIAGNOSIS — Z87891 Personal history of nicotine dependence: Secondary | ICD-10-CM | POA: Diagnosis not present

## 2020-07-17 DIAGNOSIS — N1831 Chronic kidney disease, stage 3a: Secondary | ICD-10-CM | POA: Diagnosis not present

## 2020-07-17 DIAGNOSIS — I129 Hypertensive chronic kidney disease with stage 1 through stage 4 chronic kidney disease, or unspecified chronic kidney disease: Secondary | ICD-10-CM | POA: Diagnosis not present

## 2020-07-17 DIAGNOSIS — E871 Hypo-osmolality and hyponatremia: Secondary | ICD-10-CM | POA: Diagnosis not present

## 2020-07-17 DIAGNOSIS — E559 Vitamin D deficiency, unspecified: Secondary | ICD-10-CM | POA: Diagnosis not present

## 2020-07-20 DIAGNOSIS — F17200 Nicotine dependence, unspecified, uncomplicated: Secondary | ICD-10-CM | POA: Diagnosis not present

## 2020-07-20 DIAGNOSIS — I10 Essential (primary) hypertension: Secondary | ICD-10-CM | POA: Diagnosis not present

## 2020-07-31 DIAGNOSIS — E875 Hyperkalemia: Secondary | ICD-10-CM | POA: Diagnosis not present

## 2020-07-31 DIAGNOSIS — E559 Vitamin D deficiency, unspecified: Secondary | ICD-10-CM | POA: Diagnosis not present

## 2020-07-31 DIAGNOSIS — I129 Hypertensive chronic kidney disease with stage 1 through stage 4 chronic kidney disease, or unspecified chronic kidney disease: Secondary | ICD-10-CM | POA: Diagnosis not present

## 2020-07-31 DIAGNOSIS — N1832 Chronic kidney disease, stage 3b: Secondary | ICD-10-CM | POA: Diagnosis not present

## 2020-08-07 DIAGNOSIS — E559 Vitamin D deficiency, unspecified: Secondary | ICD-10-CM | POA: Diagnosis not present

## 2020-08-07 DIAGNOSIS — N1832 Chronic kidney disease, stage 3b: Secondary | ICD-10-CM | POA: Diagnosis not present

## 2020-08-07 DIAGNOSIS — I129 Hypertensive chronic kidney disease with stage 1 through stage 4 chronic kidney disease, or unspecified chronic kidney disease: Secondary | ICD-10-CM | POA: Diagnosis not present

## 2020-08-07 DIAGNOSIS — E875 Hyperkalemia: Secondary | ICD-10-CM | POA: Diagnosis not present

## 2020-08-17 DIAGNOSIS — N1832 Chronic kidney disease, stage 3b: Secondary | ICD-10-CM | POA: Diagnosis not present

## 2020-08-17 DIAGNOSIS — Z79899 Other long term (current) drug therapy: Secondary | ICD-10-CM | POA: Diagnosis not present

## 2020-08-17 DIAGNOSIS — I10 Essential (primary) hypertension: Secondary | ICD-10-CM | POA: Diagnosis not present

## 2020-08-17 DIAGNOSIS — Z0001 Encounter for general adult medical examination with abnormal findings: Secondary | ICD-10-CM | POA: Diagnosis not present

## 2020-08-24 DIAGNOSIS — I10 Essential (primary) hypertension: Secondary | ICD-10-CM | POA: Diagnosis not present

## 2020-08-24 DIAGNOSIS — G894 Chronic pain syndrome: Secondary | ICD-10-CM | POA: Diagnosis not present

## 2020-08-24 DIAGNOSIS — Z79891 Long term (current) use of opiate analgesic: Secondary | ICD-10-CM | POA: Diagnosis not present

## 2020-09-17 DIAGNOSIS — N1832 Chronic kidney disease, stage 3b: Secondary | ICD-10-CM | POA: Diagnosis not present

## 2020-09-17 DIAGNOSIS — I10 Essential (primary) hypertension: Secondary | ICD-10-CM | POA: Diagnosis not present

## 2020-09-22 ENCOUNTER — Ambulatory Visit (INDEPENDENT_AMBULATORY_CARE_PROVIDER_SITE_OTHER): Payer: Medicare Other | Admitting: Gastroenterology

## 2020-09-22 ENCOUNTER — Encounter: Payer: Self-pay | Admitting: *Deleted

## 2020-09-22 ENCOUNTER — Other Ambulatory Visit: Payer: Self-pay

## 2020-09-22 ENCOUNTER — Encounter: Payer: Self-pay | Admitting: Gastroenterology

## 2020-09-22 VITALS — BP 116/71 | HR 71 | Temp 97.1°F | Ht 70.0 in | Wt 162.0 lb

## 2020-09-22 DIAGNOSIS — K7400 Hepatic fibrosis, unspecified: Secondary | ICD-10-CM | POA: Diagnosis not present

## 2020-09-22 NOTE — Progress Notes (Signed)
Primary Care Physician: Rosita Fire, MD  Primary Gastroenterologist:  Formerly Dr. Oneida Alar  Chief Complaint  Patient presents with  . vomiting    Better. Thinks vomiting was caused by taking med for high potassium and drinking beer.    HPI: Steven [name] is a 70 y.o. male here for further evaluation of nausea and vomiting.  Patient last seen May 2019.  History of positive Cologuard test, history of hepatitis C status post eradication with documented SVR, dilated common bile duct, abdominal aortic aneurysm.  Previously attempted MRCP but unable to gain IV access after several attempts, patient subsequently refused further attempts.  He was scheduled for colonoscopy in August 2019 for history of positive Cologuard, he called and canceled.  Patient states that he does not want pursue colonoscopy at any time.  Patient completed ultrasound with elastography in March 2018, fibrosis score of F2 with some F3.  Abdominal ultrasound May 2019 with common bile duct prominent at 1.2 cm with history of cholelithiasis, cannot exclude distal common bile duct stone, hepatic steatosis.  Abdominal ultrasound November 2019 echogenic stone near the gallbladder neck measuring 1.1 cm, possible adenomyomatosis, common bile duct 6.9 mm, mildly increased hepatic echotexture, surface contour of the liver remain smooth.  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  Labs from February 2022: White blood cell count 6600, hemoglobin 15, platelets 120,000, creatinine 1.67, estimated GFR 41, potassium 5.5, sodium 136, albumin 4.6.  Today: Patient states he feels better.  He made the appointment originally for nausea and vomiting.  At first he thought it might be his gallbladder acting up.  He denies any associated abdominal pain.  Symptoms occurred after taking Veltassa for hyperkalemia, mixed with alcohol  consumption.  States he stopped Veltassa.  He also quit drinking alcohol.  Vomiting has stopped.  None in the past 2 weeks.  Denies heartburn.  No abdominal pain.  Bowel movements are regular.  No blood in the stool or melena.  Regarding alcohol consumption, he states he was drinking 3-4 beers daily after work most days.  Did not feel like he was drinking excessively.  DUI 10 years ago.  Current Outpatient Medications  Medication Sig Dispense Refill  . buPROPion (WELLBUTRIN XL) 300 MG 24 hr tablet Take 1 tablet by mouth every morning.    . cloNIDine (CATAPRES) 0.1 MG tablet Take 0.1 mg by mouth at bedtime.    . furosemide (LASIX) 20 MG tablet Take 20 mg by mouth daily.    Marland Kitchen gabapentin (NEURONTIN) 600 MG tablet Take 600 mg by mouth 3 (three) times daily.    Marland Kitchen lisinopril (ZESTRIL) 5 MG tablet Take 5 mg by mouth daily.    . SUBOXONE 8-2 MG FILM Take 1.5 Film by mouth daily.     No current facility-administered medications for this visit.    Allergies as of 09/22/2020  . (No Known Allergies)   Past Medical History:  Diagnosis Date  . Cancer ([place])    skin  . EtOH dependence ([place])   . Hepatitis C    successfully eradicated with Harvoni in 2018 with SVR documented  . Shoulder impingement 2004   Past Surgical History:  Procedure Laterality Date  . FRACTURE SURGERY Left    wrist  . SHOULDER ACROMIOPLASTY Right 11/20/2012   Procedure: SHOULDER ACROMIOPLASTY;  Surgeon: Sanjuana Kava, MD;  Location: [facility];  Service: Orthopedics;  Laterality: Right;  .  SKIN CANCER EXCISION Left 2013   wrist surgery fell off a roof  . STOMACH SURGERY     had bleeding ulcers repaired. age 47  . TOTAL KNEE ARTHROPLASTY Right 11/04/2015   Procedure: RIGHT TOTAL KNEE ARTHROPLASTY;  Surgeon: Carole Civil, MD;  Location: [facility];  Service: Orthopedics;  Laterality: Right;   Family History  Problem Relation Age of Onset  . Liver disease Neg Hx   . Colon cancer Neg Hx    Social History   Tobacco Use  .  Smoking status: Current Every Day Smoker    Packs/day: 1.00    Years: 30.00    Pack years: 30.00    Types: Cigars  . Smokeless tobacco: Never Used  Substance Use Topics  . Alcohol use: Yes    Comment: 09/22/20 stopped drinking beer  . Drug use: No    Frequency: 7.0 times per week    Types: Marijuana    Comment: says no  drugs 02/04/2014    ROS:  General: Negative for anorexia, weight loss, fever, chills, fatigue, weakness. ENT: Negative for hoarseness, difficulty swallowing , nasal congestion. CV: Negative for chest pain, angina, palpitations, dyspnea on exertion, peripheral edema.  Respiratory: Negative for dyspnea at rest, dyspnea on exertion, cough, sputum, wheezing.  GI: See history of present illness. GU:  Negative for dysuria, hematuria, urinary incontinence, urinary frequency, nocturnal urination.  Endo: Negative for unusual weight change.    Physical Examination:   BP 116/71   Pulse 71   Temp (!) 97.1 F (36.2 C) (Temporal)   Ht 5\' 10"  (1.778 m)   Wt 162 lb (73.5 kg)   BMI 23.24 kg/m   General: Well-nourished, well-developed in no acute distress.  Eyes: No icterus. Mouth: masked Lungs: Clear to auscultation bilaterally.  Heart: Regular rate and rhythm, no murmurs rubs or gallops.  Abdomen: Bowel sounds are normal, nontender, nondistended, no hepatosplenomegaly or masses, no abdominal bruits or hernia , no rebound or guarding.   Extremities: No lower extremity edema. No clubbing or deformities. Neuro: Alert and oriented x 4   Skin: Warm and dry, no jaundice.   Psych: Alert and cooperative, normal mood and affect.  Labs:  See hpi  Imaging Studies: No results found.  Assessment/plan:  70 year old male with history of hepatic steatosis/fibrosis, prior hepatitis C status post eradication, alcohol use as outlined, history of dilated common bile duct (MRCP not completed because of failed attempts obtain IV access and patient declining further) presenting for  further evaluation of nausea and vomiting.  Nausea and vomiting possibly related to medication as outlined.  Symptoms have resolved.  Doubt biliary etiology given no associated abdominal pain.  Given history of hepatic steatosis/fibrosis, dilated common bile duct, recommend updating abdominal ultrasound at this time.  1. Abdominal ultrasound. 2. Recommend refraining from alcohol use given fatty liver/fibrosis and risk of developing cirrhosis. 3. We will request copy of recent labs from PCP for review. 4. Patient will call if any recurrent nausea/vomiting. 5. Recommended colonoscopy for history of positive Cologuard/patient has never had a colonoscopy, currently declines. 6. He will continue to follow with PCP regarding right common iliac artery aneurysm.

## 2020-09-22 NOTE — Patient Instructions (Signed)
1. Abdominal ultrasound as scheduled.  2. Recommend refraining from alcohol use given your fatty liver/fibrosis and risk of developing cirrhosis.  3. We will request copy of labs from PCP for review.  4. Call if you have recurrent nausea/vomiting

## 2020-09-30 ENCOUNTER — Ambulatory Visit (HOSPITAL_COMMUNITY): Payer: Medicare Other

## 2020-10-02 ENCOUNTER — Ambulatory Visit (HOSPITAL_COMMUNITY)
Admission: RE | Admit: 2020-10-02 | Discharge: 2020-10-02 | Disposition: A | Discharge status: Home / Self Care | Payer: Medicare Other | Source: Ambulatory Visit | Attending: Gastroenterology | Admitting: Gastroenterology

## 2020-10-02 DIAGNOSIS — E875 Hyperkalemia: Secondary | ICD-10-CM | POA: Diagnosis not present

## 2020-10-02 DIAGNOSIS — I129 Hypertensive chronic kidney disease with stage 1 through stage 4 chronic kidney disease, or unspecified chronic kidney disease: Secondary | ICD-10-CM | POA: Diagnosis not present

## 2020-10-02 DIAGNOSIS — K8020 Calculus of gallbladder without cholecystitis without obstruction: Secondary | ICD-10-CM | POA: Diagnosis not present

## 2020-10-02 DIAGNOSIS — E559 Vitamin D deficiency, unspecified: Secondary | ICD-10-CM | POA: Diagnosis not present

## 2020-10-02 DIAGNOSIS — K7400 Hepatic fibrosis, unspecified: Secondary | ICD-10-CM | POA: Insufficient documentation

## 2020-10-02 DIAGNOSIS — N1832 Chronic kidney disease, stage 3b: Secondary | ICD-10-CM | POA: Diagnosis not present

## 2020-10-14 ENCOUNTER — Telehealth: Payer: Self-pay | Admitting: *Deleted

## 2020-10-14 DIAGNOSIS — K838 Other specified diseases of biliary tract: Secondary | ICD-10-CM

## 2020-10-14 DIAGNOSIS — I723 Aneurysm of iliac artery: Secondary | ICD-10-CM

## 2020-10-14 DIAGNOSIS — I714 Abdominal aortic aneurysm, without rupture, unspecified: Secondary | ICD-10-CM

## 2020-10-14 NOTE — Telephone Encounter (Signed)
noted 

## 2020-10-14 NOTE — Telephone Encounter (Signed)
Pt called back.  Informed him of results and recommendations.  Pt said that he would be willing to do MRCP if we prescribed something to calm him down before having it done.  He said that he gets anxiety when it comes to closed in spaces.    Informed him that radiologist recommends consider further evaluation of right common iliac artery aneurysm and mild dilation of distal abdominal aorta.  Explained to him that Dr. Legrand Rams had followed this previously and that he can follow up with PCP or we can refer to vascular for further management.  Pt stated that he wanted to wait and see what Neil Crouch, PA could do for him first.

## 2020-10-15 NOTE — Telephone Encounter (Signed)
Will await advice from [facility] before scheduling MRI.

## 2020-10-16 ENCOUNTER — Other Ambulatory Visit: Payer: Self-pay | Admitting: *Deleted

## 2020-10-16 DIAGNOSIS — K838 Other specified diseases of biliary tract: Secondary | ICD-10-CM

## 2020-10-16 MED ORDER — DIAZEPAM 10 MG PO TABS: 10.0000 mg | ORAL_TABLET | Freq: Once | ORAL | 0 refills | Status: AC

## 2020-10-16 NOTE — Addendum Note (Signed)
Addended by: Hassan Rowan on: 10/16/2020 08:45 AM   Modules accepted: Orders

## 2020-10-16 NOTE — Addendum Note (Signed)
Addended by: Hassan Rowan on: 10/16/2020 09:13 AM   Modules accepted: Orders

## 2020-10-16 NOTE — Telephone Encounter (Signed)
Spoke with pt.  He was made aware of Neil Crouch, PA's recommendations.  Pt is agreeable to the plan of care.  He was made aware that Valium 10 mg was sent to pharmacy and should be taken 30 minutes prior to his MRI.  He was made aware to have a driver for MRCP.  He is agreeable to consultation with vascular.  Pt made aware that he will need creatinine lab drawn prior to MRCP.  Pt voiced understanding to all information given.    [facility] Pool:  Pt is requesting a Friday for MRCP if possible.  Pt is agreeable to vascular referral.

## 2020-10-16 NOTE — Telephone Encounter (Signed)
1. Please schedule MR ABD MRCP W WO CONTRAST. Patient has severe claustrophobia and previously we had this scheduled for open MRI but he never completed because they could not gain IV access 2018.   He will need updated Creatinine prior to his scheduled MRI appt. last labs from 08/18/2020: BUN 24, creatinine 1.43, estimated GFR 50  Dx: dilated CBD  2.  I will send in Valium 10mg  to take 30 minutes prior to his MRI but he will need to have a driver if he takes it.   3.  I would advise consultation with vascular for mild abdominal aorta aneurysm and aneurysmal right common iliac artery . If patient agreeable.   4.    Labs reviewed from August 18, 2020: BUN 24, creatinine 1.43, estimated GFR 50, albumin 4.7, total bilirubin 1, alkaline phosphatase 60, AST 29, ALT 23, white blood cell count 5800, hemoglobin 14.9, platelets 232,000

## 2020-10-16 NOTE — Addendum Note (Signed)
Addended by: Mahala Menghini on: 10/16/2020 08:00 AM   Modules accepted: Orders

## 2020-10-16 NOTE — Telephone Encounter (Signed)
Referral sent to vascular via Epic.  MRI/MRCP scheduled for 10/30/20 at 3:00pm, arrive at 2:45pm. NPO 4 hours prior to test.   Tried to call pt, LMOVM to inform him test had been scheduled and appt letter mailed.  No PA needed for MRI per [facility] website.

## 2020-10-17 DIAGNOSIS — N1832 Chronic kidney disease, stage 3b: Secondary | ICD-10-CM | POA: Diagnosis not present

## 2020-10-17 DIAGNOSIS — I10 Essential (primary) hypertension: Secondary | ICD-10-CM | POA: Diagnosis not present

## 2020-10-19 DIAGNOSIS — G894 Chronic pain syndrome: Secondary | ICD-10-CM | POA: Diagnosis not present

## 2020-10-19 DIAGNOSIS — Z79891 Long term (current) use of opiate analgesic: Secondary | ICD-10-CM | POA: Diagnosis not present

## 2020-10-19 DIAGNOSIS — I10 Essential (primary) hypertension: Secondary | ICD-10-CM | POA: Diagnosis not present

## 2020-10-23 DIAGNOSIS — I952 Hypotension due to drugs: Secondary | ICD-10-CM | POA: Diagnosis not present

## 2020-10-23 DIAGNOSIS — E559 Vitamin D deficiency, unspecified: Secondary | ICD-10-CM | POA: Diagnosis not present

## 2020-10-23 DIAGNOSIS — E875 Hyperkalemia: Secondary | ICD-10-CM | POA: Diagnosis not present

## 2020-10-23 DIAGNOSIS — N1831 Chronic kidney disease, stage 3a: Secondary | ICD-10-CM | POA: Diagnosis not present

## 2020-10-30 ENCOUNTER — Other Ambulatory Visit: Payer: Self-pay

## 2020-10-30 ENCOUNTER — Encounter (HOSPITAL_COMMUNITY): Payer: Self-pay

## 2020-10-30 ENCOUNTER — Ambulatory Visit (HOSPITAL_COMMUNITY)
Admission: RE | Admit: 2020-10-30 | Discharge: 2020-10-30 | Disposition: A | Discharge status: Home / Self Care | Payer: Medicare Other | Source: Ambulatory Visit | Attending: Gastroenterology | Admitting: Gastroenterology

## 2020-10-30 ENCOUNTER — Other Ambulatory Visit: Payer: Self-pay | Admitting: Gastroenterology

## 2020-10-30 DIAGNOSIS — K838 Other specified diseases of biliary tract: Secondary | ICD-10-CM

## 2020-10-30 MED ORDER — GADOBUTROL 1 MMOL/ML IV SOLN: 7.0000 mL | Freq: Once | INTRAVENOUS | Status: DC | PRN

## 2020-11-03 ENCOUNTER — Telehealth: Payer: Self-pay

## 2020-11-03 DIAGNOSIS — K838 Other specified diseases of biliary tract: Secondary | ICD-10-CM

## 2020-11-03 NOTE — Telephone Encounter (Signed)
Documentation might have not been clear, but the request was for open MRI. Like we did before.

## 2020-11-03 NOTE — Telephone Encounter (Signed)
Patient called back to state he needs to be "completely out" in order for him to be able to go through the machine. Patient states he can't be awake for this to be done. Patient unable to complete MRCP scheduled for last week. Fowarding to [place].

## 2020-11-03 NOTE — Telephone Encounter (Signed)
Pt called stating the the valium 10 mg was not enough for him to relax. He didn't complete the MRCP that he was scheduled for. Please advise.

## 2020-11-04 MED ORDER — DIAZEPAM 10 MG PO TABS: 10.0000 mg | ORAL_TABLET | Freq: Once | ORAL | 0 refills | Status: AC

## 2020-11-04 NOTE — Telephone Encounter (Signed)
Called pt and made aware rx sent in. Also aware needs driver for MRI. He voiced understanding

## 2020-11-04 NOTE — Telephone Encounter (Signed)
Previous order cancelled. Placed new order for MRI to be schedule at [facility]. I called pt and made him aware they will call to schedule him. He voiced understanding.  Called [facility] and spoke with [facility] in MRI scheduling. She states she see's the order and will call patient to schedule.

## 2020-11-04 NOTE — Addendum Note (Signed)
Addended by: Cheron Every on: 11/04/2020 07:44 AM   Modules accepted: Orders

## 2020-11-04 NOTE — Addendum Note (Signed)
Addended by: Mahala Menghini on: 11/04/2020 01:10 PM   Modules accepted: Orders

## 2020-11-04 NOTE — Addendum Note (Signed)
Addended by: Cheron Every on: 11/04/2020 08:06 AM   Modules accepted: Orders

## 2020-11-04 NOTE — Telephone Encounter (Signed)
He likely took his Valium last week when attempted MR so I have sent him in new RX. Please let pt know.   Will take valium 10mg  30 minutes before MRI. Will need a driver.

## 2020-11-17 DIAGNOSIS — N1832 Chronic kidney disease, stage 3b: Secondary | ICD-10-CM | POA: Diagnosis not present

## 2020-11-17 DIAGNOSIS — I10 Essential (primary) hypertension: Secondary | ICD-10-CM | POA: Diagnosis not present

## 2020-11-27 ENCOUNTER — Encounter: Payer: Self-pay | Admitting: Vascular Surgery

## 2020-11-27 ENCOUNTER — Ambulatory Visit (INDEPENDENT_AMBULATORY_CARE_PROVIDER_SITE_OTHER): Payer: Medicare Other | Admitting: Vascular Surgery

## 2020-11-27 ENCOUNTER — Other Ambulatory Visit: Payer: Self-pay

## 2020-11-27 VITALS — BP 132/72 | HR 68 | Temp 98.4°F | Resp 20 | Ht 70.0 in | Wt 160.0 lb

## 2020-11-27 DIAGNOSIS — I723 Aneurysm of iliac artery: Secondary | ICD-10-CM

## 2020-11-27 NOTE — Progress Notes (Signed)
Patient ID: Steven [name], male   DOB: 1950-10-13, 70 y.o.   MRN: [id number]  Reason for Consult: No chief complaint on file.   Referred by Rosita Fire, MD  Subjective:     HPI:  Steven [name] is a 70 y.o. male here to evaluate an aneurysm.  He does not know of any previous history of aneurysm no family history of aneurysm.  He denies any history of vascular surgery has never had stroke, TIA or amaurosis does not have any previously diagnosed heart disease.  He is a lifelong smoker with no intention of quitting.  He walks without limitation continues to work daily.  No new back or abdominal pain.  Does not take blood thinners.  Past Medical History:  Diagnosis Date   Cancer ([place])    skin   EtOH dependence ([place])    Hepatitis C    successfully eradicated with Harvoni in 2018 with SVR documented   Shoulder impingement 2004   Family History  Problem Relation Age of Onset   Liver disease Neg Hx    Colon cancer Neg Hx    Past Surgical History:  Procedure Laterality Date   FRACTURE SURGERY Left    wrist   SHOULDER ACROMIOPLASTY Right 11/20/2012   Procedure: SHOULDER ACROMIOPLASTY;  Surgeon: Sanjuana Kava, MD;  Location: [facility];  Service: Orthopedics;  Laterality: Right;   SKIN CANCER EXCISION Left 2013   wrist surgery fell off a roof   STOMACH SURGERY     had bleeding ulcers repaired. age 63   TOTAL KNEE ARTHROPLASTY Right 11/04/2015   Procedure: RIGHT TOTAL KNEE ARTHROPLASTY;  Surgeon: Carole Civil, MD;  Location: [facility];  Service: Orthopedics;  Laterality: Right;    Short Social History:  Social History   Tobacco Use   Smoking status: Every Day    Packs/day: 1.00    Years: 30.00    Pack years: 30.00    Types: Cigars, Cigarettes   Smokeless tobacco: Never  Substance Use Topics   Alcohol use: Yes    Comment: 09/22/20 stopped drinking beer    No Known Allergies  Current Outpatient Medications  Medication Sig Dispense Refill   buPROPion (WELLBUTRIN  XL) 300 MG 24 hr tablet Take 1 tablet by mouth every morning.     cloNIDine (CATAPRES) 0.1 MG tablet Take 0.1 mg by mouth at bedtime.     furosemide (LASIX) 20 MG tablet Take 20 mg by mouth daily.     gabapentin (NEURONTIN) 600 MG tablet Take 600 mg by mouth 3 (three) times daily.     lisinopril (ZESTRIL) 5 MG tablet Take 5 mg by mouth daily.     SUBOXONE 8-2 MG FILM Take 1.5 Film by mouth daily.     No current facility-administered medications for this visit.    Review of Systems  Constitutional:  Constitutional negative. HENT: HENT negative.  Eyes: Eyes negative.  Respiratory: Respiratory negative.  Cardiovascular: Cardiovascular negative.  GI: Gastrointestinal negative.  Musculoskeletal: Musculoskeletal negative.  Skin: Skin negative.  Neurological: Neurological negative. Hematologic: Hematologic/lymphatic negative.  Psychiatric: Psychiatric negative.       Objective:  Objective  Vitals:   11/27/20 1117  BP: 132/72  Pulse: 68  Resp: 20  Temp: 98.4 F (36.9 C)  SpO2: 97%     Physical Exam HENT:     Head: Normocephalic.     Nose:     Comments: Wearing a mask Eyes:     Pupils: Pupils are equal, round, and reactive  to light.  Cardiovascular:     Rate and Rhythm: Normal rate.     Pulses:          Radial pulses are 0 on the right side and 2+ on the left side.       Popliteal pulses are 2+ on the right side and 2+ on the left side.       Dorsalis pedis pulses are 2+ on the right side and 2+ on the left side.       Posterior tibial pulses are 2+ on the right side and 2+ on the left side.  Pulmonary:     Effort: Pulmonary effort is normal.  Abdominal:     General: Abdomen is flat.     Palpations: Abdomen is soft.  Musculoskeletal:        General: Normal range of motion.     Right lower leg: No edema.     Left lower leg: No edema.  Skin:    General: Skin is warm.     Capillary Refill: Capillary refill takes less than 2 seconds.  Neurological:     General: No  focal deficit present.     Mental Status: He is alert.  Psychiatric:        Mood and Affect: Mood normal.        Behavior: Behavior normal.        Thought Content: Thought content normal.        Judgment: Judgment normal.    Data: US Aorta IMPRESSION: 2.1 cm aneurysm of the right common iliac artery by ultrasound. This measured roughly 1.7 cm by standard CT in 2007. Consider eventual correlation with CT angiography of the abdomen and pelvis and referral to vascular surgery.       Assessment/Plan:     70 year old male with 2.1 cm right common iliac artery aneurysm.  This is grown minimally since 2007.  I discussed with him the signs and symptoms of rupture as well as the expected course of this size aneurysm and the need for CT scan if it reaches greater than 3 cm.  We will follow him up in 1 year.  If at that time it has not changed we can go to every 2-year follow-up.  He demonstrates good understanding of our conversation today.  I recommended smoking cessation this does not appear that it will occur.     Waynetta Sandy MD [facility]

## 2020-11-28 ENCOUNTER — Ambulatory Visit
Admission: RE | Admit: 2020-11-28 | Discharge: 2020-11-28 | Disposition: A | Discharge status: Home / Self Care | Payer: Medicare Other | Source: Ambulatory Visit | Attending: Gastroenterology | Admitting: Gastroenterology

## 2020-11-28 DIAGNOSIS — K8020 Calculus of gallbladder without cholecystitis without obstruction: Secondary | ICD-10-CM | POA: Diagnosis not present

## 2020-11-28 DIAGNOSIS — I77811 Abdominal aortic ectasia: Secondary | ICD-10-CM | POA: Diagnosis not present

## 2020-11-28 DIAGNOSIS — N281 Cyst of kidney, acquired: Secondary | ICD-10-CM | POA: Diagnosis not present

## 2020-11-28 DIAGNOSIS — K838 Other specified diseases of biliary tract: Secondary | ICD-10-CM

## 2020-11-28 DIAGNOSIS — K7460 Unspecified cirrhosis of liver: Secondary | ICD-10-CM | POA: Diagnosis not present

## 2020-11-28 MED ORDER — GADOBENATE DIMEGLUMINE 529 MG/ML IV SOLN
15.0000 mL | Freq: Once | INTRAVENOUS | Status: AC | PRN
  Administered 2020-11-28: 15 mL via INTRAVENOUS

## 2020-11-30 ENCOUNTER — Telehealth: Payer: Self-pay

## 2020-11-30 NOTE — Telephone Encounter (Signed)
[facility] phoned with a call report but the pt MR ABD MRCP is already in the pt's chart so you can read it.

## 2020-11-30 NOTE — Telephone Encounter (Signed)
noted 

## 2020-12-02 ENCOUNTER — Telehealth: Payer: Self-pay | Admitting: Gastroenterology

## 2020-12-02 NOTE — Telephone Encounter (Signed)
Opened in error

## 2020-12-09 ENCOUNTER — Telehealth: Payer: Self-pay

## 2020-12-09 DIAGNOSIS — K7400 Hepatic fibrosis, unspecified: Secondary | ICD-10-CM

## 2020-12-09 DIAGNOSIS — K769 Liver disease, unspecified: Secondary | ICD-10-CM

## 2020-12-09 NOTE — Telephone Encounter (Signed)
Pt phoned inquiring of his results from procedure done 11/28/2020. (MR ABDOMEN MRCP). I  advised the pt that you were out of the office due to illness and he was ok with that.

## 2020-12-10 NOTE — Telephone Encounter (Signed)
Spoke with patient.  Lab orders entered.  See result note.

## 2020-12-10 NOTE — Addendum Note (Signed)
Addended by: Mahala Menghini on: 12/10/2020 01:10 PM   Modules accepted: Orders

## 2020-12-10 NOTE — Telephone Encounter (Signed)
noted 

## 2020-12-17 DIAGNOSIS — I10 Essential (primary) hypertension: Secondary | ICD-10-CM | POA: Diagnosis not present

## 2020-12-17 DIAGNOSIS — N1832 Chronic kidney disease, stage 3b: Secondary | ICD-10-CM | POA: Diagnosis not present

## 2020-12-28 DIAGNOSIS — G894 Chronic pain syndrome: Secondary | ICD-10-CM | POA: Diagnosis not present

## 2020-12-28 DIAGNOSIS — Z79891 Long term (current) use of opiate analgesic: Secondary | ICD-10-CM | POA: Diagnosis not present

## 2020-12-28 DIAGNOSIS — I10 Essential (primary) hypertension: Secondary | ICD-10-CM | POA: Diagnosis not present

## 2021-01-08 DIAGNOSIS — G8929 Other chronic pain: Secondary | ICD-10-CM | POA: Diagnosis not present

## 2021-01-08 DIAGNOSIS — F17210 Nicotine dependence, cigarettes, uncomplicated: Secondary | ICD-10-CM | POA: Diagnosis not present

## 2021-01-08 DIAGNOSIS — I714 Abdominal aortic aneurysm, without rupture: Secondary | ICD-10-CM | POA: Diagnosis not present

## 2021-01-08 DIAGNOSIS — I10 Essential (primary) hypertension: Secondary | ICD-10-CM | POA: Diagnosis not present

## 2021-01-08 DIAGNOSIS — N1832 Chronic kidney disease, stage 3b: Secondary | ICD-10-CM | POA: Diagnosis not present

## 2021-01-15 DIAGNOSIS — E559 Vitamin D deficiency, unspecified: Secondary | ICD-10-CM | POA: Diagnosis not present

## 2021-01-15 DIAGNOSIS — N1831 Chronic kidney disease, stage 3a: Secondary | ICD-10-CM | POA: Diagnosis not present

## 2021-01-15 DIAGNOSIS — I952 Hypotension due to drugs: Secondary | ICD-10-CM | POA: Diagnosis not present

## 2021-01-15 DIAGNOSIS — E875 Hyperkalemia: Secondary | ICD-10-CM | POA: Diagnosis not present

## 2021-01-15 LAB — CREATININE, SERUM: Creat: 1.57 mg/dL — ABNORMAL HIGH (ref 0.70–1.35)

## 2021-01-20 ENCOUNTER — Other Ambulatory Visit: Payer: Self-pay

## 2021-01-20 DIAGNOSIS — K7400 Hepatic fibrosis, unspecified: Secondary | ICD-10-CM

## 2021-01-20 DIAGNOSIS — B182 Chronic viral hepatitis C: Secondary | ICD-10-CM

## 2021-01-28 DIAGNOSIS — K7400 Hepatic fibrosis, unspecified: Secondary | ICD-10-CM | POA: Diagnosis not present

## 2021-01-29 DIAGNOSIS — N1831 Chronic kidney disease, stage 3a: Secondary | ICD-10-CM | POA: Diagnosis not present

## 2021-01-29 DIAGNOSIS — E875 Hyperkalemia: Secondary | ICD-10-CM | POA: Diagnosis not present

## 2021-01-29 DIAGNOSIS — E559 Vitamin D deficiency, unspecified: Secondary | ICD-10-CM | POA: Diagnosis not present

## 2021-01-29 DIAGNOSIS — D638 Anemia in other chronic diseases classified elsewhere: Secondary | ICD-10-CM | POA: Diagnosis not present

## 2021-01-29 DIAGNOSIS — I129 Hypertensive chronic kidney disease with stage 1 through stage 4 chronic kidney disease, or unspecified chronic kidney disease: Secondary | ICD-10-CM | POA: Diagnosis not present

## 2021-01-29 LAB — AFP TUMOR MARKER: AFP-Tumor Marker: 1.8 ng/mL (ref <6.1)

## 2021-01-29 LAB — PROTIME-INR
INR: 1
Prothrombin Time: 9.9 s (ref 9.0–11.5)

## 2021-02-05 ENCOUNTER — Other Ambulatory Visit: Payer: Self-pay

## 2021-02-05 DIAGNOSIS — I129 Hypertensive chronic kidney disease with stage 1 through stage 4 chronic kidney disease, or unspecified chronic kidney disease: Secondary | ICD-10-CM | POA: Diagnosis not present

## 2021-02-05 DIAGNOSIS — E875 Hyperkalemia: Secondary | ICD-10-CM | POA: Diagnosis not present

## 2021-02-05 DIAGNOSIS — K7689 Other specified diseases of liver: Secondary | ICD-10-CM

## 2021-02-05 DIAGNOSIS — N1831 Chronic kidney disease, stage 3a: Secondary | ICD-10-CM | POA: Diagnosis not present

## 2021-02-05 DIAGNOSIS — D638 Anemia in other chronic diseases classified elsewhere: Secondary | ICD-10-CM | POA: Diagnosis not present

## 2021-02-05 DIAGNOSIS — E559 Vitamin D deficiency, unspecified: Secondary | ICD-10-CM | POA: Diagnosis not present

## 2021-02-08 DIAGNOSIS — N1832 Chronic kidney disease, stage 3b: Secondary | ICD-10-CM | POA: Diagnosis not present

## 2021-02-08 DIAGNOSIS — I10 Essential (primary) hypertension: Secondary | ICD-10-CM | POA: Diagnosis not present

## 2021-03-12 ENCOUNTER — Other Ambulatory Visit: Payer: Self-pay | Admitting: Nurse Practitioner

## 2021-03-12 ENCOUNTER — Telehealth: Payer: Self-pay | Admitting: Gastroenterology

## 2021-03-12 DIAGNOSIS — D376 Neoplasm of uncertain behavior of liver, gallbladder and bile ducts: Secondary | ICD-10-CM

## 2021-03-12 DIAGNOSIS — K7400 Hepatic fibrosis, unspecified: Secondary | ICD-10-CM | POA: Diagnosis not present

## 2021-03-12 NOTE — Telephone Encounter (Signed)
Spoke to Roosevelt Locks, NP at [facility], see patient for liver lesion, referred by me.   Patient previously refused colonoscopy for positive cologuard but she has spoken to the patient regarding need to rule out colon cancer (liver lesion could be met vs HCC, too small to biopsy and repeat MRI pending). Patient states he refused before due to concern of being awake. He is agreeable if we can ensure he is asleep during entire exam.   Will discuss with Dr. Abbey Chatters about triage for colonoscopy.

## 2021-03-14 DIAGNOSIS — N1832 Chronic kidney disease, stage 3b: Secondary | ICD-10-CM | POA: Diagnosis not present

## 2021-03-14 DIAGNOSIS — I10 Essential (primary) hypertension: Secondary | ICD-10-CM | POA: Diagnosis not present

## 2021-03-15 NOTE — Telephone Encounter (Signed)
Dr. Abbey Chatters, can we triage for colonoscopy (h/o +cologuard). Patient last seen in 09/2020 but [facility] needing a colonoscopy asap to determine work up of small liver lesion (?met vs hepatoma).

## 2021-03-17 NOTE — Telephone Encounter (Signed)
Yes okay to triage, no office visit needed

## 2021-03-18 NOTE — Telephone Encounter (Signed)
Please schedule Colonoscopy with Dr. Abbey Chatters with propofol. ASA III. DX: h/o + cologuard

## 2021-03-19 ENCOUNTER — Telehealth: Payer: Self-pay | Admitting: Internal Medicine

## 2021-03-19 NOTE — Telephone Encounter (Signed)
Pt called to say that we scheduled his MRI for 10/29 and he is very claustrophobic and that Dr Oneida Alar in the past would call in a couple of valium for him. I told him Dr Oneida Alar is no longer at this office and I would have to get with the nurse to find out what can be recommended. Please advise. [phone]

## 2021-03-19 NOTE — Telephone Encounter (Signed)
Scheduled nurse visit by phone for 03/22/2021 at 10:00.  Pt made aware.

## 2021-03-19 NOTE — Telephone Encounter (Signed)
Routing to Angie to triage colonoscopy.

## 2021-03-19 NOTE — Telephone Encounter (Signed)
Patient last seen you on 09-22-20. Please advise. Thanks.

## 2021-03-22 ENCOUNTER — Other Ambulatory Visit: Payer: Self-pay

## 2021-03-22 ENCOUNTER — Ambulatory Visit: Payer: Self-pay

## 2021-03-22 MED ORDER — DIAZEPAM 10 MG PO TABS: 10.0000 mg | ORAL_TABLET | Freq: Once | ORAL | 0 refills | Status: AC

## 2021-03-22 NOTE — Telephone Encounter (Signed)
Spoke to pt.  He rescheduled his ov to 04/01/2021 at 11:00 by telephone.  He was busy today and couldn't do phone visit.

## 2021-03-22 NOTE — Telephone Encounter (Signed)
I didn't order the MRI, but I have given him Valium prior to his last MRI to help with his claustrophobia. Will send in one valium to take 30 minutes before his MRI but he should not drive.

## 2021-03-22 NOTE — Addendum Note (Signed)
Addended by: Mahala Menghini on: 03/22/2021 06:48 PM   Modules accepted: Orders

## 2021-03-23 NOTE — Telephone Encounter (Signed)
Pt was made aware and verbalized understanding.  

## 2021-03-29 DIAGNOSIS — G894 Chronic pain syndrome: Secondary | ICD-10-CM | POA: Diagnosis not present

## 2021-03-29 DIAGNOSIS — Z79891 Long term (current) use of opiate analgesic: Secondary | ICD-10-CM | POA: Diagnosis not present

## 2021-03-29 DIAGNOSIS — I10 Essential (primary) hypertension: Secondary | ICD-10-CM | POA: Diagnosis not present

## 2021-04-01 ENCOUNTER — Ambulatory Visit (INDEPENDENT_AMBULATORY_CARE_PROVIDER_SITE_OTHER): Payer: Self-pay | Admitting: *Deleted

## 2021-04-01 ENCOUNTER — Encounter: Payer: Self-pay | Admitting: *Deleted

## 2021-04-01 VITALS — Ht 70.0 in | Wt 165.0 lb

## 2021-04-01 DIAGNOSIS — R195 Other fecal abnormalities: Secondary | ICD-10-CM

## 2021-04-01 MED ORDER — PEG 3350-KCL-NA BICARB-NACL 420 G PO SOLR: 4000.0000 mL | Freq: Once | ORAL | 0 refills | Status: AC

## 2021-04-01 NOTE — Progress Notes (Addendum)
Gastroenterology Pre-Procedure Review  Request Date:  04/01/2021 Requesting Physician: triage per Dr. Abbey Chatters (see telephone note 03/12/2021), history of positive cologuard, Last TCS done over 30 years ago per pt, he was unsure of findings  PATIENT REVIEW QUESTIONS: The patient responded to the following health history questions as indicated:    1. Diabetes Melitis: no 2. Joint replacements in the past 12 months: no 3. Major health problems in the past 3 months: no 4. Has an artificial valve or MVP: no 5. Has a defibrillator: no 6. Has been advised in past to take antibiotics in advance of a procedure like teeth cleaning: no 7. Family history of colon cancer: no  8. Alcohol Use: no 9. Illicit drug Use: no 10. History of sleep apnea: no  11. History of coronary artery or other vascular stents placed within the last 12 months: no 12. History of any prior anesthesia complications: no 13. Body mass index is 23.68 kg/m.    MEDICATIONS & ALLERGIES:    Patient reports the following regarding taking any blood thinners:   Plavix? no Aspirin? no Coumadin? no Brilinta? no Xarelto? no Eliquis? no Pradaxa? no Savaysa? no Effient? no  Patient confirms/reports the following medications:  Current Outpatient Medications  Medication Sig Dispense Refill   buPROPion (WELLBUTRIN XL) 300 MG 24 hr tablet Take 1 tablet by mouth every morning.     cloNIDine (CATAPRES) 0.1 MG tablet Take 0.1 mg by mouth at bedtime.     furosemide (LASIX) 20 MG tablet Take 20 mg by mouth daily. Takes 1/2 pill twice daily.  20 mg total daily.     gabapentin (NEURONTIN) 600 MG tablet Take 600 mg by mouth 3 (three) times daily.     lisinopril (ZESTRIL) 5 MG tablet Take 5 mg by mouth daily.     SUBOXONE 8-2 MG FILM Take 1.5 Film by mouth daily.     No current facility-administered medications for this visit.    Patient confirms/reports the following allergies:  No Known Allergies  No orders of the defined types were  placed in this encounter.   AUTHORIZATION INFORMATION Primary Insurance: [facility] Medicare,  ID #: [id number],  Group #: [id number] Pre-Cert / Auth required: Yes, approved online [id number] Pre-Cert / Josem Kaufmann #: [id number]  SCHEDULE INFORMATION: Procedure has been scheduled as follows:  Date: 05/03/2021, Time: 7:30  Location: [facility] with Dr. Abbey Chatters  This Gastroenterology Pre-Precedure Review Form is being routed to the following provider(s): Roseanne Kaufman, NP

## 2021-04-01 NOTE — Progress Notes (Signed)
Appropriate. ASA 2.  

## 2021-04-01 NOTE — Progress Notes (Signed)
Pt requested to have procedure done at the end of Nov due to his busy schedule.  Informed pt that he would require a Pre-op visit before procedure.  Pt aware that I will inform him of date and time and mail out letter.

## 2021-04-02 ENCOUNTER — Other Ambulatory Visit: Payer: Self-pay | Admitting: *Deleted

## 2021-04-02 ENCOUNTER — Encounter: Payer: Self-pay | Admitting: *Deleted

## 2021-04-02 NOTE — Progress Notes (Signed)
Spoke to pt.  He was made aware of his Pre-op appt on 04/27/2021 at 9:00 at [facility].  He was informed that I am mailing out letter.

## 2021-04-02 NOTE — Progress Notes (Signed)
Pt was given rating of ASA III originally by Neil Crouch, PA-C (per phone note 03/12/2021).  Spoke with Roseanne Kaufman, NP and ok to keep pt as scheduled on ASA III day.

## 2021-04-03 ENCOUNTER — Ambulatory Visit
Admission: RE | Admit: 2021-04-03 | Discharge: 2021-04-03 | Disposition: A | Discharge status: Home / Self Care | Payer: Medicare Other | Source: Ambulatory Visit | Attending: Nurse Practitioner | Admitting: Nurse Practitioner

## 2021-04-03 ENCOUNTER — Other Ambulatory Visit: Payer: Self-pay

## 2021-04-03 DIAGNOSIS — D376 Neoplasm of uncertain behavior of liver, gallbladder and bile ducts: Secondary | ICD-10-CM

## 2021-04-12 ENCOUNTER — Telehealth: Payer: Self-pay

## 2021-04-12 MED ORDER — DIAZEPAM 10 MG PO TABS: 10.0000 mg | ORAL_TABLET | Freq: Once | ORAL | 0 refills | Status: DC | PRN

## 2021-04-12 NOTE — Telephone Encounter (Signed)
I had actually asked him that he said the morning of the procedure when he had got there the procedure was cancelled and rescheduled. I asked him why he said because they didn't have a open unit for him. I asked him did he get a phone call a day before as a reminder of his appt to confirm and he said no. He also advised me that he didn't see what the problem was because Dr. Oneida Alar would always send in the Rx for him. I advised him she no longer works here and the doctors here have their own protocol. Is this pt normally slurred in speech and hard to understand because both times I spoke to him regarding this Valium he sounded this way.

## 2021-04-12 NOTE — Telephone Encounter (Signed)
Pt phoned advising he needed a Valium to take before his MRI on 04/24/2021. Please send in to his pharmacy

## 2021-04-12 NOTE — Telephone Encounter (Signed)
noted 

## 2021-04-12 NOTE — Addendum Note (Signed)
Addended by: Mahala Menghini on: 04/12/2021 01:10 PM   Modules accepted: Orders

## 2021-04-12 NOTE — Telephone Encounter (Signed)
Confirmed that MR was rescheduled as stated by patient. Sent in RX for one Valium. I called to speak with patient. LMOAM. Informed him RX has been sent.

## 2021-04-12 NOTE — Telephone Encounter (Signed)
This was addressed last month, see telephone note from 03/19/21. I sent in Valium to [place] 03/22/21.   Did he pick it up?

## 2021-04-14 DIAGNOSIS — N1832 Chronic kidney disease, stage 3b: Secondary | ICD-10-CM | POA: Diagnosis not present

## 2021-04-14 DIAGNOSIS — I10 Essential (primary) hypertension: Secondary | ICD-10-CM | POA: Diagnosis not present

## 2021-04-24 ENCOUNTER — Ambulatory Visit
Admission: RE | Admit: 2021-04-24 | Discharge: 2021-04-24 | Disposition: A | Discharge status: Home / Self Care | Payer: Medicare Other | Source: Ambulatory Visit | Attending: Nurse Practitioner | Admitting: Nurse Practitioner

## 2021-04-24 ENCOUNTER — Other Ambulatory Visit: Payer: Self-pay

## 2021-04-24 DIAGNOSIS — I700 Atherosclerosis of aorta: Secondary | ICD-10-CM | POA: Diagnosis not present

## 2021-04-24 DIAGNOSIS — K8020 Calculus of gallbladder without cholecystitis without obstruction: Secondary | ICD-10-CM | POA: Diagnosis not present

## 2021-04-24 DIAGNOSIS — K769 Liver disease, unspecified: Secondary | ICD-10-CM | POA: Diagnosis not present

## 2021-04-24 MED ORDER — GADOBENATE DIMEGLUMINE 529 MG/ML IV SOLN
15.0000 mL | Freq: Once | INTRAVENOUS | Status: AC | PRN
  Administered 2021-04-24: 15 mL via INTRAVENOUS

## 2021-04-26 NOTE — Patient Instructions (Signed)
   Your procedure is scheduled on: 05/03/2021  Report to [facility] at   6:15  AM.  Call this number if you have problems the morning of surgery: [phone]   Remember:              Follow Directions on the letter you received from Your Physician's office regarding the Bowel Prep              No Smoking the day of Procedure :   Take these medicines the morning of surgery with A SIP OF WATER: Wellbutrin, gabapentin, and suboxone if needed   Do not wear jewelry, make-up or nail polish.    Do not bring valuables to the hospital.  Contacts, dentures or bridgework may not be worn into surgery.  .   Patients discharged the day of surgery will not be allowed to drive home.     Colonoscopy, Adult, Care After This sheet gives you information about how to care for yourself after your procedure. Your health care provider may also give you more specific instructions. If you have problems or questions, contact your health care provider. What can I expect after the procedure? After the procedure, it is common to have: A small amount of blood in your stool for 24 hours after the procedure. Some gas. Mild abdominal cramping or bloating.  Follow these instructions at home: General instructions  For the first 24 hours after the procedure: Do not drive or use machinery. Do not sign important documents. Do not drink alcohol. Do your regular daily activities at a slower pace than normal. Eat soft, easy-to-digest foods. Rest often. Take over-the-counter or prescription medicines only as told by your health care provider. It is up to you to get the results of your procedure. Ask your health care provider, or the department performing the procedure, when your results will be ready. Relieving cramping and bloating Try walking around when you have cramps or feel bloated. Apply heat to your abdomen as told by your health care provider. Use a heat source that your health care provider  recommends, such as a moist heat pack or a heating pad. Place a towel between your skin and the heat source. Leave the heat on for 20-30 minutes. Remove the heat if your skin turns bright red. This is especially important if you are unable to feel pain, heat, or cold. You may have a greater risk of getting burned. Eating and drinking Drink enough fluid to keep your urine clear or pale yellow. Resume your normal diet as instructed by your health care provider. Avoid heavy or fried foods that are hard to digest. Avoid drinking alcohol for as long as instructed by your health care provider. Contact a health care provider if: You have blood in your stool 2-3 days after the procedure. Get help right away if: You have more than a small spotting of blood in your stool. You pass large blood clots in your stool. Your abdomen is swollen. You have nausea or vomiting. You have a fever. You have increasing abdominal pain that is not relieved with medicine. This information is not intended to replace advice given to you by your health care provider. Make sure you discuss any questions you have with your health care provider. Document Released: 01/05/2004 Document Revised: 02/15/2016 Document Reviewed: 08/04/2015 Elsevier Interactive Patient Education  [employer].

## 2021-04-27 ENCOUNTER — Other Ambulatory Visit: Payer: Self-pay | Admitting: Nurse Practitioner

## 2021-04-27 ENCOUNTER — Other Ambulatory Visit: Payer: Self-pay

## 2021-04-27 ENCOUNTER — Encounter (HOSPITAL_COMMUNITY): Payer: Self-pay

## 2021-04-27 ENCOUNTER — Encounter (HOSPITAL_COMMUNITY)
Admission: RE | Admit: 2021-04-27 | Discharge: 2021-04-27 | Disposition: A | Discharge status: Home / Self Care | Payer: Medicare Other | Source: Ambulatory Visit | Attending: Internal Medicine | Admitting: Internal Medicine

## 2021-04-27 VITALS — BP 143/61 | HR 68 | Temp 97.7°F | Resp 18 | Ht 70.0 in | Wt 165.0 lb

## 2021-04-27 DIAGNOSIS — Z01818 Encounter for other preprocedural examination: Secondary | ICD-10-CM | POA: Diagnosis not present

## 2021-04-27 DIAGNOSIS — B182 Chronic viral hepatitis C: Secondary | ICD-10-CM | POA: Diagnosis not present

## 2021-04-27 DIAGNOSIS — Z79899 Other long term (current) drug therapy: Secondary | ICD-10-CM

## 2021-04-27 DIAGNOSIS — K769 Liver disease, unspecified: Secondary | ICD-10-CM

## 2021-04-27 LAB — CBC WITH DIFFERENTIAL/PLATELET
Abs Immature Granulocytes: 0.03 10*3/uL (ref 0.00–0.07)
Basophils Absolute: 0.1 10*3/uL (ref 0.0–0.1)
Basophils Relative: 1 %
Eosinophils Absolute: 0.3 10*3/uL (ref 0.0–0.5)
Eosinophils Relative: 4 %
HCT: 41.3 % (ref 39.0–52.0)
Hemoglobin: 13.5 g/dL (ref 13.0–17.0)
Immature Granulocytes: 0 %
Lymphocytes Relative: 16 %
Lymphs Abs: 1.4 10*3/uL (ref 0.7–4.0)
MCH: 32.3 pg (ref 26.0–34.0)
MCHC: 32.7 g/dL (ref 30.0–36.0)
MCV: 98.8 fL (ref 80.0–100.0)
Monocytes Absolute: 0.6 10*3/uL (ref 0.1–1.0)
Monocytes Relative: 8 %
Neutro Abs: 5.9 10*3/uL (ref 1.7–7.7)
Neutrophils Relative %: 71 %
Platelets: 276 10*3/uL (ref 150–400)
RBC: 4.18 MIL/uL — ABNORMAL LOW (ref 4.22–5.81)
RDW: 13.2 % (ref 11.5–15.5)
WBC: 8.3 10*3/uL (ref 4.0–10.5)
nRBC: 0 % (ref 0.0–0.2)

## 2021-04-27 LAB — COMPREHENSIVE METABOLIC PANEL
ALT: 15 U/L (ref 0–44)
AST: 16 U/L (ref 15–41)
Albumin: 4.4 g/dL (ref 3.5–5.0)
Alkaline Phosphatase: 71 U/L (ref 38–126)
Anion gap: 8 (ref 5–15)
BUN: 25 mg/dL — ABNORMAL HIGH (ref 8–23)
CO2: 25 mmol/L (ref 22–32)
Calcium: 9.2 mg/dL (ref 8.9–10.3)
Chloride: 104 mmol/L (ref 98–111)
Creatinine, Ser: 1.52 mg/dL — ABNORMAL HIGH (ref 0.61–1.24)
GFR, Estimated: 50 mL/min — ABNORMAL LOW (ref >60)
Glucose, Bld: 115 mg/dL — ABNORMAL HIGH (ref 70–99)
Potassium: 5 mmol/L (ref 3.5–5.1)
Sodium: 137 mmol/L (ref 135–145)
Total Bilirubin: 0.4 mg/dL (ref 0.3–1.2)
Total Protein: 7.2 g/dL (ref 6.5–8.1)

## 2021-04-27 LAB — PROTIME-INR
INR: 0.9 (ref 0.8–1.2)
Prothrombin Time: 12.5 seconds (ref 11.4–15.2)

## 2021-05-03 ENCOUNTER — Encounter (HOSPITAL_COMMUNITY)
Admission: RE | Disposition: A | Discharge status: Home / Self Care | Payer: Self-pay | Source: Home / Self Care | Attending: Internal Medicine

## 2021-05-03 ENCOUNTER — Ambulatory Visit (HOSPITAL_COMMUNITY): Payer: Medicare Other | Admitting: Certified Registered Nurse Anesthetist

## 2021-05-03 ENCOUNTER — Ambulatory Visit (HOSPITAL_COMMUNITY)
Admission: RE | Admit: 2021-05-03 | Discharge: 2021-05-03 | Disposition: A | Discharge status: Home / Self Care | Payer: Medicare Other | Attending: Internal Medicine | Admitting: Internal Medicine

## 2021-05-03 ENCOUNTER — Encounter (HOSPITAL_COMMUNITY): Payer: Self-pay

## 2021-05-03 DIAGNOSIS — K5730 Diverticulosis of large intestine without perforation or abscess without bleeding: Secondary | ICD-10-CM | POA: Diagnosis not present

## 2021-05-03 DIAGNOSIS — R195 Other fecal abnormalities: Secondary | ICD-10-CM | POA: Diagnosis not present

## 2021-05-03 DIAGNOSIS — D122 Benign neoplasm of ascending colon: Secondary | ICD-10-CM | POA: Insufficient documentation

## 2021-05-03 DIAGNOSIS — D124 Benign neoplasm of descending colon: Secondary | ICD-10-CM | POA: Diagnosis not present

## 2021-05-03 DIAGNOSIS — D126 Benign neoplasm of colon, unspecified: Secondary | ICD-10-CM | POA: Diagnosis not present

## 2021-05-03 DIAGNOSIS — F17210 Nicotine dependence, cigarettes, uncomplicated: Secondary | ICD-10-CM | POA: Diagnosis not present

## 2021-05-03 DIAGNOSIS — K635 Polyp of colon: Secondary | ICD-10-CM | POA: Diagnosis not present

## 2021-05-03 HISTORY — PX: POLYPECTOMY: SHX5525

## 2021-05-03 HISTORY — PX: COLONOSCOPY WITH PROPOFOL: SHX5780

## 2021-05-03 SURGERY — COLONOSCOPY WITH PROPOFOL
Anesthesia: General

## 2021-05-03 MED ORDER — PHENYLEPHRINE HCL (PRESSORS) 10 MG/ML IV SOLN
INTRAVENOUS | Status: DC | PRN
  Administered 2021-05-03 (×3): 80 ug via INTRAVENOUS

## 2021-05-03 MED ORDER — LACTATED RINGERS IV SOLN: INTRAVENOUS | Status: DC

## 2021-05-03 MED ORDER — PROPOFOL 10 MG/ML IV BOLUS
INTRAVENOUS | Status: DC | PRN
  Administered 2021-05-03 (×3): 50 mg via INTRAVENOUS
  Administered 2021-05-03: 100 mg via INTRAVENOUS

## 2021-05-03 NOTE — Op Note (Signed)
[facility] Patient Name: Steven [name] Procedure Date: 05/03/2021 7:16 AM MRN: [id number] Date of Birth: 04/14/1951 Attending MD: Elon Alas. Abbey Chatters DO CSN: [id number] Age: 70 Admit Type: Outpatient Procedure:                Colonoscopy Indications:              Positive Cologuard test Providers:                Elon Alas. Abbey Chatters, DO, Lurline Del, RN, Randa Spike, Technician Referring MD:              Medicines:                See the Anesthesia note for documentation of the                            administered medications Complications:            No immediate complications. Estimated Blood Loss:     Estimated blood loss was minimal. Procedure:                Pre-Anesthesia Assessment:                           - The anesthesia plan was to use monitored                            anesthesia care (MAC).                           After obtaining informed consent, the colonoscope                            was passed under direct vision. Throughout the                            procedure, the patient's blood pressure, pulse, and                            oxygen saturations were monitored continuously. The                            [id number]-HQ190L ([id number]) scope was introduced through                            the anus and advanced to the the cecum, identified                            by appendiceal orifice and ileocecal valve. The                            colonoscopy was performed without difficulty. The                            patient tolerated the procedure well. The quality  of the bowel preparation was evaluated using the                            BBPS [facility] Bowel Preparation Scale) with scores                            of: Right Colon = 2 (minor amount of residual                            staining, small fragments of stool and/or opaque                            liquid, but mucosa seen well),  Transverse Colon = 2                            (minor amount of residual staining, small fragments                            of stool and/or opaque liquid, but mucosa seen                            well) and Left Colon = 2 (minor amount of residual                            staining, small fragments of stool and/or opaque                            liquid, but mucosa seen well). The total BBPS score                            equals 6. The quality of the bowel preparation was                            fair. Scope In: 7:49:26 AM Scope Out: 8:08:38 AM Scope Withdrawal Time: 0 hours 13 minutes 54 seconds  Total Procedure Duration: 0 hours 19 minutes 12 seconds  Findings:      The perianal and digital rectal examinations were normal.      Multiple small-mouthed diverticula were found in the sigmoid colon.      A 5 mm polyp was found in the ascending colon. The polyp was flat. The       polyp was removed with a cold snare. Resection and retrieval were       complete.      Two sessile polyps were found in the descending colon. The polyps were 4       to 6 mm in size. These polyps were removed with a cold snare. Resection       and retrieval were complete.      The exam was otherwise without abnormality. Impression:               - Preparation of the colon was fair.                           - Diverticulosis in the  sigmoid colon.                           - One 5 mm polyp in the ascending colon, removed                            with a cold snare. Resected and retrieved.                           - Two 4 to 6 mm polyps in the descending colon,                            removed with a cold snare. Resected and retrieved.                           - The examination was otherwise normal. Moderate Sedation:      Per Anesthesia Care Recommendation:           - Patient has a contact number available for                            emergencies. The signs and symptoms of potential                             delayed complications were discussed with the                            patient. Return to normal activities tomorrow.                            Written discharge instructions were provided to the                            patient.                           - Resume previous diet.                           - Continue present medications.                           - Await pathology results.                           - Repeat colonoscopy in 3 - 5 years for                            surveillance.                           - Return to GI clinic PRN. Procedure Code(s):        --- Professional ---                           [phone], Colonoscopy, flexible; with removal of  tumor(s), polyp(s), or other lesion(s) by snare                            technique Diagnosis Code(s):        --- Professional ---                           K63.5, Polyp of colon                           R19.5, Other fecal abnormalities                           K57.30, Diverticulosis of large intestine without                            perforation or abscess without bleeding CPT copyright 2019 American Medical Association. All rights reserved. The codes documented in this report are preliminary and upon coder review may  be revised to meet current compliance requirements. Elon Alas. Abbey Chatters, DO [place] Abbey Chatters, DO 05/03/2021 8:15:32 AM This report has been signed electronically. Number of Addenda: 0

## 2021-05-03 NOTE — Anesthesia Preprocedure Evaluation (Signed)
Anesthesia Evaluation  Patient identified by MRN, date of birth, ID band Patient awake    Reviewed: Allergy & Precautions, H&P , NPO status , Patient's Chart, lab work & pertinent test results, reviewed documented beta blocker date and time   Airway Mallampati: II  TM Distance: >3 FB Neck ROM: full    Dental no notable dental hx.    Pulmonary neg pulmonary ROS, Current Smoker,    Pulmonary exam normal breath sounds clear to auscultation       Cardiovascular Exercise Tolerance: Good hypertension, negative cardio ROS   Rhythm:regular Rate:Normal     Neuro/Psych negative neurological ROS  negative psych ROS   GI/Hepatic negative GI ROS, Neg liver ROS,   Endo/Other  negative endocrine ROS  Renal/GU negative Renal ROS  negative genitourinary   Musculoskeletal   Abdominal   Peds  Hematology negative hematology ROS (+)   Anesthesia Other Findings   Reproductive/Obstetrics negative OB ROS                             Anesthesia Physical Anesthesia Plan  ASA: 2  Anesthesia Plan: General   Post-op Pain Management:    Induction:   PONV Risk Score and Plan: Propofol infusion  Airway Management Planned:   Additional Equipment:   Intra-op Plan:   Post-operative Plan:   Informed Consent: I have reviewed the patients History and Physical, chart, labs and discussed the procedure including the risks, benefits and alternatives for the proposed anesthesia with the patient or authorized representative who has indicated his/her understanding and acceptance.     Dental Advisory Given  Plan Discussed with: CRNA  Anesthesia Plan Comments:         Anesthesia Quick Evaluation  

## 2021-05-03 NOTE — Discharge Instructions (Signed)
  Colonoscopy Discharge Instructions  Read the instructions outlined below and refer to this sheet in the next few weeks. These discharge instructions provide you with general information on caring for yourself after you leave the hospital. Your doctor may also give you specific instructions. While your treatment has been planned according to the most current medical practices available, unavoidable complications occasionally occur.   ACTIVITY You may resume your regular activity, but move at a slower pace for the next 24 hours.  Take frequent rest periods for the next 24 hours.  Walking will help get rid of the air and reduce the bloated feeling in your belly (abdomen).  No driving for 24 hours (because of the medicine (anesthesia) used during the test).   Do not sign any important legal documents or operate any machinery for 24 hours (because of the anesthesia used during the test).  NUTRITION Drink plenty of fluids.  You may resume your normal diet as instructed by your doctor.  Begin with a light meal and progress to your normal diet. Heavy or fried foods are harder to digest and may make you feel sick to your stomach (nauseated).  Avoid alcoholic beverages for 24 hours or as instructed.  MEDICATIONS You may resume your normal medications unless your doctor tells you otherwise.  WHAT YOU CAN EXPECT TODAY Some feelings of bloating in the abdomen.  Passage of more gas than usual.  Spotting of blood in your stool or on the toilet paper.  IF YOU HAD POLYPS REMOVED DURING THE COLONOSCOPY: No aspirin products for 7 days or as instructed.  No alcohol for 7 days or as instructed.  Eat a soft diet for the next 24 hours.  FINDING OUT THE RESULTS OF YOUR TEST Not all test results are available during your visit. If your test results are not back during the visit, make an appointment with your caregiver to find out the results. Do not assume everything is normal if you have not heard from your  caregiver or the medical facility. It is important for you to follow up on all of your test results.  SEEK IMMEDIATE MEDICAL ATTENTION IF: You have more than a spotting of blood in your stool.  Your belly is swollen (abdominal distention).  You are nauseated or vomiting.  You have a temperature over 101.  You have abdominal pain or discomfort that is severe or gets worse throughout the day.   Your colonoscopy revealed 3 polyp(s) which I removed successfully. Await pathology results, my office will contact you. I recommend repeating colonoscopy in 3-5 years for surveillance purposes. You also have diverticulosis and internal hemorrhoids. I would recommend increasing fiber in your diet or adding OTC Benefiber/Metamucil. Be sure to drink at least 4 to 6 glasses of water daily. Follow-up with GI as needed.    I hope you have a great rest of your week!  Elon Alas. Abbey Chatters, D.O. Gastroenterology and Hepatology [facility]

## 2021-05-03 NOTE — H&P (Signed)
Primary Care Physician:  Rosita Fire, MD Primary Gastroenterologist:  Dr. Abbey Chatters  Pre-Procedure History & Physical: HPI:  Steven [name] is a 70 y.o. male is here for a colonoscopy to be performed for positive cologuard testing  Past Medical History:  Diagnosis Date   Cancer ([place])    skin   EtOH dependence ([place])    Hepatitis C    successfully eradicated with Harvoni in 2018 with SVR documented   Hypertension    Shoulder impingement 2004    Past Surgical History:  Procedure Laterality Date   FRACTURE SURGERY Left    wrist   SHOULDER ACROMIOPLASTY Right 11/20/2012   Procedure: SHOULDER ACROMIOPLASTY;  Surgeon: Sanjuana Kava, MD;  Location: [facility];  Service: Orthopedics;  Laterality: Right;   SKIN CANCER EXCISION Left 2013   wrist surgery fell off a roof   STOMACH SURGERY     had bleeding ulcers repaired. age 62   TOTAL KNEE ARTHROPLASTY Right 11/04/2015   Procedure: RIGHT TOTAL KNEE ARTHROPLASTY;  Surgeon: Carole Civil, MD;  Location: [facility];  Service: Orthopedics;  Laterality: Right;    Prior to Admission medications   Medication Sig Start Date End Date Taking? Authorizing Provider  aspirin-acetaminophen-caffeine (EXCEDRIN MIGRAINE) [phone] MG tablet Take 1 tablet by mouth every 6 (six) hours as needed for headache or migraine.   Yes [provider]  buPROPion (WELLBUTRIN XL) 300 MG 24 hr tablet Take 1 tablet by mouth every morning. 09/18/20  Yes [provider]  cloNIDine (CATAPRES) 0.1 MG tablet Take 0.1 mg by mouth at bedtime. 07/31/16  Yes [provider]  furosemide (LASIX) 20 MG tablet Take 10 mg by mouth 2 (two) times daily. 08/04/20  Yes [provider]  gabapentin (NEURONTIN) 600 MG tablet Take 600 mg by mouth 3 (three) times daily. 09/18/20  Yes [provider]  lisinopril (ZESTRIL) 5 MG tablet Take 5 mg by mouth daily. 08/04/20  Yes [provider]  SUBOXONE 8-2 MG FILM Take 1 Film by mouth daily. 07/25/16   Yes [provider]  triamcinolone cream (KENALOG) 0.1 % Apply 1 application topically daily as needed (rash/itching).   Yes [provider]  VELTASSA 8.4 g packet Take 8.4 g by mouth 2 (two) times a week. 03/29/21  Yes [provider]  diazepam (VALIUM) 10 MG tablet Take 1 tablet (10 mg total) by mouth once as needed for up to 1 dose for anxiety (take 30-60 minutes before scheduled MRI. Do not drive.). 04/12/21   Mahala Menghini, PA-C    Allergies as of 04/02/2021   (No Known Allergies)    Family History  Problem Relation Age of Onset   Liver disease Neg Hx    Colon cancer Neg Hx     Social History   Socioeconomic History   Marital status: Divorced    Spouse name: Not on file   Number of children: Not on file   Years of education: Not on file   Highest education level: Not on file  Occupational History   Not on file  Tobacco Use   Smoking status: Every Day    Packs/day: 1.00    Years: 30.00    Pack years: 30.00    Types: Cigars, Cigarettes   Smokeless tobacco: Never  Vaping Use   Vaping Use: Never used  Substance and Sexual Activity   Alcohol use: Not Currently    Comment: 09/22/20 stopped drinking beer   Drug use: No    Frequency: 7.0  times per week    Types: Marijuana    Comment: says no  drugs 02/04/2014   Sexual activity: Yes  Other Topics Concern   Not on file  Social History Narrative   Not on file   Social Determinants of Health   Financial Resource Strain: Not on file  Food Insecurity: Not on file  Transportation Needs: Not on file  Physical Activity: Not on file  Stress: Not on file  Social Connections: Not on file  Intimate Partner Violence: Not on file    Review of Systems: See HPI, otherwise negative ROS  Physical Exam: Vital signs in last 24 hours: Temp:  [97.7 F (36.5 C)] 97.7 F (36.5 C) (11/28 0710) Pulse Rate:  [89] 89 (11/28 0705) Resp:  [18] 18 (11/28 0705) BP: (145)/(62) 145/62 (11/28 0705) SpO2:  [99  %] 99 % (11/28 0710)   General:   Alert,  Well-developed, well-nourished, pleasant and cooperative in NAD Head:  Normocephalic and atraumatic. Eyes:  Sclera clear, no icterus.   Conjunctiva pink. Ears:  Normal auditory acuity. Nose:  No deformity, discharge,  or lesions. Mouth:  No deformity or lesions, dentition normal. Neck:  Supple; no masses or thyromegaly. Lungs:  Clear throughout to auscultation.   No wheezes, crackles, or rhonchi. No acute distress. Heart:  Regular rate and rhythm; no murmurs, clicks, rubs,  or gallops. Abdomen:  Soft, nontender and nondistended. No masses, hepatosplenomegaly or hernias noted. Normal bowel sounds, without guarding, and without rebound.   Msk:  Symmetrical without gross deformities. Normal posture. Extremities:  Without clubbing or edema. Neurologic:  Alert and  oriented x4;  grossly normal neurologically. Skin:  Intact without significant lesions or rashes. Cervical Nodes:  No significant cervical adenopathy. Psych:  Alert and cooperative. Normal mood and affect.  Impression/Plan: Steven [name] is here for a colonoscopy to be performed for positive cologuard testing  The risks of the procedure including infection, bleed, or perforation as well as benefits, limitations, alternatives and imponderables have been reviewed with the patient. Questions have been answered. All parties agreeable.

## 2021-05-03 NOTE — Anesthesia Postprocedure Evaluation (Signed)
Anesthesia Post Note  Patient: Steven [name]  Procedure(s) Performed: COLONOSCOPY WITH PROPOFOL POLYPECTOMY  Patient location during evaluation: Phase II Anesthesia Type: General Level of consciousness: awake Pain management: pain level controlled Vital Signs Assessment: post-procedure vital signs reviewed and stable Respiratory status: spontaneous breathing and respiratory function stable Cardiovascular status: blood pressure returned to baseline and stable Postop Assessment: no headache and no apparent nausea or vomiting Anesthetic complications: no Comments: Late entry   No notable events documented.   Last Vitals:  Vitals:   05/03/21 0710 05/03/21 0814  BP:  (!) 101/54  Pulse:  70  Resp:  18  Temp: 36.5 C 36.7 C  SpO2: 99% 100%    Last Pain:  Vitals:   05/03/21 0814  TempSrc: Axillary  PainSc: 0-No pain                 Louann Sjogren

## 2021-05-03 NOTE — Transfer of Care (Signed)
Immediate Anesthesia Transfer of Care Note  Patient: Steven [name]  Procedure(s) Performed: COLONOSCOPY WITH PROPOFOL POLYPECTOMY  Patient Location: Short Stay  Anesthesia Type:General  Level of Consciousness: awake and alert   Airway & Oxygen Therapy: Patient Spontanous Breathing  Post-op Assessment: Report given to RN and Post -op Vital signs reviewed and stable  Post vital signs: Reviewed and stable  Last Vitals:  Vitals Value Taken Time  BP 101/54 05/03/21 0814  Temp 36.7 C 05/03/21 0814  Pulse 70 05/03/21 0814  Resp 18 05/03/21 0814  SpO2 100 % 05/03/21 0814    Last Pain:  Vitals:   05/03/21 0814  TempSrc: Axillary  PainSc: 0-No pain      Patients Stated Pain Goal: 7 (66/59/93 5701)  Complications: No notable events documented.

## 2021-05-04 LAB — SURGICAL PATHOLOGY

## 2021-05-05 ENCOUNTER — Encounter (HOSPITAL_COMMUNITY): Payer: Self-pay | Admitting: Internal Medicine

## 2021-05-14 DIAGNOSIS — I10 Essential (primary) hypertension: Secondary | ICD-10-CM | POA: Diagnosis not present

## 2021-05-14 DIAGNOSIS — N1832 Chronic kidney disease, stage 3b: Secondary | ICD-10-CM | POA: Diagnosis not present

## 2021-05-17 ENCOUNTER — Telehealth: Payer: Self-pay | Admitting: Internal Medicine

## 2021-05-17 NOTE — Telephone Encounter (Signed)
Please let patient know with phone call or letter that the polyp(s) removed were tubular adenoma(s).  We will need to repeat colonoscopy in 5 years as we discussed postoperatively.  Follow-up with GI as needed.  Thank you

## 2021-05-17 NOTE — Telephone Encounter (Signed)
PLEASE CALL PATIENT ABOUT HIS PROCEDURE RESULTS, HAS NOT HEARD ANYTHING

## 2021-05-17 NOTE — Telephone Encounter (Signed)
Dr. Abbey Chatters, Pt called and he is wanting his results from his procedure. You can send to me and I can call him  Thank you Dena

## 2021-05-18 ENCOUNTER — Other Ambulatory Visit: Payer: Self-pay | Admitting: Interventional Radiology

## 2021-05-18 ENCOUNTER — Encounter: Payer: Self-pay | Admitting: *Deleted

## 2021-05-18 ENCOUNTER — Other Ambulatory Visit: Payer: Self-pay

## 2021-05-18 ENCOUNTER — Ambulatory Visit
Admission: RE | Admit: 2021-05-18 | Discharge: 2021-05-18 | Disposition: A | Discharge status: Home / Self Care | Payer: Medicare Other | Source: Ambulatory Visit | Attending: Nurse Practitioner | Admitting: Nurse Practitioner

## 2021-05-18 DIAGNOSIS — K769 Liver disease, unspecified: Secondary | ICD-10-CM

## 2021-05-18 DIAGNOSIS — C220 Liver cell carcinoma: Secondary | ICD-10-CM | POA: Diagnosis not present

## 2021-05-18 HISTORY — PX: IR RADIOLOGIST EVAL & MGMT: IMG5224

## 2021-05-18 NOTE — Telephone Encounter (Signed)
Steven [name] please NIC for 5 year colonoscopy   Phoned and advised the pt of his result note and to return as needed. Pt expressed understanding

## 2021-05-18 NOTE — Consult Note (Signed)
Chief Complaint: Indeterminate liver lesion worrisome for hepatocellular carcinoma   Referring Physician(s): Drazek,Dawn  History of Present Illness: Steven [name] is a 70 y.o. male with past medical history significant for hepatitis C (treated with Harvoni in 2018) and alcoholism who has been referred for potential percutaneous management of worrisome lesion within the dome of the anterior segment of the right lobe of the liver which demonstrates imaging characteristics compatible with a LI-RADS 5 lesion given provided history of hepatitis C and alcoholism.  Patient remains asymptomatic in regards to this incidentally discovered liver lesion.  Specifically, no abdominal pain, unintentional weight loss, change in appetite or energy level.  No yellowing of the skin or eyes.  No change in mental status.  Patient has ceased alcohol use for the past 2 months secondary to biliary colic.  Prior to this, he was drinking approximately 6-7 beers per day.  Patient continues to smoke.  Past Medical History:  Diagnosis Date   Cancer ([place])    skin   EtOH dependence ([place])    Hepatitis C    successfully eradicated with Harvoni in 2018 with SVR documented   Hypertension    Shoulder impingement 2004    Past Surgical History:  Procedure Laterality Date   COLONOSCOPY WITH PROPOFOL N/A 05/03/2021   Procedure: COLONOSCOPY WITH PROPOFOL;  Surgeon: Eloise Harman, DO;  Location: [facility];  Service: Endoscopy;  Laterality: N/A;  7:30 / ASA III   FRACTURE SURGERY Left    wrist   POLYPECTOMY  05/03/2021   Procedure: POLYPECTOMY;  Surgeon: Eloise Harman, DO;  Location: [facility];  Service: Endoscopy;;  ascending,descending   SHOULDER ACROMIOPLASTY Right 11/20/2012   Procedure: SHOULDER ACROMIOPLASTY;  Surgeon: Sanjuana Kava, MD;  Location: [facility];  Service: Orthopedics;  Laterality: Right;   SKIN CANCER EXCISION Left 2013   wrist surgery fell off a roof   STOMACH SURGERY     had  bleeding ulcers repaired. age 63   TOTAL KNEE ARTHROPLASTY Right 11/04/2015   Procedure: RIGHT TOTAL KNEE ARTHROPLASTY;  Surgeon: Carole Civil, MD;  Location: [facility];  Service: Orthopedics;  Laterality: Right;    Allergies: Patient has no known allergies.  Medications: Prior to Admission medications   Medication Sig Start Date End Date Taking? Authorizing Provider  aspirin-acetaminophen-caffeine (EXCEDRIN MIGRAINE) [phone] MG tablet Take 1 tablet by mouth every 6 (six) hours as needed for headache or migraine.    [provider]  buPROPion (WELLBUTRIN XL) 300 MG 24 hr tablet Take 1 tablet by mouth every morning. 09/18/20   [provider]  cloNIDine (CATAPRES) 0.1 MG tablet Take 0.1 mg by mouth at bedtime. 07/31/16   [provider]  diazepam (VALIUM) 10 MG tablet Take 1 tablet (10 mg total) by mouth once as needed for up to 1 dose for anxiety (take 30-60 minutes before scheduled MRI. Do not drive.). 04/12/21   Mahala Menghini, PA-C  furosemide (LASIX) 20 MG tablet Take 10 mg by mouth 2 (two) times daily. 08/04/20   [provider]  gabapentin (NEURONTIN) 600 MG tablet Take 600 mg by mouth 3 (three) times daily. 09/18/20   [provider]  lisinopril (ZESTRIL) 5 MG tablet Take 5 mg by mouth daily. 08/04/20   [provider]  SUBOXONE 8-2 MG FILM Take 1 Film by mouth daily. 07/25/16   [provider]  triamcinolone cream (KENALOG) 0.1 % Apply 1 application topically daily as needed (rash/itching).    [provider]  VELTASSA 8.4 g packet Take 8.4 g by mouth 2 (two) times a week. 03/29/21   [provider]     Family History  Problem Relation Age of Onset   Liver disease Neg Hx    Colon cancer Neg Hx     Social History   Socioeconomic History   Marital status: Divorced    Spouse name: Not on file   Number of children: Not on file   Years of education: Not on file   Highest education level: Not on file   Occupational History   Not on file  Tobacco Use   Smoking status: Every Day    Packs/day: 1.00    Years: 30.00    Pack years: 30.00    Types: Cigars, Cigarettes   Smokeless tobacco: Never  Vaping Use   Vaping Use: Never used  Substance and Sexual Activity   Alcohol use: Not Currently    Comment: 09/22/20 stopped drinking beer   Drug use: No    Frequency: 7.0 times per week    Types: Marijuana    Comment: says no  drugs 02/04/2014   Sexual activity: Yes  Other Topics Concern   Not on file  Social History Narrative   Not on file   Social Determinants of Health   Financial Resource Strain: Not on file  Food Insecurity: Not on file  Transportation Needs: Not on file  Physical Activity: Not on file  Stress: Not on file  Social Connections: Not on file    ECOG Status: 0 - Asymptomatic  Review of Systems  Review of Systems: A 12 point ROS discussed and pertinent positives are indicated in the HPI above.  All other systems are negative.  Physical Exam No direct physical exam was performed (except for noted visual exam findings with Video Visits).   Vital Signs: There were no vitals taken for this visit.  Imaging:  Following examinations were reviewed in detail:  Abdominal ultrasound-10/02/2020 Abdominal MRI-11/28/2020; 04/24/2021  Review of abdominal MRI performed 04/24/2021 it demonstrates an approximately 2.0 x 1.9 cm enhancing nodule within the dome of the anterior segments of the right lobe of the liver (image 40, series 12), increased in size compared to the 620 10/2020 examination, previously, 1.0 cm.  The lesion demonstrates washout characteristics and capsular enhancement, findings compatible with a LI-RADS 5 lesion given provided history of hepatitis C and alcoholism.  No additional discrete hepatic lesions are identified.  Portal vein appears patent.  MR ABDOMEN WWO CONTRAST  Result Date: 04/26/2021 CLINICAL DATA:  Follow-up liver lesion EXAM: MRI ABDOMEN  WITHOUT AND WITH CONTRAST TECHNIQUE: Multiplanar multisequence MR imaging of the abdomen was performed both before and after the administration of intravenous contrast. CONTRAST:  12mL MULTIHANCE GADOBENATE DIMEGLUMINE 529 MG/ML IV SOLN COMPARISON:  11/28/2020 FINDINGS: Lower chest: No acute findings. Elevation of the left hemidiaphragm. Hepatobiliary: Arterially enhancing lesion in hepatic segment VIII is significantly increased in size, measuring 2.0 x 1.9 cm, previously no greater than 1.0 cm (series 12, image 40). This lesion clearly demonstrates washout and probable capsular enhancement (series 19, image 40). Gallstones in the dependent gallbladder. No biliary ductal dilatation, maximum caliber of the common bile duct on this examination 0.6 cm. Pancreas: No mass, inflammatory changes, or other parenchymal abnormality identified. No pancreatic ductal dilatation. Spleen:  Within normal limits in size and appearance. Adrenals/Urinary Tract: No masses identified. No evidence of hydronephrosis. Stomach/Bowel: Visualized portions within the abdomen are unremarkable. Vascular/Lymphatic: No pathologically enlarged lymph nodes  identified. No abdominal aortic aneurysm demonstrated. Aortic atherosclerosis. Other:  None. Musculoskeletal: No suspicious bone lesions identified. IMPRESSION: 1. Significant interval increase in size of hepatic segment VIII lesion, now measuring up to 2.0 cm, previously no greater than 1.0 cm. This demonstrates washout and probable capsular enhancement, highly suspicious for hepatocellular carcinoma, and again, if LI-RADS criteria apply (i.e., known diagnosis of cirrhosis or chronic hepatitis), this lesion is LI-RADS category 5. 2. No overt morphologic stigmata of cirrhosis. 3. Cholelithiasis. Electronically Signed   By: Delanna Ahmadi M.D.   On: 04/26/2021 13:43    Labs:  CBC: Recent Labs    04/27/21 0950  WBC 8.3  HGB 13.5  HCT 41.3  PLT 276    COAGS: Recent Labs     01/28/21 1435 04/27/21 0950  INR 1.0 0.9    BMP: Recent Labs    01/15/21 0825 04/27/21 0950  NA  --  137  K  --  5.0  CL  --  104  CO2  --  25  GLUCOSE  --  115*  BUN  --  25*  CALCIUM  --  9.2  CREATININE 1.57* 1.52*  GFRNONAA  --  50*    LIVER FUNCTION TESTS: Recent Labs    04/27/21 0950  BILITOT 0.4  AST 16  ALT 15  ALKPHOS 71  PROT 7.2  ALBUMIN 4.4    TUMOR MARKERS: Recent Labs    01/28/21 1435  AFPTM 1.8    Assessment and Plan:  Steven [name] is a 70 y.o. male with past medical history significant for hepatitis C (treated with Harvoni in 2018) and alcoholism who has been referred for potential percutaneous management of worrisome lesion within the dome of the anterior segment of the right lobe of the liver which demonstrates imaging characteristics compatible with a LI-RADS 5 lesion given provided history of hepatitis C and alcoholism.  Patient remains asymptomatic in regards to this incidentally discovered liver lesion.    Following examinations were reviewed in detail: Abdominal ultrasound-10/02/2020 Abdominal MRI-11/28/2020; 04/24/2021  Review of abdominal MRI performed 04/24/2021 it demonstrates an approximately 2.0 x 1.9 cm enhancing nodule within the dome of the anterior segments of the right lobe of the liver (image 40, series 12), increased in size compared to the 620 10/2020 examination, previously, 1.0 cm.  The lesion demonstrates washout characteristics and capsular enhancement, findings compatible with a LI-RADS 5 lesion given provided history of hepatitis C and alcoholism.  No additional discrete hepatic lesions are identified.  Portal vein appears patent.  Given the size (less than 3 cm) of this solitary lesion, with preservation of his LFTs and patency of the portal vein, I think the patient is a fair candidate for attempted image guided microwave ablation with intraprocedural angiogram and bland embolization with the limiting factor being the  fact the lesion is located within the dome of the right lobe of the liver, potentially with poor sonographic visualization both due to the location as well as coarsened hepatic echogenicity demonstrated on abdominal MRI performed 10/02/2020.  Regardless, I feel it is warranted to attempt image guided ablation with intraprocedural angiogram and bland embolization as if successful, this treatment modality could be performed with curative intent.  Prolonged conversations were held with the patient regarding the benefits and risks (including but not limited to incomplete lesion treatment, bleeding/vessel injury, infection and injury to adjacent organ, in this case, particularly the right hemidiaphragm) of the microwave ablation.  I explained that the procedure is performed at [facility]  long hospital with general anesthesia.  The procedure entails an overnight admission for continued observation as well as PCA usage.  I explained that given the size and location of the lesion a biopsy will likely not be performed as the lesion demonstrates MRI characteristics compatible with hepatocellular carcinoma.  I explained that I will first obtain a cirrhotic protocol CT of the abdomen pelvis to delineate the hepatic arterial supply to the solitary hypervascular liver lesion to assist with the intraprocedural angiogram and bland embolization.  Following this prolonged and detailed conversation, the patient wishes to pursue attempted imaged guided microwave ablation with intraprocedural angiogram and bland embolization.  Patient was encouraged to continue his abstinence from alcohol.  Plan: - Obtain a planning cirrhotic protocol CTA of the abdomen pelvis (to delineate hepatic arterial supply to the solitary hypervascular liver lesion for intraprocedural angiogram and bland embolization) - Schedule percutaneous microwave ablation with intraprocedural angiogram and bland embolization of solitary liver lesion at [facility] long  hospital in the interventional radiology suite with general anesthesia.  I will be assisted during this procedure by my interventional radiology colleague, Dr. Laurence Ferrari.  The procedure will entail an overnight admission for continued observation and PCA usage.  Thank you for this interesting consult.  I greatly enjoyed meeting Steven [name] and look forward to participating in their care.  A copy of this report was sent to the requesting provider on this date.  Electronically Signed: Sandi Mariscal 05/18/2021, 11:13 AM   I spent a total of 30 Minutes in remote  clinical consultation, greater than 50% of which was counseling/coordinating care for hepatocellular carcinoma.    Visit type: Audio only (telephone). Audio (no video) only due to patient's lack of internet/smartphone capability. Alternative for in-person consultation at [facility], [address] [place], [place], [place]. This visit type was conducted due to national recommendations for restrictions regarding the COVID-19 Pandemic (e.g. social distancing).  This format is felt to be most appropriate for this patient at this time.  All issues noted in this document were discussed and addressed.

## 2021-05-21 ENCOUNTER — Other Ambulatory Visit (HOSPITAL_COMMUNITY): Payer: Medicare Other

## 2021-05-21 DIAGNOSIS — N1831 Chronic kidney disease, stage 3a: Secondary | ICD-10-CM | POA: Diagnosis not present

## 2021-05-21 DIAGNOSIS — E559 Vitamin D deficiency, unspecified: Secondary | ICD-10-CM | POA: Diagnosis not present

## 2021-05-21 DIAGNOSIS — I129 Hypertensive chronic kidney disease with stage 1 through stage 4 chronic kidney disease, or unspecified chronic kidney disease: Secondary | ICD-10-CM | POA: Diagnosis not present

## 2021-05-21 DIAGNOSIS — E875 Hyperkalemia: Secondary | ICD-10-CM | POA: Diagnosis not present

## 2021-05-21 DIAGNOSIS — D638 Anemia in other chronic diseases classified elsewhere: Secondary | ICD-10-CM | POA: Diagnosis not present

## 2021-06-04 DIAGNOSIS — I129 Hypertensive chronic kidney disease with stage 1 through stage 4 chronic kidney disease, or unspecified chronic kidney disease: Secondary | ICD-10-CM | POA: Diagnosis not present

## 2021-06-04 DIAGNOSIS — E559 Vitamin D deficiency, unspecified: Secondary | ICD-10-CM | POA: Diagnosis not present

## 2021-06-04 DIAGNOSIS — N1832 Chronic kidney disease, stage 3b: Secondary | ICD-10-CM | POA: Diagnosis not present

## 2021-06-04 DIAGNOSIS — D638 Anemia in other chronic diseases classified elsewhere: Secondary | ICD-10-CM | POA: Diagnosis not present

## 2021-06-14 DIAGNOSIS — N1832 Chronic kidney disease, stage 3b: Secondary | ICD-10-CM | POA: Diagnosis not present

## 2021-06-14 DIAGNOSIS — I10 Essential (primary) hypertension: Secondary | ICD-10-CM | POA: Diagnosis not present

## 2021-06-21 DIAGNOSIS — Z79891 Long term (current) use of opiate analgesic: Secondary | ICD-10-CM | POA: Diagnosis not present

## 2021-06-24 ENCOUNTER — Ambulatory Visit (HOSPITAL_COMMUNITY)
Admission: RE | Admit: 2021-06-24 | Discharge: 2021-06-24 | Disposition: A | Discharge status: Home / Self Care | Payer: Medicare Other | Source: Ambulatory Visit | Attending: Interventional Radiology | Admitting: Interventional Radiology

## 2021-06-24 ENCOUNTER — Other Ambulatory Visit: Payer: Self-pay

## 2021-06-24 DIAGNOSIS — K8020 Calculus of gallbladder without cholecystitis without obstruction: Secondary | ICD-10-CM | POA: Diagnosis not present

## 2021-06-24 DIAGNOSIS — I700 Atherosclerosis of aorta: Secondary | ICD-10-CM | POA: Diagnosis not present

## 2021-06-24 DIAGNOSIS — I714 Abdominal aortic aneurysm, without rupture, unspecified: Secondary | ICD-10-CM | POA: Diagnosis not present

## 2021-06-24 DIAGNOSIS — K769 Liver disease, unspecified: Secondary | ICD-10-CM | POA: Insufficient documentation

## 2021-06-24 LAB — POCT I-STAT CREATININE: Creatinine, Ser: 2 mg/dL — ABNORMAL HIGH (ref 0.61–1.24)

## 2021-06-24 MED ORDER — IOHEXOL 300 MG/ML  SOLN
100.0000 mL | Freq: Once | INTRAMUSCULAR | Status: AC | PRN
  Administered 2021-06-24: 100 mL via INTRAVENOUS

## 2021-07-09 ENCOUNTER — Other Ambulatory Visit (HOSPITAL_COMMUNITY): Payer: Self-pay | Admitting: Interventional Radiology

## 2021-07-09 DIAGNOSIS — K769 Liver disease, unspecified: Secondary | ICD-10-CM

## 2021-07-09 DIAGNOSIS — K7402 Hepatic fibrosis, advanced fibrosis: Secondary | ICD-10-CM | POA: Diagnosis not present

## 2021-07-09 DIAGNOSIS — C220 Liver cell carcinoma: Secondary | ICD-10-CM | POA: Diagnosis not present

## 2021-07-16 DIAGNOSIS — J449 Chronic obstructive pulmonary disease, unspecified: Secondary | ICD-10-CM | POA: Diagnosis not present

## 2021-07-16 DIAGNOSIS — I10 Essential (primary) hypertension: Secondary | ICD-10-CM | POA: Diagnosis not present

## 2021-07-16 DIAGNOSIS — N1832 Chronic kidney disease, stage 3b: Secondary | ICD-10-CM | POA: Diagnosis not present

## 2021-07-16 DIAGNOSIS — F17210 Nicotine dependence, cigarettes, uncomplicated: Secondary | ICD-10-CM | POA: Diagnosis not present

## 2021-07-16 DIAGNOSIS — Z1389 Encounter for screening for other disorder: Secondary | ICD-10-CM | POA: Diagnosis not present

## 2021-07-16 DIAGNOSIS — Z0001 Encounter for general adult medical examination with abnormal findings: Secondary | ICD-10-CM | POA: Diagnosis not present

## 2021-07-28 NOTE — Progress Notes (Signed)
Sent message, via epic in basket, requesting orders in epic from surgeon.  

## 2021-08-03 ENCOUNTER — Other Ambulatory Visit: Payer: Self-pay | Admitting: Physician Assistant

## 2021-08-03 NOTE — Progress Notes (Addendum)
Anesthesia Review: ? ?PCP: DR Ronni Rumble  LOV 02/08/21  ?Roosevelt Locks, NP - liver specialist 07/09/21- LOV  ?Kidney-  DR Theador Hawthorne - LOV 06/04/21  ?Pain Specialist- DR Merlene Laughter- LOV 03/29/21  ?Cardiologist : none  ?Chest x-ray : ?EKG :04/27/21  ?07/08/19- aorta Duplex  ?LOV with Dwn Drazek,NP on 07/09/21  ?Echo : ?Stress test: ?Cardiac Cath :  ?Activity level: can do a flgiht of stairs without difficulty  ?Sleep Study/ CPAP : none  ?Fasting Blood Sugar :      / Checks Blood Sugar -- times a day:   ?Blood Thinner/ Instructions /Last Dose: ?ASA / Instructions/ Last Dose :   ?Stage 3 Kidney disease  ?Covid test on 08/16/2021 at 0930am  ?AT preop appt asked Armando Reichert in regards to Suboxone preop.  Shawn Stall replied for pt to takes as prescribed and contact prescribing physician and follow their instructions to see if they have different instructions.  PT voiced understanding.   ?Placed info above on preop instructions.Marland Kitchen  ?Smoker  ?CMP done 08/05/21 routed to Dr Sandi Mariscal.  ? ?

## 2021-08-03 NOTE — Progress Notes (Signed)
Covid test on 08/16/2021.   ?Come thru main entrance at [facility] long.  Have a seat in the lobby on the right as you come thru the door.  Call [phone] and let them know you are here for covid testing.   ?  ?            BRESLIN HEMANN ? 08/03/2021 ? ? Your procedure is scheduled on:  ?08/18/2021  ? Report to [facility] Main  Entrance ? ? Report to admitting at   0600AM ?  ? ? Call this number if you have problems the morning of surgery [phone]  ? ? Remember: Do not eat food , candy gum or mints :After Midnight. You may have clear liquids from midnight until __ ? 0530am  ? ? ?CLEAR LIQUID DIET ? ? ?Foods Allowed                                                                      ? ?Coffee and tea, regular and decaf         no milk, cream or creamer                      ?Plain Jell-O any favor except red or purple                                            ?Fruit ices (not with fruit pulp)                                      ?Iced Popsicles                                     ?Carbonated beverages, regular and diet                                    ?White cranberry, white grape and apple juices  ?Sports drinks like Gatorade ?Lightly seasoned clear broth or consume(fat free) ?Sugar ? ? ?_____________________________________________________________________ ?  ? BRUSH YOUR TEETH MORNING OF SURGERY AND RINSE YOUR MOUTH OUT, NO CHEWING GUM CANDY OR MINTS. ?  ? ? Take these medicines the morning of surgery with A SIP OF WATER:  wellbutrin, nexium, gabapentin ? ?DO NOT TAKE ANY DIABETIC MEDICATIONS DAY OF YOUR SURGERY ?                  ?            You may not have any metal on your body including hair pins and  ?            piercings  Do not wear jewelry, make-up, lotions, powders or perfumes, deodorant ?            Do not wear nail polish on your fingernails.  Do not shave  48 hours prior to surgery.  ?  Men may shave face and neck. ? ? Do not bring valuables to the hospital. [place]  NOT ?            RESPONSIBLE   FOR VALUABLES. ? Contacts, dentures or bridgework may not be worn into surgery. ? Leave suitcase in the car. After surgery it may be brought to your room. ? ?  ? Patients discharged the day of surgery will not be allowed to drive home. IF YOU ARE HAVING SURGERY AND GOING HOME THE SAME DAY, YOU MUST HAVE AN ADULT TO DRIVE YOU HOME AND BE WITH YOU FOR 24 HOURS. YOU MAY GO HOME BY TAXI OR UBER OR ORTHERWISE, BUT AN ADULT MUST ACCOMPANY YOU HOME AND STAY WITH YOU FOR 24 HOURS. ? Name and phone number of your driver: ? Special Instructions: N/A ? ?            Please read over the following fact sheets you were given: ?_____________________________________________________________________ ? ?[place] - Preparing for Surgery ?Before surgery, you can play an important role.  Because skin is not sterile, your skin needs to be as free of germs as possible.  You can reduce the number of germs on your skin by washing with CHG (chlorahexidine gluconate) soap before surgery.  CHG is an antiseptic cleaner which kills germs and bonds with the skin to continue killing germs even after washing. ?Please DO NOT use if you have an allergy to CHG or antibacterial soaps.  If your skin becomes reddened/irritated stop using the CHG and inform your nurse when you arrive at Short Stay. ?Do not shave (including legs and underarms) for at least 48 hours prior to the first CHG shower.  You may shave your face/neck. ?Please follow these instructions carefully: ? 1.  Shower with CHG Soap the night before surgery and the  morning of Surgery. ? 2.  If you choose to wash your hair, wash your hair first as usual with your  normal  shampoo. ? 3.  After you shampoo, rinse your hair and body thoroughly to remove the  shampoo.                           4.  Use CHG as you would any other liquid soap.  You can apply chg directly  to the skin and wash  ?                     Gently with a scrungie or clean washcloth. ? 5.   Apply the CHG Soap to your body ONLY FROM THE NECK DOWN.   Do not use on face/ open      ?                     Wound or open sores. Avoid contact with eyes, ears mouth and genitals (private parts).  ?                     Production manager,  Genitals (private parts) with your normal soap. ?            6.  Wash thoroughly, paying special attention to the area where your surgery  will be performed. ? 7.  Thoroughly rinse your body with warm water from the neck down. ? 8.  DO NOT shower/wash with your normal soap after using and rinsing off  the CHG Soap. ?  9.  Pat yourself dry with a clean towel. ?           10.  Wear clean pajamas. ?           11.  Place clean sheets on your bed the night of your first shower and do not  sleep with pets. ?Day of Surgery : ?Do not apply any lotions/deodorants the morning of surgery.  Please wear clean clothes to the hospital/surgery center. ? ?FAILURE TO FOLLOW THESE INSTRUCTIONS MAY RESULT IN THE CANCELLATION OF YOUR SURGERY ?PATIENT SIGNATURE_________________________________ ? ?NURSE SIGNATURE__________________________________ ? ?________________________________________________________________________  ? ?          ?

## 2021-08-05 ENCOUNTER — Encounter (HOSPITAL_COMMUNITY): Payer: Self-pay

## 2021-08-05 ENCOUNTER — Encounter (HOSPITAL_COMMUNITY)
Admission: RE | Admit: 2021-08-05 | Discharge: 2021-08-05 | Disposition: A | Discharge status: Home / Self Care | Payer: Medicare Other | Source: Ambulatory Visit | Attending: Interventional Radiology | Admitting: Interventional Radiology

## 2021-08-05 ENCOUNTER — Other Ambulatory Visit: Payer: Self-pay

## 2021-08-05 VITALS — BP 140/63 | HR 79 | Temp 98.2°F | Resp 16 | Ht 70.0 in | Wt 160.0 lb

## 2021-08-05 DIAGNOSIS — K769 Liver disease, unspecified: Secondary | ICD-10-CM | POA: Insufficient documentation

## 2021-08-05 DIAGNOSIS — Z01812 Encounter for preprocedural laboratory examination: Secondary | ICD-10-CM | POA: Insufficient documentation

## 2021-08-05 DIAGNOSIS — Z01818 Encounter for other preprocedural examination: Secondary | ICD-10-CM

## 2021-08-05 LAB — COMPREHENSIVE METABOLIC PANEL
ALT: 18 U/L (ref 0–44)
AST: 19 U/L (ref 15–41)
Albumin: 4.6 g/dL (ref 3.5–5.0)
Alkaline Phosphatase: 62 U/L (ref 38–126)
Anion gap: 7 (ref 5–15)
BUN: 43 mg/dL — ABNORMAL HIGH (ref 8–23)
CO2: 28 mmol/L (ref 22–32)
Calcium: 9.3 mg/dL (ref 8.9–10.3)
Chloride: 102 mmol/L (ref 98–111)
Creatinine, Ser: 1.72 mg/dL — ABNORMAL HIGH (ref 0.61–1.24)
GFR, Estimated: 42 mL/min — ABNORMAL LOW (ref >60)
Glucose, Bld: 104 mg/dL — ABNORMAL HIGH (ref 70–99)
Potassium: 4.9 mmol/L (ref 3.5–5.1)
Sodium: 137 mmol/L (ref 135–145)
Total Bilirubin: 0.4 mg/dL (ref 0.3–1.2)
Total Protein: 7.8 g/dL (ref 6.5–8.1)

## 2021-08-05 LAB — CBC WITH DIFFERENTIAL/PLATELET
Abs Immature Granulocytes: 0.03 10*3/uL (ref 0.00–0.07)
Basophils Absolute: 0.1 10*3/uL (ref 0.0–0.1)
Basophils Relative: 1 %
Eosinophils Absolute: 0.2 10*3/uL (ref 0.0–0.5)
Eosinophils Relative: 3 %
HCT: 42.3 % (ref 39.0–52.0)
Hemoglobin: 14.1 g/dL (ref 13.0–17.0)
Immature Granulocytes: 0 %
Lymphocytes Relative: 21 %
Lymphs Abs: 1.5 10*3/uL (ref 0.7–4.0)
MCH: 31.6 pg (ref 26.0–34.0)
MCHC: 33.3 g/dL (ref 30.0–36.0)
MCV: 94.8 fL (ref 80.0–100.0)
Monocytes Absolute: 0.7 10*3/uL (ref 0.1–1.0)
Monocytes Relative: 10 %
Neutro Abs: 4.6 10*3/uL (ref 1.7–7.7)
Neutrophils Relative %: 65 %
Platelets: 247 10*3/uL (ref 150–400)
RBC: 4.46 MIL/uL (ref 4.22–5.81)
RDW: 12.8 % (ref 11.5–15.5)
WBC: 7 10*3/uL (ref 4.0–10.5)
nRBC: 0 % (ref 0.0–0.2)

## 2021-08-05 LAB — TYPE AND SCREEN
ABO/RH(D): A POS
Antibody Screen: NEGATIVE

## 2021-08-05 LAB — PROTIME-INR
INR: 1.1 (ref 0.8–1.2)
Prothrombin Time: 13.7 seconds (ref 11.4–15.2)

## 2021-08-09 DIAGNOSIS — I10 Essential (primary) hypertension: Secondary | ICD-10-CM | POA: Diagnosis not present

## 2021-08-09 DIAGNOSIS — N1832 Chronic kidney disease, stage 3b: Secondary | ICD-10-CM | POA: Diagnosis not present

## 2021-08-09 DIAGNOSIS — Z0001 Encounter for general adult medical examination with abnormal findings: Secondary | ICD-10-CM | POA: Diagnosis not present

## 2021-08-12 NOTE — Addendum Note (Signed)
Encounter addended by: Annie Paras on: 08/12/2021 5:42 PM  Actions taken: Letter saved

## 2021-08-13 DIAGNOSIS — I10 Essential (primary) hypertension: Secondary | ICD-10-CM | POA: Diagnosis not present

## 2021-08-13 DIAGNOSIS — N1832 Chronic kidney disease, stage 3b: Secondary | ICD-10-CM | POA: Diagnosis not present

## 2021-08-16 ENCOUNTER — Other Ambulatory Visit: Payer: Self-pay

## 2021-08-16 ENCOUNTER — Encounter (HOSPITAL_COMMUNITY)
Admission: RE | Admit: 2021-08-16 | Discharge: 2021-08-16 | Disposition: A | Discharge status: Home / Self Care | Payer: Medicare Other | Source: Ambulatory Visit | Attending: Interventional Radiology | Admitting: Interventional Radiology

## 2021-08-16 DIAGNOSIS — Z20822 Contact with and (suspected) exposure to covid-19: Secondary | ICD-10-CM | POA: Diagnosis not present

## 2021-08-16 DIAGNOSIS — Z01812 Encounter for preprocedural laboratory examination: Secondary | ICD-10-CM

## 2021-08-16 LAB — SARS CORONAVIRUS 2 (TAT 6-24 HRS): SARS Coronavirus 2: NEGATIVE

## 2021-08-17 ENCOUNTER — Other Ambulatory Visit: Payer: Self-pay | Admitting: Radiology

## 2021-08-17 NOTE — Anesthesia Preprocedure Evaluation (Addendum)
Anesthesia Evaluation  ?Patient identified by MRN, date of birth, ID band ?Patient awake ? ? ? ?Reviewed: ?Allergy & Precautions, H&P , NPO status , Patient's Chart, lab work & pertinent test results ? ?Airway ?Mallampati: I ? ?TM Distance: >3 FB ?Neck ROM: Full ? ? ? Dental ? ?(+) Edentulous Upper, Edentulous Lower, Dental Advisory Given ?  ?Pulmonary ?Current SmokerPatient did not abstain from smoking.,  ?  ?Pulmonary exam normal ? ? ? ? ? ? ? Cardiovascular ?Exercise Tolerance: Good ?hypertension, Pt. on medications ?Normal cardiovascular exam ? ? ?  ?Neuro/Psych ?negative neurological ROS ? negative psych ROS  ? GI/Hepatic ?negative GI ROS, (+)  ?  ? substance abuse ? alcohol use and marijuana use, Liver mass ?Hx Hepatitis C ?  ?Endo/Other  ?negative endocrine ROS ? Renal/GU ?negative Renal ROS  ?negative genitourinary ?  ?Musculoskeletal ? ?(+) narcotic dependent ? Abdominal ?  ?Peds ? Hematology ?negative hematology ROS ?(+)   ?Anesthesia Other Findings ? ? Reproductive/Obstetrics ?negative OB ROS ? ?  ? ? ? ? ? ? ? ? ? ? ? ? ? ?  ?  ? ? ? ? ? ? ?Anesthesia Physical ? ?Anesthesia Plan ? ?ASA: 3 ? ?Anesthesia Plan: General  ? ?Post-op Pain Management: Minimal or no pain anticipated  ? ?Induction: Intravenous ? ?PONV Risk Score and Plan: 2 and Ondansetron, Midazolam and Dexamethasone ? ?Airway Management Planned: Oral ETT ? ?Additional Equipment:  ? ?Intra-op Plan:  ? ?Post-operative Plan: Extubation in OR ? ?Informed Consent: I have reviewed the patients History and Physical, chart, labs and discussed the procedure including the risks, benefits and alternatives for the proposed anesthesia with the patient or authorized representative who has indicated his/her understanding and acceptance.  ? ? ? ?Dental advisory given ? ?Plan Discussed with: Anesthesiologist and CRNA ? ?Anesthesia Plan Comments:   ? ? ? ? ? ?Anesthesia Quick Evaluation ? ?

## 2021-08-18 ENCOUNTER — Ambulatory Visit (HOSPITAL_COMMUNITY): Payer: Medicare Other | Admitting: Physician Assistant

## 2021-08-18 ENCOUNTER — Ambulatory Visit (HOSPITAL_BASED_OUTPATIENT_CLINIC_OR_DEPARTMENT_OTHER): Payer: Medicare Other | Admitting: Anesthesiology

## 2021-08-18 ENCOUNTER — Encounter (HOSPITAL_COMMUNITY)
Admission: RE | Disposition: A | Discharge status: Home / Self Care | Payer: Self-pay | Source: Home / Self Care | Attending: Interventional Radiology

## 2021-08-18 ENCOUNTER — Ambulatory Visit (HOSPITAL_COMMUNITY)
Admission: RE | Admit: 2021-08-18 | Discharge: 2021-08-18 | Disposition: A | Discharge status: Home / Self Care | Payer: Medicare Other | Source: Ambulatory Visit | Attending: Interventional Radiology | Admitting: Interventional Radiology

## 2021-08-18 ENCOUNTER — Encounter (HOSPITAL_COMMUNITY): Payer: Self-pay | Admitting: Interventional Radiology

## 2021-08-18 ENCOUNTER — Ambulatory Visit (HOSPITAL_COMMUNITY): Payer: Medicare Other

## 2021-08-18 ENCOUNTER — Other Ambulatory Visit: Payer: Self-pay

## 2021-08-18 ENCOUNTER — Observation Stay (HOSPITAL_COMMUNITY)
Admission: RE | Admit: 2021-08-18 | Discharge: 2021-08-19 | Disposition: A | Discharge status: Home / Self Care | Payer: Medicare Other | Attending: Interventional Radiology | Admitting: Interventional Radiology

## 2021-08-18 ENCOUNTER — Observation Stay (HOSPITAL_COMMUNITY): Payer: Medicare Other

## 2021-08-18 DIAGNOSIS — J90 Pleural effusion, not elsewhere classified: Secondary | ICD-10-CM | POA: Insufficient documentation

## 2021-08-18 DIAGNOSIS — Z96651 Presence of right artificial knee joint: Secondary | ICD-10-CM | POA: Insufficient documentation

## 2021-08-18 DIAGNOSIS — M25511 Pain in right shoulder: Secondary | ICD-10-CM

## 2021-08-18 DIAGNOSIS — Z87891 Personal history of nicotine dependence: Secondary | ICD-10-CM | POA: Diagnosis not present

## 2021-08-18 DIAGNOSIS — Z7982 Long term (current) use of aspirin: Secondary | ICD-10-CM | POA: Insufficient documentation

## 2021-08-18 DIAGNOSIS — I10 Essential (primary) hypertension: Secondary | ICD-10-CM | POA: Insufficient documentation

## 2021-08-18 DIAGNOSIS — Z79899 Other long term (current) drug therapy: Secondary | ICD-10-CM | POA: Diagnosis not present

## 2021-08-18 DIAGNOSIS — K769 Liver disease, unspecified: Secondary | ICD-10-CM

## 2021-08-18 DIAGNOSIS — Z85828 Personal history of other malignant neoplasm of skin: Secondary | ICD-10-CM | POA: Diagnosis not present

## 2021-08-18 DIAGNOSIS — C220 Liver cell carcinoma: Principal | ICD-10-CM | POA: Insufficient documentation

## 2021-08-18 DIAGNOSIS — Z01818 Encounter for other preprocedural examination: Secondary | ICD-10-CM

## 2021-08-18 DIAGNOSIS — J986 Disorders of diaphragm: Secondary | ICD-10-CM | POA: Diagnosis not present

## 2021-08-18 LAB — BASIC METABOLIC PANEL
Anion gap: 9 (ref 5–15)
BUN: 30 mg/dL — ABNORMAL HIGH (ref 8–23)
CO2: 25 mmol/L (ref 22–32)
Calcium: 9.1 mg/dL (ref 8.9–10.3)
Chloride: 103 mmol/L (ref 98–111)
Creatinine, Ser: 1.51 mg/dL — ABNORMAL HIGH (ref 0.61–1.24)
GFR, Estimated: 50 mL/min — ABNORMAL LOW (ref >60)
Glucose, Bld: 107 mg/dL — ABNORMAL HIGH (ref 70–99)
Potassium: 4.9 mmol/L (ref 3.5–5.1)
Sodium: 137 mmol/L (ref 135–145)

## 2021-08-18 SURGERY — RADIO FREQUENCY ABLATION
Anesthesia: General

## 2021-08-18 MED ORDER — IOHEXOL 300 MG/ML  SOLN
100.0000 mL | Freq: Once | INTRAMUSCULAR | Status: AC | PRN
  Administered 2021-08-18: 20 mL via INTRA_ARTERIAL

## 2021-08-18 MED ORDER — CHLORHEXIDINE GLUCONATE 0.12 % MT SOLN
15.0000 mL | Freq: Once | OROMUCOSAL | Status: AC
  Administered 2021-08-18: 15 mL via OROMUCOSAL

## 2021-08-18 MED ORDER — GABAPENTIN 300 MG PO CAPS
600.0000 mg | ORAL_CAPSULE | Freq: Three times a day (TID) | ORAL | Status: DC
  Administered 2021-08-18 – 2021-08-19 (×3): 600 mg via ORAL
  Filled 2021-08-18 (×3): qty 2

## 2021-08-18 MED ORDER — DIPHENHYDRAMINE HCL 12.5 MG/5ML PO ELIX
12.5000 mg | ORAL_SOLUTION | Freq: Four times a day (QID) | ORAL | Status: DC | PRN
  Filled 2021-08-18: qty 5

## 2021-08-18 MED ORDER — FENTANYL CITRATE PF 50 MCG/ML IJ SOSY
25.0000 ug | PREFILLED_SYRINGE | INTRAMUSCULAR | Status: DC | PRN
  Administered 2021-08-18 (×3): 50 ug via INTRAVENOUS

## 2021-08-18 MED ORDER — SODIUM CHLORIDE 0.9 % IV SOLN
INTRAVENOUS | Status: DC | PRN
  Administered 2021-08-18: 10 mL/h via INTRAVENOUS

## 2021-08-18 MED ORDER — AMISULPRIDE (ANTIEMETIC) 5 MG/2ML IV SOLN: 10.0000 mg | Freq: Once | INTRAVENOUS | Status: DC | PRN

## 2021-08-18 MED ORDER — HEPARIN SODIUM (PORCINE) 1000 UNIT/ML IJ SOLN
INTRAMUSCULAR | Status: AC
  Filled 2021-08-18: qty 10

## 2021-08-18 MED ORDER — DIPHENHYDRAMINE HCL 50 MG/ML IJ SOLN: 12.5000 mg | Freq: Four times a day (QID) | INTRAMUSCULAR | Status: DC | PRN

## 2021-08-18 MED ORDER — FUROSEMIDE 20 MG PO TABS
10.0000 mg | ORAL_TABLET | Freq: Two times a day (BID) | ORAL | Status: DC
  Administered 2021-08-18 – 2021-08-19 (×2): 10 mg via ORAL
  Filled 2021-08-18 (×2): qty 1

## 2021-08-18 MED ORDER — VERAPAMIL HCL 2.5 MG/ML IV SOLN
INTRAVENOUS | Status: AC
  Filled 2021-08-18: qty 2

## 2021-08-18 MED ORDER — PHENYLEPHRINE 40 MCG/ML (10ML) SYRINGE FOR IV PUSH (FOR BLOOD PRESSURE SUPPORT)
PREFILLED_SYRINGE | INTRAVENOUS | Status: DC | PRN
  Administered 2021-08-18: 80 ug via INTRAVENOUS
  Administered 2021-08-18: 120 ug via INTRAVENOUS

## 2021-08-18 MED ORDER — PANTOPRAZOLE SODIUM 40 MG PO TBEC
40.0000 mg | DELAYED_RELEASE_TABLET | Freq: Every day | ORAL | Status: DC
  Administered 2021-08-19: 40 mg via ORAL
  Filled 2021-08-18: qty 1

## 2021-08-18 MED ORDER — MIDAZOLAM HCL 2 MG/2ML IJ SOLN
INTRAMUSCULAR | Status: DC | PRN
Start: 2021-08-18 — End: 2021-08-18
  Administered 2021-08-18: 2 mg via INTRAVENOUS

## 2021-08-18 MED ORDER — SODIUM CHLORIDE 0.9 % IV SOLN
2.0000 g | Freq: Once | INTRAVENOUS | Status: AC
  Administered 2021-08-18: 2 g via INTRAVENOUS
  Filled 2021-08-18: qty 2

## 2021-08-18 MED ORDER — NALOXONE HCL 0.4 MG/ML IJ SOLN: 0.4000 mg | INTRAMUSCULAR | Status: DC | PRN

## 2021-08-18 MED ORDER — ROCURONIUM BROMIDE 10 MG/ML (PF) SYRINGE
PREFILLED_SYRINGE | INTRAVENOUS | Status: DC | PRN
  Administered 2021-08-18: 100 mg via INTRAVENOUS
  Administered 2021-08-18: 20 mg via INTRAVENOUS

## 2021-08-18 MED ORDER — MIDAZOLAM HCL 2 MG/2ML IJ SOLN
INTRAMUSCULAR | Status: AC
  Filled 2021-08-18: qty 4

## 2021-08-18 MED ORDER — PROPOFOL 10 MG/ML IV BOLUS
INTRAVENOUS | Status: DC | PRN
Start: 2021-08-18 — End: 2021-08-18
  Administered 2021-08-18: 170 mg via INTRAVENOUS

## 2021-08-18 MED ORDER — BUPRENORPHINE HCL-NALOXONE HCL 8-2 MG SL SUBL
1.0000 | SUBLINGUAL_TABLET | Freq: Every day | SUBLINGUAL | Status: DC
  Administered 2021-08-19: 1 via SUBLINGUAL
  Filled 2021-08-18: qty 1

## 2021-08-18 MED ORDER — SUGAMMADEX SODIUM 500 MG/5ML IV SOLN
INTRAVENOUS | Status: DC | PRN
  Administered 2021-08-18: 400 mg via INTRAVENOUS

## 2021-08-18 MED ORDER — LIDOCAINE HCL (PF) 1 % IJ SOLN: 5.0000 mL | Freq: Once | INTRAMUSCULAR | Status: AC

## 2021-08-18 MED ORDER — EPHEDRINE SULFATE-NACL 50-0.9 MG/10ML-% IV SOSY
PREFILLED_SYRINGE | INTRAVENOUS | Status: DC | PRN
  Administered 2021-08-18: 10 mg via INTRAVENOUS

## 2021-08-18 MED ORDER — LIDOCAINE HCL 1 % IJ SOLN
INTRAMUSCULAR | Status: AC
  Filled 2021-08-18: qty 20

## 2021-08-18 MED ORDER — LIDOCAINE 2% (20 MG/ML) 5 ML SYRINGE
INTRAMUSCULAR | Status: DC | PRN
Start: 2021-08-18 — End: 2021-08-18
  Administered 2021-08-18: 100 mg via INTRAVENOUS

## 2021-08-18 MED ORDER — FENTANYL CITRATE (PF) 250 MCG/5ML IJ SOLN
INTRAMUSCULAR | Status: DC | PRN
Start: 2021-08-18 — End: 2021-08-18
  Administered 2021-08-18: 50 ug via INTRAVENOUS
  Administered 2021-08-18: 100 ug via INTRAVENOUS
  Administered 2021-08-18: 50 ug via INTRAVENOUS

## 2021-08-18 MED ORDER — VITAMIN D 25 MCG (1000 UNIT) PO TABS
1000.0000 [IU] | ORAL_TABLET | Freq: Every day | ORAL | Status: DC
  Administered 2021-08-19: 1000 [IU] via ORAL
  Filled 2021-08-18: qty 1

## 2021-08-18 MED ORDER — IOHEXOL 300 MG/ML  SOLN
100.0000 mL | Freq: Once | INTRAMUSCULAR | Status: AC | PRN
  Administered 2021-08-18: 100 mL via INTRA_ARTERIAL

## 2021-08-18 MED ORDER — ONDANSETRON HCL 4 MG/2ML IJ SOLN
INTRAMUSCULAR | Status: DC | PRN
  Administered 2021-08-18: 4 mg via INTRAVENOUS

## 2021-08-18 MED ORDER — FENTANYL CITRATE (PF) 250 MCG/5ML IJ SOLN
INTRAMUSCULAR | Status: AC
  Filled 2021-08-18: qty 10

## 2021-08-18 MED ORDER — PHENYLEPHRINE HCL-NACL 20-0.9 MG/250ML-% IV SOLN
INTRAVENOUS | Status: DC | PRN
  Administered 2021-08-18: 25 ug/min via INTRAVENOUS

## 2021-08-18 MED ORDER — DEXAMETHASONE SODIUM PHOSPHATE 10 MG/ML IJ SOLN
INTRAMUSCULAR | Status: DC | PRN
  Administered 2021-08-18: 5 mg via INTRAVENOUS

## 2021-08-18 MED ORDER — KETOROLAC TROMETHAMINE 30 MG/ML IJ SOLN
30.0000 mg | Freq: Once | INTRAMUSCULAR | Status: AC
  Administered 2021-08-18: 30 mg via INTRAVENOUS
  Filled 2021-08-18: qty 1

## 2021-08-18 MED ORDER — FENTANYL CITRATE PF 50 MCG/ML IJ SOSY
PREFILLED_SYRINGE | INTRAMUSCULAR | Status: AC
  Filled 2021-08-18: qty 3

## 2021-08-18 MED ORDER — BUPROPION HCL ER (XL) 300 MG PO TB24
300.0000 mg | ORAL_TABLET | Freq: Every morning | ORAL | Status: DC
  Administered 2021-08-19: 300 mg via ORAL
  Filled 2021-08-18: qty 1

## 2021-08-18 MED ORDER — LISINOPRIL 5 MG PO TABS
5.0000 mg | ORAL_TABLET | Freq: Every day | ORAL | Status: DC
  Administered 2021-08-19: 5 mg via ORAL
  Filled 2021-08-18: qty 1

## 2021-08-18 MED ORDER — ONDANSETRON HCL 4 MG/2ML IJ SOLN: 4.0000 mg | Freq: Four times a day (QID) | INTRAMUSCULAR | Status: DC | PRN

## 2021-08-18 MED ORDER — SODIUM CHLORIDE 0.9% FLUSH: 9.0000 mL | INTRAVENOUS | Status: DC | PRN

## 2021-08-18 MED ORDER — HYDROMORPHONE 1 MG/ML IV SOLN
INTRAVENOUS | Status: DC
  Administered 2021-08-18: 5.3 mg via INTRAVENOUS
  Administered 2021-08-18: 30 mg via INTRAVENOUS
  Administered 2021-08-18: 3.6 mg via INTRAVENOUS
  Administered 2021-08-19: 3.9 mg via INTRAVENOUS
  Administered 2021-08-19: 3.6 mg via INTRAVENOUS
  Administered 2021-08-19: 4.2 mg via INTRAVENOUS
  Filled 2021-08-18 (×11): qty 30

## 2021-08-18 MED ORDER — LACTATED RINGERS IV SOLN: INTRAVENOUS | Status: DC

## 2021-08-18 MED ORDER — NITROGLYCERIN IN D5W 100-5 MCG/ML-% IV SOLN
INTRAVENOUS | Status: AC
  Filled 2021-08-18: qty 250

## 2021-08-18 MED ORDER — CLONIDINE HCL 0.1 MG PO TABS
0.1000 mg | ORAL_TABLET | Freq: Every day | ORAL | Status: DC
  Administered 2021-08-18: 0.1 mg via ORAL
  Filled 2021-08-18: qty 1

## 2021-08-18 MED ORDER — PROCHLORPERAZINE EDISYLATE 10 MG/2ML IJ SOLN: 10.0000 mg | Freq: Once | INTRAMUSCULAR | Status: DC

## 2021-08-18 MED ORDER — FENTANYL CITRATE (PF) 100 MCG/2ML IJ SOLN
INTRAMUSCULAR | Status: AC
  Filled 2021-08-18: qty 4

## 2021-08-18 NOTE — Sedation Documentation (Signed)
Anesthesia in to sedate and monitor. 

## 2021-08-18 NOTE — H&P (Signed)
? ? ?Referring Physician(s): ?Drazek,D ? ?Supervising Physician: Sandi Mariscal ? ?Patient Status:  [facility] OP TBA ? ?Chief Complaint: ? ?Right lobe liver lesion concerning for hepatocellular carcinoma ? ?Subjective: ?Patient known to IR service from consultation with Dr. Pascal Lux on 05/18/2021 to discuss treatment options for 2.7 cm lesion within the dome of the anterior segment of the right lobe of the liver concerning for hepatocellular carcinoma. He is a 71 year old Steven [name] with past medical history significant for hepatitis C treated with Harvoni in 2018, alcoholism, hypertension, skin cancer, AAA, cholelithiasis, and substance/tobacco abuse.  Following discussions with Dr. Pascal Lux patient was deemed an appropriate candidate for image guided microwave ablation with intraprocedural angiogram and bland embolization of the solitary liver lesion.  He presents today for the procedure.  He currently denies fever, headache, chest pain, dyspnea, cough, abdominal/back pain, nausea, vomiting or bleeding. ? ?Past Medical History:  ?Diagnosis Date  ? Cancer [facility])   ? skin adn liver  ? EtOH dependence ([place])   ? Hepatitis C   ? successfully eradicated with Harvoni in 2018 with SVR documented  ? Hypertension   ? Shoulder impingement 2004  ? ?Past Surgical History:  ?Procedure Laterality Date  ? COLONOSCOPY WITH PROPOFOL N/A 05/03/2021  ? Procedure: COLONOSCOPY WITH PROPOFOL;  Surgeon: Eloise Harman, DO;  Location: [facility] ENDO SUITE;  Service: Endoscopy;  Laterality: N/A;  7:30 / ASA III  ? FRACTURE SURGERY Left   ? wrist  ? IR RADIOLOGIST EVAL & MGMT  05/18/2021  ? POLYPECTOMY  05/03/2021  ? Procedure: POLYPECTOMY;  Surgeon: Eloise Harman, DO;  Location: [facility] ENDO SUITE;  Service: Endoscopy;;  ascending,descending  ? SHOULDER ACROMIOPLASTY Right 11/20/2012  ? Procedure: SHOULDER ACROMIOPLASTY;  Surgeon: Sanjuana Kava, MD;  Location: [facility] ORS;  Service: Orthopedics;  Laterality: Right;  ? SKIN CANCER EXCISION Left 2013  ? wrist surgery fell  off a roof  ? STOMACH SURGERY    ? had bleeding ulcers repaired. age 23  ? TOTAL KNEE ARTHROPLASTY Right 11/04/2015  ? Procedure: RIGHT TOTAL KNEE ARTHROPLASTY;  Surgeon: Carole Civil, MD;  Location: [facility] ORS;  Service: Orthopedics;  Laterality: Right;  ? ? ? ? ?Allergies: ?Patient has no known allergies. ? ?Medications: ?Prior to Admission medications   ?Medication Sig Start Date End Date Taking? Authorizing Provider  ?aspirin-acetaminophen-caffeine (EXCEDRIN MIGRAINE) 250-250-65 MG tablet Take 1 tablet by mouth every 6 (six) hours as needed for headache or migraine.   Yes [provider]  ?buPROPion (WELLBUTRIN XL) 300 MG 24 hr tablet Take 300 mg by mouth every morning. 09/18/20  Yes [provider]  ?cholecalciferol (VITAMIN D3) 25 MCG (1000 UNIT) tablet Take 1,000 Units by mouth daily.   Yes [provider]  ?cloNIDine (CATAPRES) 0.1 MG tablet Take 0.1 mg by mouth at bedtime. 07/31/16  Yes [provider]  ?esomeprazole (NEXIUM) 20 MG capsule Take 20 mg by mouth daily as needed (acid reflux).   Yes [provider]  ?furosemide (LASIX) 20 MG tablet Take 10 mg by mouth 2 (two) times daily. 08/04/20  Yes [provider]  ?gabapentin (NEURONTIN) 600 MG tablet Take 600 mg by mouth 3 (three) times daily. 09/18/20  Yes [provider]  ?lisinopril (ZESTRIL) 5 MG tablet Take 5 mg by mouth daily. 08/04/20  Yes [provider]  ?SUBOXONE 8-2 MG FILM Take 1 Film by mouth daily. 07/25/16  Yes [provider]  ?triamcinolone cream (KENALOG) 0.1 % Apply 1 application topically daily as needed (rash/itching).  Yes [provider]  ?VELTASSA 8.4 g packet Take 8.4 g by mouth 2 (two) times a week. Pt states at preop he takes it 2 times per week not on a regular schedule. 03/29/21  Yes [provider]  ? ? ? ?Vital Signs: ?Ht '5\' 10"'$  (1.778 m)   Wt 160 lb 0.9 oz (72.6 kg)   BMI 22.97 kg/m?  ? ?Physical Exam awake, alert.  Chest with  distant but clear breath sounds bilaterally.  Heart with regular rate and rhythm.  Abdomen soft, positive bowel sounds, nontender. No  lower extremity edema. ? ?Imaging: ?No results found. ? ?Labs: ? ?CBC: ?Recent Labs  ?  04/27/21 ?0950 08/05/21 ?1310  ?WBC 8.3 7.0  ?HGB 13.5 14.1  ?HCT 41.3 42.3  ?PLT 276 247  ? ? ?COAGS: ?Recent Labs  ?  01/28/21 ?1435 04/27/21 ?0950 08/05/21 ?1310  ?INR 1.0 0.9 1.1  ? ? ?BMP: ?Recent Labs  ?  04/27/21 ?0950 06/24/21 ?1456 08/05/21 ?1310 08/18/21 ?0718  ?NA 137  --  137 137  ?K 5.0  --  4.9 4.9  ?CL 104  --  102 103  ?CO2 25  --  28 25  ?GLUCOSE 115*  --  104* 107*  ?BUN 25*  --  Steven* 30*  ?CALCIUM 9.2  --  9.3 9.1  ?CREATININE 1.52* 2.00* 1.72* 1.51*  ?GFRNONAA 50*  --  42* 50*  ? ? ?LIVER FUNCTION TESTS: ?Recent Labs  ?  04/27/21 ?0950 08/05/21 ?1310  ?BILITOT 0.4 0.4  ?AST 16 19  ?ALT 15 18  ?ALKPHOS 71 62  ?PROT 7.2 7.8  ?ALBUMIN 4.4 4.6  ? ? ?Assessment and Plan: ?Patient known to IR service from consultation with Dr. Pascal Lux on 05/18/2021 to discuss treatment options for 2.7 cm lesion within the dome of the anterior segment of the right lobe of the liver concerning for hepatocellular carcinoma. He is a 71 year old Steven [name] with past medical history significant for hepatitis C treated with Harvoni in 2018, alcoholism, mild renal insufficiency, hypertension, skin cancer, AAA, cholelithiasis, and substance/tobacco abuse.  Following discussions with Dr. Pascal Lux patient was deemed an appropriate candidate for image guided microwave ablation with intraprocedural hepatic  angiogram and bland embolization of the solitary liver lesion.  He presents today for the procedure. Risks and benefits of procedure were discussed with the patient including, but not limited to bleeding, infection, vascular injury or contrast induced renal failure. ? ?This interventional procedure involves the use of X-rays and because of the nature of the planned procedure, it is possible that we will have prolonged use  of X-ray fluoroscopy. ? ?Potential radiation risks to you include (but are not limited to) the following: ?- A slightly elevated risk for cancer  several years later in life. This risk is typically less ?than 0.5% percent. This risk is low in comparison to the normal incidence of human ?cancer, which is 33% for women and 50% for men according to the American Cancer ?Society. ?- Radiation induced injury can include skin redness, resembling a rash, tissue breakdown / ulcers and hair loss (which can be temporary or permanent).  ? ?The likelihood of either of these occurring depends on the difficulty of the procedure and whether you are sensitive to radiation due to previous procedures, disease, or genetic conditions.  ? ?IF your procedure requires a prolonged use of radiation, you will be notified and given written instructions for further action.  It is your responsibility to monitor the irradiated area for the 2  weeks following the procedure and to notify your physician if you are concerned that you have suffered a radiation induced injury.   ? ?All of the patient's questions were answered, patient is agreeable to proceed. ? ?Consent signed and in chart. ? ?Post procedure patient will be admitted for overnight observation.  creatinine today 1.51. ? ? ? ?Electronically Signed: ?Autumn Messing, PA-C ?08/18/2021, 8:31 AM ? ? ?I spent a total of 25 Minutes at the the patient's bedside AND on the patient's hospital floor or unit, greater than 50% of which was counseling/coordinating care for image guided microwave ablation with intraprocedural hepatic angiogram and bland embolization of solitary liver lesion ? ? ? ? ? ?

## 2021-08-18 NOTE — Procedures (Signed)
Pre-procedure Diagnosis: [place] ?Post-procedure Diagnosis: Same ? ?Post mesenteric arteriogram and bland hepatic embolization and fluoro guided hepatic ablation   ? ?Access: L CFA; Hemostasis achieved with successful deployment of a CELT closure device.  ? ?Complications: None Immediate ?EBL: None ? ?Keep left leg straight for 2 hrs (until 1400) ? ?Signed: ?Sandi Mariscal ?Pager: [phone] ?08/18/2021, 12:18 PM ?  ?

## 2021-08-18 NOTE — Anesthesia Procedure Notes (Signed)
Procedure Name: Intubation ?Date/Time: 08/18/2021 9:06 AM ?Performed by: Lollie Sails, CRNA ?Pre-anesthesia Checklist: Patient identified, Emergency Drugs available, Suction available, Patient being monitored and Timeout performed ?Patient Re-evaluated:Patient Re-evaluated prior to induction ?Oxygen Delivery Method: Circle system utilized ?Preoxygenation: Pre-oxygenation with 100% oxygen ?Induction Type: IV induction ?Ventilation: Mask ventilation without difficulty ?Laryngoscope Size: Sabra Heck and 3 ?Grade View: Grade I ?Tube type: Oral ?Tube size: 8.0 mm ?Number of attempts: 1 ?Airway Equipment and Method: Stylet ?Placement Confirmation: ETT inserted through vocal cords under direct vision, positive ETCO2 and breath sounds checked- equal and bilateral ?Secured at: 24 cm ?Tube secured with: Tape ?Dental Injury: Teeth and Oropharynx as per pre-operative assessment  ? ? ? ? ?

## 2021-08-18 NOTE — Anesthesia Postprocedure Evaluation (Signed)
Anesthesia Post Note ? ?Patient: Steven [name] ? ?Procedure(s) Performed: MICROWAVE  ABLATION ? ?  ? ?Patient location during evaluation: PACU ?Anesthesia Type: General ?Level of consciousness: sedated ?Pain management: pain level controlled ?Vital Signs Assessment: post-procedure vital signs reviewed and stable ?Respiratory status: spontaneous breathing and respiratory function stable ?Cardiovascular status: stable ?Postop Assessment: no apparent nausea or vomiting ?Anesthetic complications: no ? ? ?No notable events documented. ? ?Last Vitals:  ?Vitals:  ? 08/18/21 1245 08/18/21 1250  ?BP: (!) 166/73   ?Pulse: 71 71  ?Resp: 15 16  ?Temp: 36.7 ?C (!) 36.4 ?C  ?SpO2: 100% 100%  ?  ?Last Pain:  ?Vitals:  ? 08/18/21 1250  ?PainSc: 8   ? ? ?  ?  ?  ?  ?  ?  ? ?Azalya Galyon DANIEL ? ? ? ? ?

## 2021-08-18 NOTE — Progress Notes (Signed)
Patient ID: Steven [name], male   DOB: 1950/07/06, 71 y.o.   MRN: [id number] ?Pt c/o rt shoulder/RUQ pain ; denies fever, chills, worsening dyspnea, N/V; afebrile, on dilaudid PCA; puncture sites RUQ abd/left CFA soft, clean/dry, no distinct hematomas; CXR this afternoon- small left pleural eff, no ptx; will give one time dose IV toradol; Dr. Pascal Lux updated ?

## 2021-08-18 NOTE — Transfer of Care (Signed)
Immediate Anesthesia Transfer of Care Note ? ?Patient: Steven [name] ? ?Procedure(s) Performed: MICROWAVE  ABLATION ? ?Patient Location: PACU ? ?Anesthesia Type:General ? ?Level of Consciousness: awake, alert  and oriented ? ?Airway & Oxygen Therapy: Patient Spontanous Breathing and Patient connected to face mask oxygen ? ?Post-op Assessment: Report given to RN and Post -op Vital signs reviewed and stable ? ?Post vital signs: Reviewed and stable ? ?Last Vitals:  ?Vitals Value Taken Time  ?BP 159/66 08/18/21 1200  ?Temp    ?Pulse 63 08/18/21 1202  ?Resp 13 08/18/21 1202  ?SpO2 100 % 08/18/21 1202  ?Vitals shown include unvalidated device data. ? ?Last Pain:  ?Vitals:  ? 08/18/21 0711  ?PainSc: 0-No pain  ?   ? ?  ? ?Complications: No notable events documented. ?

## 2021-08-19 ENCOUNTER — Encounter (HOSPITAL_COMMUNITY): Payer: Self-pay | Admitting: Interventional Radiology

## 2021-08-19 DIAGNOSIS — Z79899 Other long term (current) drug therapy: Secondary | ICD-10-CM | POA: Diagnosis not present

## 2021-08-19 DIAGNOSIS — I10 Essential (primary) hypertension: Secondary | ICD-10-CM | POA: Diagnosis not present

## 2021-08-19 DIAGNOSIS — C220 Liver cell carcinoma: Secondary | ICD-10-CM | POA: Diagnosis not present

## 2021-08-19 DIAGNOSIS — Z7982 Long term (current) use of aspirin: Secondary | ICD-10-CM | POA: Diagnosis not present

## 2021-08-19 DIAGNOSIS — Z87891 Personal history of nicotine dependence: Secondary | ICD-10-CM | POA: Diagnosis not present

## 2021-08-19 DIAGNOSIS — Z85828 Personal history of other malignant neoplasm of skin: Secondary | ICD-10-CM | POA: Diagnosis not present

## 2021-08-19 DIAGNOSIS — Z96651 Presence of right artificial knee joint: Secondary | ICD-10-CM | POA: Diagnosis not present

## 2021-08-19 DIAGNOSIS — J90 Pleural effusion, not elsewhere classified: Secondary | ICD-10-CM | POA: Diagnosis not present

## 2021-08-19 NOTE — Progress Notes (Signed)
Pt had not voided, and has no urge to void. Bladder scan shows 218 mL. ?

## 2021-08-19 NOTE — Discharge Summary (Signed)
? ?Patient ID: ?Steven [name] ?MRN: [id number] ?DOB/AGE: 70-Dec-1952 70 y.o. ? ?Admit date: 08/18/2021 ?Discharge date: 08/19/2021 ? ?Supervising Physician: Sandi Mariscal ? ?Patient Status: [facility] - In-pt ? ?Admission Diagnoses: Right liver lesion concerning for hepatocellular carcinoma ? ?Discharge Diagnoses: Right liver lesion concerning for hepatocellular carcinoma, status post technically successful percutaneous bland embolization and fluoroscopic guided microwave ablation of solitary right-sided liver tumor/hepatocellular carcinoma on 08/18/21 ?Principal Problem: ?  [place] (hepatocellular carcinoma) ([place]) ? ?Past Medical History:  ?Diagnosis Date  ? Cancer [facility])   ? skin adn liver  ? EtOH dependence ([place])   ? Hepatitis C   ? successfully eradicated with Harvoni in 2018 with SVR documented  ? Hypertension   ? Shoulder impingement 2004  ? ?Past Surgical History:  ?Procedure Laterality Date  ? COLONOSCOPY WITH PROPOFOL N/A 05/03/2021  ? Procedure: COLONOSCOPY WITH PROPOFOL;  Surgeon: Eloise Harman, DO;  Location: [facility]O SU[facility];  Service: Endoscopy;  Laterality: N/A;  7:30 / ASA III  ? FRACTURE SURGERY Left   ? wrist  ? IR 3D INDEPENDENT WKST  08/18/2021  ? IR ANGIOGRAM SELECTIVE EACH ADDITIONAL VESSEL  08/18/2021  ? IR ANGIOGRAM SELECTIVE EACH ADDITIONAL VESSEL  08/18/2021  ? IR ANGIOGRAM VISCERAL SELECTIVE  08/18/2021  ? IR EMBO TUMOR ORGAN ISCHEMIA INFARCT INC GUIDE ROADMAPPING  08/18/2021  ? IR RADIOLOGIST EVAL & MGMT  05/18/2021  ? IR US GUIDE TISSUE ABLATION  08/18/2021  ? IR US GUIDE VASC ACCESS LEFT  08/18/2021  ? POLYPECTOMY  05/03/2021  ? Procedure: POLYPECTOMY;  Surgeon: Eloise Harman, DO;  Location: [facility] SU[facility];  Service: Endoscopy;;  ascending,descending  ? RADIOFREQUENCY ABLATION N/A 08/18/2021  ? Procedure: MICROWAVE  ABLATION;  Surgeon: Sandi Mariscal, MD;  Location: [facility] ORS;  Service: Anesthesiology;  Laterality: N/A;  ? SHOULDER ACROMIOPLASTY Right 11/20/2012  ? Procedure: SHOULDER ACROMIOPLASTY;  Surgeon:  Sanjuana Kava, MD;  Location: [facility] ORS;  Service: Orthopedics;  Laterality: Right;  ? SKIN CANCER EXCISION Left 2013  ? wrist surgery fell off a roof  ? STOMACH SURGERY    ? had bleeding ulcers repaired. age 16  ? TOTAL KNEE ARTHROPLASTY Right 11/04/2015  ? Procedure: RIGHT TOTAL KNEE ARTHROPLASTY;  Surgeon: Carole Civil, MD;  Location: [facility] ORS;  Service: Orthopedics;  Laterality: Right;  ? ? ? ?Discharged Condition: good ? ?Hospital Course: Steven [name] is a 71 year old male with history of hepatitis C, alcoholism, hypertension, skin cancer, AAA, cholelithiasis and substance/tobacco abuse.  He also has a history of a 2.7 cm solitary lesion within the dome of the anterior segment of the right lobe of the liver concerning for hepatocellular carcinoma.  He was seen in consultation by Dr. Pascal Lux on 05/18/2021 and deemed an appropriate candidate for image guided microwave ablation with intraprocedural hepatic angiogram and bland embolization of this solitary liver lesion.  He underwent the above procedure on 08/18/2021.  The procedure was performed without immediate complications and he was subsequently admitted to the hospital for overnight observation.  He did experience some right shoulder and right upper quadrant discomfort postprocedure and was placed on Dilaudid PCA pump.  He also had some delay in/incomplete voiding post Foley catheter removal however patient has history of delayed voiding.  On the day of discharge his right shoulder and right upper quadrant pain was diminished, this likely being due to diaphragmatic irritation from recent hepatic ablation.  Chest x-ray showed no pneumothorax.  He denied fever, headache, chest pain, worsening dyspnea, back pain, nausea, vomiting or  bleeding.  He was able to tolerate his diet and ambulate .Findings were discussed with Dr. Pascal Lux and he was deemed stable for discharge at this time.  He will resume his home medications.  He was told to contact our service with any  additional questions or concerns.  Our team will follow up with patient via phone call in a few weeks. ? ?Consults: Anesthesiology ? ?Significant Diagnostic Studies:  ?Results for orders placed or performed during the hospital encounter of 08/18/21  ?Basic metabolic panel  ?Result Value Ref Range  ? Sodium 137 135 - 145 mmol/L  ? Potassium 4.9 3.5 - 5.1 mmol/L  ? Chloride 103 98 - 111 mmol/L  ? CO2 25 22 - 32 mmol/L  ? Glucose, Bld 107 (H) 70 - 99 mg/dL  ? BUN 30 (H) 8 - 23 mg/dL  ? Creatinine, Ser 1.51 (H) 0.61 - 1.24 mg/dL  ? Calcium 9.1 8.9 - 10.3 mg/dL  ? GFR, Estimated 50 (L) >60 mL/min  ? Anion gap 9 5 - 15  ? ? ? ?Treatments:  ? ?Discharge Exam: ?Blood pressure (!) 142/71, pulse 84, temperature 98 ?F (36.7 ?C), temperature source Oral, resp. rate 18, height '5\' 10"'$  (1.778 m), weight 160 lb 0.9 oz (72.6 kg), SpO2 92 %. ?Awake, alert.  Chest with slightly diminished breath sounds left base, right clear; heart with regular rate and rhythm.  Abdomen soft, positive bowel sounds, some mildly tender right upper quadrant tenderness to palpation.  Puncture site left common femoral artery soft, clean, dry, no hematoma, nontender.  No lower extremity edema. ? ?Disposition: Discharge disposition: 01-Home or Self Care ? ? ? ? ? ? ?Discharge Instructions   ? ? Call MD for:  difficulty breathing, headache or visual disturbances   Complete by: As directed ?  ? Call MD for:  extreme fatigue   Complete by: As directed ?  ? Call MD for:  hives   Complete by: As directed ?  ? Call MD for:  persistant dizziness or light-headedness   Complete by: As directed ?  ? Call MD for:  persistant nausea and vomiting   Complete by: As directed ?  ? Call MD for:  redness, tenderness, or signs of infection (pain, swelling, redness, odor or green/yellow discharge around incision site)   Complete by: As directed ?  ? Call MD for:  severe uncontrolled pain   Complete by: As directed ?  ? Call MD for:  temperature >100.4   Complete by: As  directed ?  ? Change dressing (specify)   Complete by: As directed ?  ? May change bandages over right lower chest and left groin daily for the next 2 to 3 days.  May wash sites with soap and water.  ? Diet - low sodium heart healthy   Complete by: As directed ?  ? Discharge instructions   Complete by: As directed ?  ? May resume home medications, stay well-hydrated, avoid strenuous activity for the next 3 to 4 days  ? Driving Restrictions   Complete by: As directed ?  ? No driving for the next 24 hours.  ? Increase activity slowly   Complete by: As directed ?  ? Lifting restrictions   Complete by: As directed ?  ? No heavy lifting for the next 3 to 4 days  ? May shower / Bathe   Complete by: As directed ?  ? May walk up steps   Complete by: As directed ?  ? ?  ? ?  Allergies as of 08/19/2021   ?No Known Allergies ?  ? ?  ?Medication List  ?  ? ?TAKE these medications   ? ?aspirin-acetaminophen-caffeine 250-250-65 MG tablet ?Commonly known as: [place] ?Take 1 tablet by mouth every 6 (six) hours as needed for headache or migraine. ?  ?buPROPion 300 MG 24 hr tablet ?Commonly known as: WELLBUTRIN XL ?Take 300 mg by mouth every morning. ?  ?cholecalciferol 25 MCG (1000 UNIT) tablet ?Commonly known as: VITAMIN D3 ?Take 1,000 Units by mouth daily. ?  ?cloNIDine 0.1 MG tablet ?Commonly known as: CATAPRES ?Take 0.1 mg by mouth at bedtime. ?  ?esomeprazole 20 MG capsule ?Commonly known as: Needles ?Take 20 mg by mouth daily as needed (acid reflux). ?  ?furosemide 20 MG tablet ?Commonly known as: LASIX ?Take 10 mg by mouth 2 (two) times daily. ?  ?gabapentin 600 MG tablet ?Commonly known as: NEURONTIN ?Take 600 mg by mouth 3 (three) times daily. ?  ?lisinopril 5 MG tablet ?Commonly known as: ZESTRIL ?Take 5 mg by mouth daily. ?  ?Suboxone 8-2 MG Film ?Generic drug: Buprenorphine HCl-Naloxone HCl ?Take 1 Film by mouth daily. ?  ?triamcinolone cream 0.1 % ?Commonly known as: KENALOG ?Apply 1 application topically daily as  needed (rash/itching). ?  ?Veltassa 8.4 g packet ?Generic drug: patiromer ?Take 8.4 g by mouth 2 (two) times a week. Pt states at preop he takes it 2 times per week not on a regular schedule. ?  ? ?  ? ?  ?  ? ?

## 2021-08-19 NOTE — Discharge Instructions (Signed)
Stay well-hydrated, may resume home medications, avoid strenuous activity for the next 3 to 4 days ?

## 2021-08-24 DIAGNOSIS — N1832 Chronic kidney disease, stage 3b: Secondary | ICD-10-CM | POA: Diagnosis not present

## 2021-08-24 DIAGNOSIS — I129 Hypertensive chronic kidney disease with stage 1 through stage 4 chronic kidney disease, or unspecified chronic kidney disease: Secondary | ICD-10-CM | POA: Diagnosis not present

## 2021-08-24 DIAGNOSIS — E559 Vitamin D deficiency, unspecified: Secondary | ICD-10-CM | POA: Diagnosis not present

## 2021-08-24 DIAGNOSIS — D638 Anemia in other chronic diseases classified elsewhere: Secondary | ICD-10-CM | POA: Diagnosis not present

## 2021-08-25 DIAGNOSIS — M545 Low back pain, unspecified: Secondary | ICD-10-CM | POA: Diagnosis not present

## 2021-08-25 DIAGNOSIS — G894 Chronic pain syndrome: Secondary | ICD-10-CM | POA: Diagnosis not present

## 2021-08-25 DIAGNOSIS — Z79891 Long term (current) use of opiate analgesic: Secondary | ICD-10-CM | POA: Diagnosis not present

## 2021-08-31 ENCOUNTER — Encounter (HOSPITAL_COMMUNITY): Payer: Self-pay

## 2021-08-31 ENCOUNTER — Other Ambulatory Visit: Payer: Self-pay

## 2021-08-31 ENCOUNTER — Inpatient Hospital Stay (HOSPITAL_COMMUNITY)
Admission: EM | Admit: 2021-08-31 | Discharge: 2021-09-06 | DRG: 871 | Disposition: A | Discharge status: Home / Self Care | Payer: Medicare Other | Attending: Family Medicine | Admitting: Family Medicine

## 2021-08-31 ENCOUNTER — Emergency Department (HOSPITAL_COMMUNITY): Payer: Medicare Other

## 2021-08-31 DIAGNOSIS — R599 Enlarged lymph nodes, unspecified: Secondary | ICD-10-CM | POA: Diagnosis not present

## 2021-08-31 DIAGNOSIS — F17290 Nicotine dependence, other tobacco product, uncomplicated: Secondary | ICD-10-CM | POA: Diagnosis not present

## 2021-08-31 DIAGNOSIS — R109 Unspecified abdominal pain: Secondary | ICD-10-CM | POA: Diagnosis not present

## 2021-08-31 DIAGNOSIS — R7881 Bacteremia: Secondary | ICD-10-CM

## 2021-08-31 DIAGNOSIS — I7143 Infrarenal abdominal aortic aneurysm, without rupture: Secondary | ICD-10-CM | POA: Diagnosis not present

## 2021-08-31 DIAGNOSIS — K750 Abscess of liver: Secondary | ICD-10-CM | POA: Diagnosis present

## 2021-08-31 DIAGNOSIS — A419 Sepsis, unspecified organism: Secondary | ICD-10-CM | POA: Diagnosis not present

## 2021-08-31 DIAGNOSIS — F1020 Alcohol dependence, uncomplicated: Secondary | ICD-10-CM | POA: Diagnosis present

## 2021-08-31 DIAGNOSIS — R6521 Severe sepsis with septic shock: Secondary | ICD-10-CM | POA: Diagnosis present

## 2021-08-31 DIAGNOSIS — E869 Volume depletion, unspecified: Secondary | ICD-10-CM | POA: Diagnosis not present

## 2021-08-31 DIAGNOSIS — Z743 Need for continuous supervision: Secondary | ICD-10-CM | POA: Diagnosis not present

## 2021-08-31 DIAGNOSIS — R110 Nausea: Secondary | ICD-10-CM | POA: Diagnosis not present

## 2021-08-31 DIAGNOSIS — R6520 Severe sepsis without septic shock: Secondary | ICD-10-CM | POA: Diagnosis not present

## 2021-08-31 DIAGNOSIS — R6889 Other general symptoms and signs: Secondary | ICD-10-CM | POA: Diagnosis not present

## 2021-08-31 DIAGNOSIS — I499 Cardiac arrhythmia, unspecified: Secondary | ICD-10-CM | POA: Diagnosis not present

## 2021-08-31 DIAGNOSIS — Z85828 Personal history of other malignant neoplasm of skin: Secondary | ICD-10-CM

## 2021-08-31 DIAGNOSIS — C220 Liver cell carcinoma: Secondary | ICD-10-CM | POA: Diagnosis not present

## 2021-08-31 DIAGNOSIS — E872 Acidosis, unspecified: Secondary | ICD-10-CM | POA: Diagnosis present

## 2021-08-31 DIAGNOSIS — K7200 Acute and subacute hepatic failure without coma: Secondary | ICD-10-CM | POA: Diagnosis not present

## 2021-08-31 DIAGNOSIS — R531 Weakness: Secondary | ICD-10-CM | POA: Diagnosis not present

## 2021-08-31 DIAGNOSIS — R1010 Upper abdominal pain, unspecified: Secondary | ICD-10-CM | POA: Diagnosis not present

## 2021-08-31 DIAGNOSIS — I10 Essential (primary) hypertension: Secondary | ICD-10-CM | POA: Diagnosis present

## 2021-08-31 DIAGNOSIS — J439 Emphysema, unspecified: Secondary | ICD-10-CM | POA: Diagnosis present

## 2021-08-31 DIAGNOSIS — Z96651 Presence of right artificial knee joint: Secondary | ICD-10-CM | POA: Diagnosis present

## 2021-08-31 DIAGNOSIS — F17210 Nicotine dependence, cigarettes, uncomplicated: Secondary | ICD-10-CM | POA: Diagnosis present

## 2021-08-31 DIAGNOSIS — K8020 Calculus of gallbladder without cholecystitis without obstruction: Secondary | ICD-10-CM | POA: Diagnosis not present

## 2021-08-31 DIAGNOSIS — Z79899 Other long term (current) drug therapy: Secondary | ICD-10-CM

## 2021-08-31 DIAGNOSIS — Z20822 Contact with and (suspected) exposure to covid-19: Secondary | ICD-10-CM | POA: Diagnosis present

## 2021-08-31 DIAGNOSIS — J849 Interstitial pulmonary disease, unspecified: Secondary | ICD-10-CM | POA: Diagnosis not present

## 2021-08-31 DIAGNOSIS — G9341 Metabolic encephalopathy: Secondary | ICD-10-CM | POA: Diagnosis present

## 2021-08-31 DIAGNOSIS — D72829 Elevated white blood cell count, unspecified: Secondary | ICD-10-CM

## 2021-08-31 DIAGNOSIS — F1120 Opioid dependence, uncomplicated: Secondary | ICD-10-CM | POA: Diagnosis present

## 2021-08-31 DIAGNOSIS — A4151 Sepsis due to Escherichia coli [E. coli]: Principal | ICD-10-CM | POA: Diagnosis present

## 2021-08-31 DIAGNOSIS — R197 Diarrhea, unspecified: Secondary | ICD-10-CM | POA: Diagnosis not present

## 2021-08-31 DIAGNOSIS — N179 Acute kidney failure, unspecified: Secondary | ICD-10-CM | POA: Diagnosis present

## 2021-08-31 DIAGNOSIS — R509 Fever, unspecified: Secondary | ICD-10-CM | POA: Diagnosis not present

## 2021-08-31 LAB — BASIC METABOLIC PANEL
Anion gap: 9 (ref 5–15)
BUN: 30 mg/dL — ABNORMAL HIGH (ref 8–23)
CO2: 22 mmol/L (ref 22–32)
Calcium: 7.7 mg/dL — ABNORMAL LOW (ref 8.9–10.3)
Chloride: 103 mmol/L (ref 98–111)
Creatinine, Ser: 2.03 mg/dL — ABNORMAL HIGH (ref 0.61–1.24)
GFR, Estimated: 35 mL/min — ABNORMAL LOW (ref >60)
Glucose, Bld: 132 mg/dL — ABNORMAL HIGH (ref 70–99)
Potassium: 3.8 mmol/L (ref 3.5–5.1)
Sodium: 134 mmol/L — ABNORMAL LOW (ref 135–145)

## 2021-08-31 LAB — CBC WITH DIFFERENTIAL/PLATELET
Band Neutrophils: 10 %
Basophils Absolute: 0 10*3/uL (ref 0.0–0.1)
Basophils Relative: 0 %
Eosinophils Absolute: 0.2 10*3/uL (ref 0.0–0.5)
Eosinophils Relative: 1 %
HCT: 35.5 % — ABNORMAL LOW (ref 39.0–52.0)
Hemoglobin: 11.6 g/dL — ABNORMAL LOW (ref 13.0–17.0)
Lymphocytes Relative: 0 %
Lymphs Abs: 0 10*3/uL — ABNORMAL LOW (ref 0.7–4.0)
MCH: 30.7 pg (ref 26.0–34.0)
MCHC: 32.7 g/dL (ref 30.0–36.0)
MCV: 93.9 fL (ref 80.0–100.0)
Monocytes Absolute: 0.2 10*3/uL (ref 0.1–1.0)
Monocytes Relative: 1 %
Neutro Abs: 16.7 10*3/uL — ABNORMAL HIGH (ref 1.7–7.7)
Neutrophils Relative %: 88 %
Platelets: 274 10*3/uL (ref 150–400)
RBC: 3.78 MIL/uL — ABNORMAL LOW (ref 4.22–5.81)
RDW: 12.8 % (ref 11.5–15.5)
WBC: 17 10*3/uL — ABNORMAL HIGH (ref 4.0–10.5)
nRBC: 0 % (ref 0.0–0.2)

## 2021-08-31 LAB — COMPREHENSIVE METABOLIC PANEL
ALT: 165 U/L — ABNORMAL HIGH (ref 0–44)
AST: 241 U/L — ABNORMAL HIGH (ref 15–41)
Albumin: 3.1 g/dL — ABNORMAL LOW (ref 3.5–5.0)
Alkaline Phosphatase: 98 U/L (ref 38–126)
Anion gap: 13 (ref 5–15)
BUN: 32 mg/dL — ABNORMAL HIGH (ref 8–23)
CO2: 27 mmol/L (ref 22–32)
Calcium: 8.5 mg/dL — ABNORMAL LOW (ref 8.9–10.3)
Chloride: 96 mmol/L — ABNORMAL LOW (ref 98–111)
Creatinine, Ser: 2.47 mg/dL — ABNORMAL HIGH (ref 0.61–1.24)
GFR, Estimated: 28 mL/min — ABNORMAL LOW (ref >60)
Glucose, Bld: 106 mg/dL — ABNORMAL HIGH (ref 70–99)
Potassium: 3.9 mmol/L (ref 3.5–5.1)
Sodium: 136 mmol/L (ref 135–145)
Total Bilirubin: 2.2 mg/dL — ABNORMAL HIGH (ref 0.3–1.2)
Total Protein: 6.8 g/dL (ref 6.5–8.1)

## 2021-08-31 LAB — CBC
HCT: 30.6 % — ABNORMAL LOW (ref 39.0–52.0)
Hemoglobin: 10.3 g/dL — ABNORMAL LOW (ref 13.0–17.0)
MCH: 31.7 pg (ref 26.0–34.0)
MCHC: 33.7 g/dL (ref 30.0–36.0)
MCV: 94.2 fL (ref 80.0–100.0)
Platelets: 292 10*3/uL (ref 150–400)
RBC: 3.25 MIL/uL — ABNORMAL LOW (ref 4.22–5.81)
RDW: 13.1 % (ref 11.5–15.5)
WBC: 36.5 10*3/uL — ABNORMAL HIGH (ref 4.0–10.5)
nRBC: 0 % (ref 0.0–0.2)

## 2021-08-31 LAB — RAPID URINE DRUG SCREEN, HOSP PERFORMED
Amphetamines: NOT DETECTED
Barbiturates: NOT DETECTED
Benzodiazepines: NOT DETECTED
Cocaine: NOT DETECTED
Opiates: NOT DETECTED
Tetrahydrocannabinol: POSITIVE — AB

## 2021-08-31 LAB — URINALYSIS, ROUTINE W REFLEX MICROSCOPIC
Bilirubin Urine: NEGATIVE
Glucose, UA: NEGATIVE mg/dL
Ketones, ur: NEGATIVE mg/dL
Leukocytes,Ua: NEGATIVE
Nitrite: NEGATIVE
Protein, ur: 100 mg/dL — AB
Specific Gravity, Urine: 1.018 (ref 1.005–1.030)
pH: 7 (ref 5.0–8.0)

## 2021-08-31 LAB — ETHANOL: Alcohol, Ethyl (B): <10 mg/dL (ref <10)

## 2021-08-31 LAB — RESP PANEL BY RT-PCR (FLU A&B, COVID) ARPGX2
Influenza A by PCR: NEGATIVE
Influenza B by PCR: NEGATIVE
SARS Coronavirus 2 by RT PCR: NEGATIVE

## 2021-08-31 LAB — LACTIC ACID, PLASMA
Lactic Acid, Venous: 2.6 mmol/L (ref 0.5–1.9)
Lactic Acid, Venous: 2.5 mmol/L (ref 0.5–1.9)
Lactic Acid, Venous: 1.8 mmol/L (ref 0.5–1.9)

## 2021-08-31 LAB — APTT: aPTT: 32 seconds (ref 24–36)

## 2021-08-31 LAB — AMMONIA: Ammonia: 12 umol/L (ref 9–35)

## 2021-08-31 LAB — PROTIME-INR
INR: 1.2 (ref 0.8–1.2)
Prothrombin Time: 14.9 seconds (ref 11.4–15.2)

## 2021-08-31 LAB — MRSA NEXT GEN BY PCR, NASAL: MRSA by PCR Next Gen: NOT DETECTED

## 2021-08-31 LAB — CK: Total CK: 226 U/L (ref 49–397)

## 2021-08-31 LAB — TROPONIN I (HIGH SENSITIVITY)
Troponin I (High Sensitivity): 90 ng/L — ABNORMAL HIGH (ref <18)
Troponin I (High Sensitivity): 68 ng/L — ABNORMAL HIGH (ref <18)

## 2021-08-31 MED ORDER — SODIUM CHLORIDE 0.9 % IV SOLN
2.0000 g | Freq: Once | INTRAVENOUS | Status: AC
  Administered 2021-08-31: 2 g via INTRAVENOUS
  Filled 2021-08-31: qty 2

## 2021-08-31 MED ORDER — SODIUM CHLORIDE 0.9 % IV BOLUS
1000.0000 mL | Freq: Once | INTRAVENOUS | Status: AC
  Administered 2021-08-31: 1000 mL via INTRAVENOUS

## 2021-08-31 MED ORDER — LACTATED RINGERS IV SOLN: INTRAVENOUS | Status: AC

## 2021-08-31 MED ORDER — CHLORHEXIDINE GLUCONATE CLOTH 2 % EX PADS
6.0000 | MEDICATED_PAD | Freq: Every day | CUTANEOUS | Status: DC
  Administered 2021-09-01: 6 via TOPICAL

## 2021-08-31 MED ORDER — VANCOMYCIN HCL 750 MG/150ML IV SOLN: 750.0000 mg | INTRAVENOUS | Status: DC

## 2021-08-31 MED ORDER — METRONIDAZOLE 500 MG/100ML IV SOLN
500.0000 mg | Freq: Once | INTRAVENOUS | Status: AC
  Administered 2021-08-31: 500 mg via INTRAVENOUS
  Filled 2021-08-31: qty 100

## 2021-08-31 MED ORDER — NOREPINEPHRINE 4 MG/250ML-% IV SOLN
0.0000 ug/min | INTRAVENOUS | Status: DC
  Administered 2021-08-31: 17 ug/min via INTRAVENOUS
  Administered 2021-08-31: 10 ug/min via INTRAVENOUS
  Filled 2021-08-31 (×2): qty 250

## 2021-08-31 MED ORDER — VANCOMYCIN HCL IN DEXTROSE 1-5 GM/200ML-% IV SOLN
1000.0000 mg | Freq: Once | INTRAVENOUS | Status: DC
  Administered 2021-08-31: 1000 mg via INTRAVENOUS
  Filled 2021-08-31: qty 200

## 2021-08-31 MED ORDER — SODIUM CHLORIDE 0.9 % IV SOLN: 2.0000 g | Freq: Two times a day (BID) | INTRAVENOUS | Status: DC

## 2021-08-31 MED ORDER — VANCOMYCIN HCL 1500 MG/300ML IV SOLN: 1500.0000 mg | INTRAVENOUS | Status: DC

## 2021-08-31 NOTE — ED Notes (Signed)
Male wik was placed '@1300'$  by this nt ? ?

## 2021-08-31 NOTE — ED Triage Notes (Signed)
Patient via EMS for weakness that started this morning. Negative stroke screen per EMS. ?

## 2021-08-31 NOTE — Progress Notes (Signed)
eLink Physician-Brief Progress Note ?Patient Name: Steven [name] ?DOB: 1951/04/13 ?MRN: [id number] ? ? ?Date of Service ? 08/31/2021  ?HPI/Events of Note ? Patient admitted with altered mental status, r/o sepsis with as yet undetermined focus, acute kidney injury, and metastatic liver malignancy s/p ablation, with abnormal LFT's currently, work up is in progress.  ?eICU Interventions ? New Patient Evaluation.  ? ? ? ?  ? ?[name] ?08/31/2021, 10:54 PM ?

## 2021-08-31 NOTE — ED Notes (Signed)
Pt presented to er by ems, pt was found cover head to toe in stool. Pt was cleaned up, gowned and hooked up on the monitors. Pts rectal temp was took, 103.1 RN notified ?

## 2021-08-31 NOTE — ED Notes (Signed)
IV team at bedside to do ultrasound guided IV for levophed to be switched from peripheral site. BP 100/52. Patient denies pain or discomfort. Oriented x 4. Levophed continues at 71mg.  ?

## 2021-08-31 NOTE — ED Provider Notes (Signed)
As well as ?[facility] ?Provider Note ? ? ?CSN: [id number] ?Arrival date & time: 08/31/21  1233 ? ?  ? ?History ? ?Chief Complaint  ?Patient presents with  ? Weakness  ? ? ?Steven [name] is a 71 y.o. male. ? ?HPI ? ?Patient with medical history including alcohol dependency, polysubstance dependency, hep C, cancer, hypertension, presents with with complaints of generalized weakness.  He states that he started feel generalized weakness this morning around 8 AM when he woke up.  States he was unable to get himself out of bed, he states he felt overall weakness was not unilateral not isolated to the upper or lower extremities.  States that he was so weak that he could not get out of bed and defecated on himself.  He states yesterday he was feeling just fine was ambulate without any difficulty.  States he has been tolerating p.o., no chest pain no shortness of breath no general body aches no fevers chills cough or congestion he denies any urinary symptoms.  Denies any recent falls he is not on anticoag's.  He states that he woke up around 3 AM this morning to use the restroom was walking just fine and then he did not wake up again till 8 AM which he knows he was weak. ? ?Roommate Louie Casa states patient has been acting altered from baseline since this Sunday evening. Endorses decreased appetite and patient moving "in slow-motion." This morning, Louie Casa reports patient was hallucinating, actively responding out loud to internal stimuli while walking around the entire house opening doors and drawers. No witnessed drug use, states patient has not consumed alcohol for the past 8 months.  ? ?Reviewed patient's chart was seen by IR for mass which is suspicious for hepatic carcinoma, had ablation performed on 03/15 by Dr. Ulice Dash. [place]. ? ? ? ?Home Medications ?Prior to Admission medications   ?Medication Sig Start Date End Date Taking? Authorizing Provider  ?aspirin-acetaminophen-caffeine (EXCEDRIN MIGRAINE)  250-250-65 MG tablet Take 1 tablet by mouth every 6 (six) hours as needed for headache or migraine.    [provider]  ?buPROPion (WELLBUTRIN XL) 300 MG 24 hr tablet Take 300 mg by mouth every morning. 09/18/20   [provider]  ?cholecalciferol (VITAMIN D3) 25 MCG (1000 UNIT) tablet Take 1,000 Units by mouth daily.    [provider]  ?cloNIDine (CATAPRES) 0.1 MG tablet Take 0.1 mg by mouth at bedtime. 07/31/16   [provider]  ?esomeprazole (NEXIUM) 20 MG capsule Take 20 mg by mouth daily as needed (acid reflux).    [provider]  ?furosemide (LASIX) 20 MG tablet Take 10 mg by mouth 2 (two) times daily. 08/04/20   [provider]  ?gabapentin (NEURONTIN) 600 MG tablet Take 600 mg by mouth 3 (three) times daily. 09/18/20   [provider]  ?lisinopril (ZESTRIL) 5 MG tablet Take 5 mg by mouth daily. 08/04/20   [provider]  ?SUBOXONE 8-2 MG FILM Take 1 Film by mouth daily. 07/25/16   [provider]  ?triamcinolone cream (KENALOG) 0.1 % Apply 1 application topically daily as needed (rash/itching).    [provider]  ?VELTASSA 8.4 g packet Take 8.4 g by mouth 2 (two) times a week. Pt states at preop he takes it 2 times per week not on a regular schedule. 03/29/21   [provider]  ?   ? ?Allergies    ?Patient has no known allergies.   ? ?Review of Systems   ?  Review of Systems  ?Constitutional:  Negative for chills and fever.  ?Respiratory:  Negative for shortness of breath.   ?Cardiovascular:  Negative for chest pain.  ?Gastrointestinal:  Negative for abdominal pain.  ?Neurological:  Positive for weakness. Negative for headaches.  ? ?Physical Exam ?Updated Vital Signs ?BP 132/66   Pulse (!) 101   Temp 100 ?F (37.8 ?C) (Oral)   Resp (!) 24   Ht '5\' 10"'$  (1.778 m)   Wt 74.8 kg   SpO2 92%   BMI 23.68 kg/m?  ?Physical Exam ?Vitals and nursing note reviewed.  ?Constitutional:   ?   General: He is not in acute  distress. ?   Appearance: He is ill-appearing and toxic-appearing.  ?   Comments: Toxic appearing, deconditioned state, disheveled, he was covered in feces on arrival.  ?HENT:  ?   Head: Normocephalic and atraumatic.  ?   Ears:  ?   Comments: No deformity of the head present, no raccoon eyes or battle sign noted. ?   Nose: No congestion.  ?   Mouth/Throat:  ?   Mouth: Mucous membranes are dry.  ?   Pharynx: Oropharynx is clear. No oropharyngeal exudate or posterior oropharyngeal erythema.  ?   Comments: No trismus no torticollis no oral trauma present. ?Eyes:  ?   Extraocular Movements: Extraocular movements intact.  ?   Conjunctiva/sclera: Conjunctivae normal.  ?   Pupils: Pupils are equal, round, and reactive to light.  ?Cardiovascular:  ?   Rate and Rhythm: Normal rate and regular rhythm.  ?   Pulses: Normal pulses.  ?   Heart sounds: No murmur heard. ?  No friction rub. No gallop.  ?Pulmonary:  ?   Effort: No respiratory distress.  ?   Breath sounds: No wheezing, rhonchi or rales.  ?Abdominal:  ?   Palpations: Abdomen is soft.  ?   Tenderness: There is no abdominal tenderness. There is no right CVA tenderness or left CVA tenderness.  ?Musculoskeletal:  ?   Cervical back: No tenderness.  ?   Comments: Spine palpated nontender to palpation no step-off deformities noted.  No overlying skin changes.  No pelvis instability.  No leg shortening.  Moving all 4 extremities.  Neurovascular fully intact.  ?Skin: ?   General: Skin is warm and dry.  ?Neurological:  ?   Mental Status: He is alert.  ?   GCS: GCS eye subscore is 4. GCS verbal subscore is 5. GCS motor subscore is 6.  ?   Cranial Nerves: Cranial nerves 2-12 are intact. No cranial nerve deficit or facial asymmetry.  ?   Sensory: Sensation is intact.  ?   Motor: Weakness present.  ?   Coordination: Finger-Nose-Finger Test and Heel to [place] Test normal.  ?   Comments: Alert and oriented x4.  Cranial nerves II through XII grossly intact, following two-step commands,  no difficulty with word finding, no slurring of his words, no unilateral weakness present.  He does have noted globalized weakness 4-5 strength in the upper lower extremities.  ?Psychiatric:     ?   Mood and Affect: Mood normal.  ? ? ?ED Results / Procedures / Treatments   ?Labs ?(all labs ordered are listed, but only abnormal results are displayed) ?Labs Reviewed  ?COMPREHENSIVE METABOLIC PANEL - Abnormal; Notable for the following components:  ?    Result Value  ? Chloride 96 (*)   ? Glucose, Bld 106 (*)   ? BUN 32 (*)   ?  Creatinine, Ser 2.47 (*)   ? Calcium 8.5 (*)   ? Albumin 3.1 (*)   ? AST 241 (*)   ? ALT 165 (*)   ? Total Bilirubin 2.2 (*)   ? GFR, Estimated 28 (*)   ? All other components within normal limits  ?CBC WITH DIFFERENTIAL/PLATELET - Abnormal; Notable for the following components:  ? WBC 17.0 (*)   ? RBC 3.78 (*)   ? Hemoglobin 11.6 (*)   ? HCT 35.5 (*)   ? Neutro Abs 16.7 (*)   ? Lymphs Abs 0.0 (*)   ? All other components within normal limits  ?LACTIC ACID, PLASMA - Abnormal; Notable for the following components:  ? Lactic Acid, Venous 2.6 (*)   ? All other components within normal limits  ?LACTIC ACID, PLASMA - Abnormal; Notable for the following components:  ? Lactic Acid, Venous 2.5 (*)   ? All other components within normal limits  ?RAPID URINE DRUG SCREEN, HOSP PERFORMED - Abnormal; Notable for the following components:  ? Tetrahydrocannabinol POSITIVE (*)   ? All other components within normal limits  ?URINALYSIS, ROUTINE W REFLEX MICROSCOPIC - Abnormal; Notable for the following components:  ? Color, Urine AMBER (*)   ? Hgb urine dipstick SMALL (*)   ? Protein, ur 100 (*)   ? Bacteria, UA RARE (*)   ? All other components within normal limits  ?TROPONIN I (HIGH SENSITIVITY) - Abnormal; Notable for the following components:  ? Troponin I (High Sensitivity) 90 (*)   ? All other components within normal limits  ?TROPONIN I (HIGH SENSITIVITY) - Abnormal; Notable for the following components:   ? Troponin I (High Sensitivity) 68 (*)   ? All other components within normal limits  ?RESP PANEL BY RT-PCR (FLU A&B, COVID) ARPGX2  ?CULTURE, BLOOD (ROUTINE X 2)  ?CULTURE, BLOOD (ROUTINE X 2)  ?URINE CULTURE  ?CK  ?

## 2021-08-31 NOTE — ED Notes (Signed)
MD informed of drop in blood pressure. MD at bedside.  Fluid bolus infusing. Patient in trendelenburg. No complaints of shortness of breath. Alert and oriented x 4.  ?

## 2021-08-31 NOTE — ED Notes (Signed)
Returned from [place]. No complaints. BP 98/58. Levophed continues at '10mg'$ .  ?

## 2021-08-31 NOTE — ED Notes (Signed)
Resting quietly with eyes closed. Denies pain. Lethargic but easily aroused to voice. Skin pale and dry.  ?

## 2021-08-31 NOTE — ED Notes (Signed)
BP decreased to 76 systolic. Levophed gtt increased to 42mg. Patient denies pain or discomfort. Skin warm and dry. No complaints of pain or shortness of breath.  ?

## 2021-08-31 NOTE — ED Notes (Signed)
243m of dark tea colored urine out in foley. Specimen collected and sent to lab. Foley patent and draining.  ?

## 2021-08-31 NOTE — Progress Notes (Addendum)
Pharmacy Antibiotic Note ? ?Steven [name] is a 71 y.o. male admitted on 08/31/2021 with presumed sepsis.  Pharmacy has been consulted for vancomycin and cefepime dosing. ? ?Plan: ?Cefepime 2gm IV q12h ?Vancomycin '1500mg'$  IV load, then '750mg'$  IV q 24h ?[place] renal function, cultures, and clinical status ? ?Height: '5\' 10"'$  (177.8 cm) ?Weight: 74.8 kg (165 lb) ?IBW/kg (Calculated) : 73 ?Calc AUC = 487, Vd 54, ke 0.029 ? ?Temp (24hrs), Avg:100.5 ?F (38.1 ?C), Min:97.9 ?F (36.6 ?C), Max:103.1 ?F (39.5 ?C) ? ?Recent Labs  ?Lab 08/31/21 ?1343  ?WBC 17.0*  ?CREATININE 2.47*  ?LATICACIDVEN 2.6*  ?  ?Estimated Creatinine Clearance: 29.1 mL/min (A) (by C-G formula based on SCr of 2.47 mg/dL (H)).   ? ?No Known Allergies ? ?Antimicrobials this admission: ?3/28 vanc ?3/28 cefepime ?3/28 flagl x1 ? ?Microbiology results: ?3/28 bld cx P ? ?Thank you for allowing pharmacy to be a part of this patient?s care. ? ?Lorenso Courier, PharmD ?Clinical Pharmacist ?08/31/2021 2:34 PM ? ? ? ? ? ? ?

## 2021-09-01 ENCOUNTER — Inpatient Hospital Stay (HOSPITAL_COMMUNITY): Payer: Medicare Other

## 2021-09-01 DIAGNOSIS — R6520 Severe sepsis without septic shock: Secondary | ICD-10-CM | POA: Diagnosis not present

## 2021-09-01 DIAGNOSIS — A419 Sepsis, unspecified organism: Secondary | ICD-10-CM | POA: Diagnosis not present

## 2021-09-01 LAB — BASIC METABOLIC PANEL
Anion gap: 6 (ref 5–15)
BUN: 31 mg/dL — ABNORMAL HIGH (ref 8–23)
CO2: 24 mmol/L (ref 22–32)
Calcium: 7.8 mg/dL — ABNORMAL LOW (ref 8.9–10.3)
Chloride: 104 mmol/L (ref 98–111)
Creatinine, Ser: 1.94 mg/dL — ABNORMAL HIGH (ref 0.61–1.24)
GFR, Estimated: 37 mL/min — ABNORMAL LOW (ref >60)
Glucose, Bld: 120 mg/dL — ABNORMAL HIGH (ref 70–99)
Potassium: 4.1 mmol/L (ref 3.5–5.1)
Sodium: 134 mmol/L — ABNORMAL LOW (ref 135–145)

## 2021-09-01 LAB — BLOOD CULTURE ID PANEL (REFLEXED) - BCID2
A.calcoaceticus-baumannii: NOT DETECTED
Bacteroides fragilis: NOT DETECTED
CTX-M ESBL: NOT DETECTED
Candida albicans: NOT DETECTED
Candida auris: NOT DETECTED
Candida glabrata: NOT DETECTED
Candida krusei: NOT DETECTED
Candida parapsilosis: NOT DETECTED
Candida tropicalis: NOT DETECTED
Carbapenem resist OXA 48 LIKE: NOT DETECTED
Carbapenem resistance IMP: NOT DETECTED
Carbapenem resistance KPC: NOT DETECTED
Carbapenem resistance NDM: NOT DETECTED
Carbapenem resistance VIM: NOT DETECTED
Cryptococcus neoformans/gattii: NOT DETECTED
Enterobacter cloacae complex: DETECTED — AB
Enterobacterales: DETECTED — AB
Enterococcus Faecium: NOT DETECTED
Enterococcus faecalis: NOT DETECTED
Escherichia coli: DETECTED — AB
Haemophilus influenzae: NOT DETECTED
Klebsiella aerogenes: NOT DETECTED
Klebsiella oxytoca: NOT DETECTED
Klebsiella pneumoniae: NOT DETECTED
Listeria monocytogenes: NOT DETECTED
Neisseria meningitidis: NOT DETECTED
Proteus species: NOT DETECTED
Pseudomonas aeruginosa: NOT DETECTED
Salmonella species: NOT DETECTED
Serratia marcescens: NOT DETECTED
Staphylococcus aureus (BCID): NOT DETECTED
Staphylococcus epidermidis: NOT DETECTED
Staphylococcus lugdunensis: NOT DETECTED
Staphylococcus species: NOT DETECTED
Stenotrophomonas maltophilia: NOT DETECTED
Streptococcus agalactiae: NOT DETECTED
Streptococcus pneumoniae: NOT DETECTED
Streptococcus pyogenes: NOT DETECTED
Streptococcus species: NOT DETECTED

## 2021-09-01 LAB — HIV ANTIBODY (ROUTINE TESTING W REFLEX): HIV Screen 4th Generation wRfx: NONREACTIVE

## 2021-09-01 LAB — PROCALCITONIN
Procalcitonin: 72.82 ng/mL
Procalcitonin: 65.87 ng/mL

## 2021-09-01 LAB — CBC
HCT: 30 % — ABNORMAL LOW (ref 39.0–52.0)
Hemoglobin: 10.1 g/dL — ABNORMAL LOW (ref 13.0–17.0)
MCH: 31.6 pg (ref 26.0–34.0)
MCHC: 33.7 g/dL (ref 30.0–36.0)
MCV: 93.8 fL (ref 80.0–100.0)
Platelets: 230 10*3/uL (ref 150–400)
RBC: 3.2 MIL/uL — ABNORMAL LOW (ref 4.22–5.81)
RDW: 13.1 % (ref 11.5–15.5)
WBC: 32.5 10*3/uL — ABNORMAL HIGH (ref 4.0–10.5)
nRBC: 0 % (ref 0.0–0.2)

## 2021-09-01 LAB — PHOSPHORUS: Phosphorus: 3.9 mg/dL (ref 2.5–4.6)

## 2021-09-01 LAB — MAGNESIUM: Magnesium: 1.8 mg/dL (ref 1.7–2.4)

## 2021-09-01 MED ORDER — NOREPINEPHRINE 4 MG/250ML-% IV SOLN: 2.0000 ug/min | INTRAVENOUS | Status: DC

## 2021-09-01 MED ORDER — PIPERACILLIN-TAZOBACTAM 3.375 G IVPB
3.3750 g | Freq: Three times a day (TID) | INTRAVENOUS | Status: DC
Start: 2021-09-01 — End: 2021-09-01
  Administered 2021-09-01 (×2): 3.375 g via INTRAVENOUS
  Filled 2021-09-01 (×2): qty 50

## 2021-09-01 MED ORDER — LACTATED RINGERS IV SOLN: INTRAVENOUS | Status: DC

## 2021-09-01 MED ORDER — DOCUSATE SODIUM 100 MG PO CAPS: 100.0000 mg | ORAL_CAPSULE | Freq: Two times a day (BID) | ORAL | Status: DC | PRN

## 2021-09-01 MED ORDER — ACETAMINOPHEN 325 MG PO TABS
650.0000 mg | ORAL_TABLET | Freq: Four times a day (QID) | ORAL | Status: DC | PRN
  Administered 2021-09-01 – 2021-09-03 (×2): 650 mg via ORAL
  Filled 2021-09-01 (×2): qty 2

## 2021-09-01 MED ORDER — SODIUM CHLORIDE 0.9 % IV SOLN
250.0000 mL | INTRAVENOUS | Status: DC
  Administered 2021-09-01: 250 mL via INTRAVENOUS

## 2021-09-01 MED ORDER — SODIUM CHLORIDE 0.9 % IV SOLN
2.0000 g | INTRAVENOUS | Status: DC
  Filled 2021-09-01: qty 20

## 2021-09-01 MED ORDER — ONDANSETRON HCL 4 MG/2ML IJ SOLN: 4.0000 mg | Freq: Four times a day (QID) | INTRAMUSCULAR | Status: DC | PRN

## 2021-09-01 MED ORDER — POLYETHYLENE GLYCOL 3350 17 G PO PACK: 17.0000 g | PACK | Freq: Every day | ORAL | Status: DC | PRN

## 2021-09-01 MED ORDER — PANTOPRAZOLE SODIUM 40 MG IV SOLR
40.0000 mg | Freq: Every day | INTRAVENOUS | Status: DC
  Administered 2021-09-01 – 2021-09-04 (×5): 40 mg via INTRAVENOUS
  Filled 2021-09-01 (×5): qty 10

## 2021-09-01 MED ORDER — SODIUM CHLORIDE 0.9 % IV SOLN
2.0000 g | Freq: Two times a day (BID) | INTRAVENOUS | Status: DC
  Administered 2021-09-01 – 2021-09-04 (×6): 2 g via INTRAVENOUS
  Filled 2021-09-01 (×6): qty 2

## 2021-09-01 MED ORDER — BUPRENORPHINE HCL-NALOXONE HCL 8-2 MG SL SUBL
1.0000 | SUBLINGUAL_TABLET | Freq: Every day | SUBLINGUAL | Status: DC
  Administered 2021-09-01 – 2021-09-06 (×6): 1 via SUBLINGUAL
  Filled 2021-09-01 (×6): qty 1

## 2021-09-01 NOTE — Progress Notes (Addendum)
PHARMACY - PHYSICIAN COMMUNICATION ?CRITICAL VALUE ALERT - BLOOD CULTURE IDENTIFICATION (BCID) ? ?Steven [name] is an 71 y.o. male who presented to [facility] on 08/31/2021 with a chief complaint of confusion, rigors, n/v/d. Underwent microwave ablation of a solitary right sided liver tumor/hepatocellular carcinoma on 08/18/21.  ? ?Assessment:  septic shock. Blood cultures + for E.coli.  ? ?Addendum: Micro lab did not initially report Enterobacter cloacae complex on BCID as well. Confirmed with Luther Parody in Micro lab that both E.coli and Enterobacter cloacae complex detected.  ? ?Name of physician (or Provider) Contacted: Dr. Lake Bells and Georgann Housekeeper, NP ? ?Current antibiotics: Zosyn ? ?Changes to prescribed antibiotics recommended:  ?Change to Ceftriaxone 2g IV q24h ? ?Addendum: Change to Cefepime 2g IV q12h per updated information above (see Assessment Addendum)  ? ?Results for orders placed or performed during the hospital encounter of 08/31/21  ?Blood Culture ID Panel (Reflexed) (Collected: 08/31/2021  1:43 PM)  ?Result Value Ref Range  ? Enterococcus faecalis NOT DETECTED NOT DETECTED  ? Enterococcus Faecium NOT DETECTED NOT DETECTED  ? Listeria monocytogenes NOT DETECTED NOT DETECTED  ? Staphylococcus species NOT DETECTED NOT DETECTED  ? Staphylococcus aureus (BCID) NOT DETECTED NOT DETECTED  ? Staphylococcus epidermidis NOT DETECTED NOT DETECTED  ? Staphylococcus lugdunensis NOT DETECTED NOT DETECTED  ? Streptococcus species NOT DETECTED NOT DETECTED  ? Streptococcus agalactiae NOT DETECTED NOT DETECTED  ? Streptococcus pneumoniae NOT DETECTED NOT DETECTED  ? Streptococcus pyogenes NOT DETECTED NOT DETECTED  ? A.calcoaceticus-baumannii NOT DETECTED NOT DETECTED  ? Bacteroides fragilis NOT DETECTED NOT DETECTED  ? Enterobacterales DETECTED (A) NOT DETECTED  ? Enterobacter cloacae complex DETECTED (A) NOT DETECTED  ? Escherichia coli DETECTED (A) NOT DETECTED  ? Klebsiella aerogenes NOT DETECTED NOT  DETECTED  ? Klebsiella oxytoca NOT DETECTED NOT DETECTED  ? Klebsiella pneumoniae NOT DETECTED NOT DETECTED  ? Proteus species NOT DETECTED NOT DETECTED  ? Salmonella species NOT DETECTED NOT DETECTED  ? Serratia marcescens NOT DETECTED NOT DETECTED  ? Haemophilus influenzae NOT DETECTED NOT DETECTED  ? Neisseria meningitidis NOT DETECTED NOT DETECTED  ? Pseudomonas aeruginosa NOT DETECTED NOT DETECTED  ? Stenotrophomonas maltophilia NOT DETECTED NOT DETECTED  ? Candida albicans NOT DETECTED NOT DETECTED  ? Candida auris NOT DETECTED NOT DETECTED  ? Candida glabrata NOT DETECTED NOT DETECTED  ? Candida krusei NOT DETECTED NOT DETECTED  ? Candida parapsilosis NOT DETECTED NOT DETECTED  ? Candida tropicalis NOT DETECTED NOT DETECTED  ? Cryptococcus neoformans/gattii NOT DETECTED NOT DETECTED  ? CTX-M ESBL NOT DETECTED NOT DETECTED  ? Carbapenem resistance IMP NOT DETECTED NOT DETECTED  ? Carbapenem resistance KPC NOT DETECTED NOT DETECTED  ? Carbapenem resistance NDM NOT DETECTED NOT DETECTED  ? Carbapenem resist OXA 48 LIKE NOT DETECTED NOT DETECTED  ? Carbapenem resistance VIM NOT DETECTED NOT DETECTED  ? ? ?Lindell Spar M ?09/01/2021  4:04 PM ? ?

## 2021-09-01 NOTE — H&P (Signed)
? ?NAME:  Steven [name], MRN:  [id number], DOB:  November 09, 1950, LOS: 1 ?ADMISSION DATE:  08/31/2021, CONSULTATION DATE: 09/01/2021 ?REFERRING MD: [facility], CHIEF COMPLAINT: Chills and rigors ? ?History of Present Illness:  ?This is a 71 year old white male who presented to [facility] emergency room from home.  On 08/18/2021 the patient underwent guided microwave ablation of a solitary right sided liver tumor/hepatocellular carcinoma.  He states he went home did not have any abnormal symptoms until proxy 1 week ago when he developed these intermittent periods of chills and rigors.  24 hours prior to arriving in the emergency room patient started developing some mild confusion and the rigors became more consistent.  He denies any known fevers.  He did have episode of nausea and vomiting 24 hours ago.  He also developed 4 episodes of profuse diarrhea that he states he was walking around the house after having these episodes and not remembering to clean himself up.  Currently denies any chest pain/chest pressure/shortness of breath/fever/headache/dizziness/change in vision/weakness or paresthesias.  He denies any nausea or vomiting.  Positive diarrhea.  No hematochezia/hemoptysis/hematemesis/hematuria ? ?Pertinent  Medical History  ?Hepatocellular carcinoma of the right upper lobe of the liver ?Hepatitis C that was treated with Harvoni in 2018 ?EtOH abuse history last drink approximately 6 to 12 months ago ?Hypertension ?AAA ?Cholelithiasis ? ?Significant Hospital Events: ?Including procedures, antibiotic start and stop dates in addition to other pertinent events   ? ? ?Objective   ?Blood pressure 110/64, pulse 94, temperature 98.4 ?F (36.9 ?C), temperature source Axillary, resp. rate (!) 21, height '5\' 10"'$  (1.778 m), weight 74.8 kg, SpO2 95 %. ?   ?   ? ?Intake/Output Summary (Last 24 hours) at 09/01/2021 0013 ?Last data filed at 08/31/2021 2353 ?Gross per 24 hour  ?Intake 3504.92 ml  ?Output --  ?Net  3504.92 ml  ? ?Filed Weights  ? 08/31/21 1233  ?Weight: 74.8 kg  ? ? ?Examination: ?General: No acute distress ?HENT: Atraumatic Normocephalic  + icteric sclera mucous membranes are very dry ?Lungs: Clear to ausculation bilateral.  No wheezing rales or rhonchi  ?Cardiovascular: RR no murmur rub or gallop ?Abdomen: Soft Non tender, Non distended,  +bowel sounds  No rebound/rigidity or guarding ?Extremities: No edema or cyanosis.  +clubbing ?Neuro: Awake alert.  Motor intact x4.  Follows commands  ?Skin: +skin tenting  ? ? ?Assessment & Plan:  ?Septic shock ?Possible intrahepatic source following recent microwave ablation of the hepatocellular carcinoma. ?Severe intravascular volume depletion ?Acute kidney injury ?Acute hepatic insufficiency ? ?Plan: ?Patient was admitted to the intensive care unit for further work-up. ? ?Pulmonary: ?Patient is currently on room air.  Oxygen saturations are greater than 95%. ? ?Cardiovascular: ?We will continue to aggressively wean Levophed for MAP over 60.  Patient is significantly improved on dosing since arrival to the ICU. ?Patient needs volume resuscitation with lactated Ringer's. ? ?Renal: ?Monitor I's/O's. ?Replete electrolytes per protocol. ?Continue lactated Ringer's at 150 mils per hour.  Creatinine continues to improve. ?Follow lactic acid levels. ? ?ID: ?Vancomycin and Zosyn been started. ?Blood cultures are pending. ?Urine cultures pending. ?Influenza and COVID testing negative. ? ?GI: ?N.p.o. except for sips and chips of water.  If the patient's hemoglobin remains stable and no further nausea/vomiting we will start clear liquids in a.m. ?Protonix for GI prophylaxis. ?Obtain right upper quadrant ultrasound ?Follow hepatic numbers. ? ?Neuro: ?CT head negative. ?Continue to follow neurologic status.  From patient's report it sounds like his neurologic status  is clearing.  Most consistent with his sepsis ? ? ?Best Practice (right click and "Reselect all SmartList Selections"  daily)  ? ?Diet/type: NPO ?DVT prophylaxis: SCD ?GI prophylaxis: PPI ?Lines: N/A ?Foley:  N/A ?Code Status:  full code ?Last date of multidisciplinary goals of care discussion '[]'$  ? ?Labs   ?CBC: ?Recent Labs  ?Lab 08/31/21 ?1343 08/31/21 ?2317  ?WBC 17.0* 36.5*  ?NEUTROABS 16.7*  --   ?HGB 11.6* 10.3*  ?HCT 35.5* 30.6*  ?MCV 93.9 94.2  ?PLT 274 292  ? ? ?Basic Metabolic Panel: ?Recent Labs  ?Lab 08/31/21 ?1343 08/31/21 ?2317  ?NA 136 134*  ?K 3.9 3.8  ?CL 96* 103  ?CO2 27 22  ?GLUCOSE 106* 132*  ?BUN 32* 30*  ?CREATININE 2.47* 2.03*  ?CALCIUM 8.5* 7.7*  ? ?GFR: ?Estimated Creatinine Clearance: 35.5 mL/min (A) (by C-G formula based on SCr of 2.03 mg/dL (H)). ?Recent Labs  ?Lab 08/31/21 ?1343 08/31/21 ?1515 08/31/21 ?2317  ?WBC 17.0*  --  36.5*  ?LATICACIDVEN 2.6* 2.5* 1.8  ? ? ?Liver Function Tests: ?Recent Labs  ?Lab 08/31/21 ?1343  ?AST 241*  ?ALT 165*  ?ALKPHOS 98  ?BILITOT 2.2*  ?PROT 6.8  ?ALBUMIN 3.1*  ? ?No results for input(s): LIPASE, AMYLASE in the last 168 hours. ?Recent Labs  ?Lab 08/31/21 ?1343  ?AMMONIA 12  ? ? ?ABG ?No results found for: PHART, PCO2ART, PO2ART, HCO3, TCO2, ACIDBASEDEF, O2SAT  ? ?Coagulation Profile: ?Recent Labs  ?Lab 08/31/21 ?1343  ?INR 1.2  ? ? ?Cardiac Enzymes: ?Recent Labs  ?Lab 08/31/21 ?1343  ?CKTOTAL 226  ? ? ?HbA1C: ?No results found for: HGBA1C ? ?CBG: ?No results for input(s): GLUCAP in the last 168 hours. ? ?Review of Systems:   ?As noted in HPI 50 minutes ? ?Past Medical History:  ?He,  has a past medical history of Cancer ([place]), EtOH dependence ([place]), Hepatitis C, Hypertension, and Shoulder impingement (2004).  ? ?Surgical History:  ? ?Past Surgical History:  ?Procedure Laterality Date  ? COLONOSCOPY WITH PROPOFOL N/A 05/03/2021  ? Procedure: COLONOSCOPY WITH PROPOFOL;  Surgeon: Eloise Harman, DO;  Location: [facility];  Service: Endoscopy;  Laterality: N/A;  7:30 / ASA III  ? FRACTURE SURGERY Left   ? wrist  ? IR 3D INDEPENDENT WKST  08/18/2021  ? IR ANGIOGRAM  SELECTIVE EACH ADDITIONAL VESSEL  08/18/2021  ? IR ANGIOGRAM SELECTIVE EACH ADDITIONAL VESSEL  08/18/2021  ? IR ANGIOGRAM VISCERAL SELECTIVE  08/18/2021  ? IR EMBO TUMOR ORGAN ISCHEMIA INFARCT INC GUIDE ROADMAPPING  08/18/2021  ? IR RADIOLOGIST EVAL & MGMT  05/18/2021  ? IR US GUIDE TISSUE ABLATION  08/18/2021  ? IR US GUIDE VASC ACCESS LEFT  08/18/2021  ? POLYPECTOMY  05/03/2021  ? Procedure: POLYPECTOMY;  Surgeon: Eloise Harman, DO;  Location: [facility];  Service: Endoscopy;;  ascending,descending  ? RADIOFREQUENCY ABLATION N/A 08/18/2021  ? Procedure: MICROWAVE  ABLATION;  Surgeon: Sandi Mariscal, MD;  Location: [facility];  Service: Anesthesiology;  Laterality: N/A;  ? SHOULDER ACROMIOPLASTY Right 11/20/2012  ? Procedure: SHOULDER ACROMIOPLASTY;  Surgeon: Sanjuana Kava, MD;  Location: [facility];  Service: Orthopedics;  Laterality: Right;  ? SKIN CANCER EXCISION Left 2013  ? wrist surgery fell off a roof  ? STOMACH SURGERY    ? had bleeding ulcers repaired. age 24  ? TOTAL KNEE ARTHROPLASTY Right 11/04/2015  ? Procedure: RIGHT TOTAL KNEE ARTHROPLASTY;  Surgeon: Carole Civil, MD;  Location: [facility];  Service: Orthopedics;  Laterality: Right;  ?  ? ?  Social History:  ? reports that he has been smoking cigars and cigarettes. He has a 49.00 pack-year smoking history. He has never used smokeless tobacco. He reports that he does not currently use alcohol. He reports current drug use. Drug: Marijuana.  ? ?Family History:  ?His family history is negative for Liver disease and Colon cancer.  ? ?Allergies ?No Known Allergies  ? ?Home Medications  ?Prior to Admission medications   ?Medication Sig Start Date End Date Taking? Authorizing Provider  ?aspirin-acetaminophen-caffeine (EXCEDRIN MIGRAINE) 250-250-65 MG tablet Take 1 tablet by mouth every 6 (six) hours as needed for headache or migraine.   Yes [provider]  ?buPROPion (WELLBUTRIN XL) 300 MG 24 hr tablet Take 300 mg by mouth every morning. 09/18/20  Yes [provider]  ?cholecalciferol (VITAMIN D3) 25 MCG (1000 UNIT) tablet Take 1,000 Units by mouth daily.   Yes [provider]  ?cloNIDine (CATAPRES) 0.1 MG tablet Take 0.1 mg by mouth at bedtime. 2

## 2021-09-01 NOTE — ED Notes (Signed)
RN Minette Brine made aware the pt has a positive aerobic BC with gram negative rods. Meriel Pica RN acknowledged the critical value. ?  ?

## 2021-09-01 NOTE — ED Notes (Signed)
This nurse sent a private message to the nurse assigned to the patient concerning positive blood cultures ?

## 2021-09-01 NOTE — Progress Notes (Signed)
Pharmacy Antibiotic Note ? ?Steven [name] is a 71 y.o. male admitted on 08/31/2021 with presumed sepsis.  Pharmacy was initially consulted for vancomycin and cefepime dosing.  Patient transferred from [facility] to [facility].  Cefepime has been d/c'ed and Zosyn per pharmacy dosing has been consulted for intra-abdominal infection and septic shock.   ? ?Plan: ?Zosyn 3.375gm IV q8h (each dose infused over 4 hours) ?Continue Vancomycin as previously ordered ?[place] renal function, cultures, and clinical status ? ?Height: '5\' 10"'$  (177.8 cm) ?Weight: 74.8 kg (165 lb) ?IBW/kg (Calculated) : 73 ? ? ?Temp (24hrs), Avg:99.9 ?F (37.7 ?C), Min:97.9 ?F (36.6 ?C), Max:103.1 ?F (39.5 ?C) ? ?Recent Labs  ?Lab 08/31/21 ?1343 08/31/21 ?1515 08/31/21 ?2317  ?WBC 17.0*  --  36.5*  ?CREATININE 2.47*  --  2.03*  ?LATICACIDVEN 2.6* 2.5* 1.8  ? ?  ?Estimated Creatinine Clearance: 35.5 mL/min (A) (by C-G formula based on SCr of 2.03 mg/dL (H)).   ? ?No Known Allergies ? ?Antimicrobials this admission: ?3/28 vanc ?3/28 cefepime >> 3/29 ?3/28 flagl x1 ?3/29 Zosyn >> ? ?Microbiology results: ?3/28 bld cx >> ?3/28 urine cx >> ?3/28 MRSA PCR >> negative ? ?Thank you for allowing pharmacy to be a part of this patient?s care. ? ?Leone Haven, PharmD ?09/01/2021 12:29 AM ? ? ? ? ? ? ?

## 2021-09-01 NOTE — Progress Notes (Addendum)
? ?NAME:  Steven [name], MRN:  [id number], DOB:  04/16/1951, LOS: 1 ?ADMISSION DATE:  08/31/2021, CONSULTATION DATE: 09/01/2021 ?REFERRING MD: [facility], CHIEF COMPLAINT: Chills and rigors ? ?History of Present Illness:  ?This is a 71 year old white male who presented to [facility] emergency room from home.  On 08/18/2021 the patient underwent guided microwave ablation of a solitary right sided liver tumor/hepatocellular carcinoma.  He states he went home did not have any abnormal symptoms until proxy 1 week ago when he developed these intermittent periods of chills and rigors.  24 hours prior to arriving in the emergency room patient started developing some mild confusion and the rigors became more consistent.  He denies any known fevers.  He did have episode of nausea and vomiting 24 hours ago.  He also developed 4 episodes of profuse diarrhea that he states he was walking around the house after having these episodes and not remembering to clean himself up.  Currently denies any chest pain/chest pressure/shortness of breath/fever/headache/dizziness/change in vision/weakness or paresthesias.  He denies any nausea or vomiting.  Positive diarrhea.  No hematochezia/hemoptysis/hematemesis/hematuria ? ?Pertinent  Medical History  ?Hepatocellular carcinoma of the right upper lobe of the liver ?Hepatitis C that was treated with Harvoni in 2018 ?EtOH abuse history last drink approximately 6 to 12 months ago ?Hypertension ?AAA ?Cholelithiasis ? ?INTERVAL HISTORY ?No acute events overnight ? ?Significant Hospital Events: ?Including procedures, antibiotic start and stop dates in addition to other pertinent events   ?3/28 [facility] ED > [facility] ICU on levophed ?3/29 levo off.  ? ?Objective   ?Blood pressure (!) 106/51, pulse 77, temperature (!) 97.5 ?F (36.4 ?C), temperature source Oral, resp. rate 15, height '5\' 10"'$  (1.778 m), weight 74.8 kg, SpO2 100 %. ?   ?   ? ?Intake/Output Summary (Last 24 hours) at 09/01/2021  0942 ?Last data filed at 09/01/2021 0700 ?Gross per 24 hour  ?Intake 6162.24 ml  ?Output 350 ml  ?Net 5812.24 ml  ? ? ?Filed Weights  ? 08/31/21 1233  ?Weight: 74.8 kg  ? ? ?Examination: ?General: adult male in NAD resting comfortably in bed ?HENT: [place]/AT, PERRL, no JVD ?Lungs: No distress, clear.  ?Cardiovascular: RRR no MRG ?Abdomen: Soft, NT, ND. Normoactive.  ?Extremities: No acute deformity or ROM limitation.  ?Neuro: Awake, alert, oriented. Non-focal.  ? ?Assessment & Plan:  ? ?Shock: septic and/or hypovolemic. Possible intrahepatic source following microwave ablation of hepatocellular carcinoma. CT abdomen described an area of mixed attenuation on the R lobe of the liver, which could indicate post ablation changes but abscess could not be ruled out. Ultrasound of the RUQ similaraly showed an abnormality in the right hepatic lobe, but no clear focus of fluid that would be c/w abscess. CT of the chest with some concern for PNA. Procalcitonin 72 on admission.  ? ?Blood cx prelim report now showing GNR ? ?- Off pressors overnight ?- Continue gentle fluid resuscitation ?- DC vancomycin ?- Continue Zosyn. Await speciation ?- Follow cultures.  ?- holding home clonidine, lasix, lisinopril, gabapentin ? ?Acute kidney injury: improving ?Severe intravascular volume depletion ?- LR @ 61m/hr ?- Trend BMP ? ?Acute hepatic insufficiency ?- repeat LFT in AM ?- [place]? ?Infrarenal abdominal aorta aneurysms ?- outpatient follow up ? ?Opioid history ?- will restart home suboxone today ? ?ETOH dependence ?- last drink reportedly > 6 months ago.  ?- Thiamine, folate ? ?Emphysema: no COPD history ?- would likely benefit from outpatient eval including  PFT ? ?Stable for transfer out of ICU today ? ?Best Practice (right click and "Reselect all SmartList Selections" daily)  ? ?Diet/type: Regular consistency (see orders) No intervention needed for liver lesion so will start diet.  ?DVT prophylaxis: SCD-  OOB today.  ?GI prophylaxis: PPI ?Lines: N/A ?Foley:  N/A ?Code Status:  full code ?Last date of multidisciplinary goals of care discussion '[ ]'$  ? ? ?Critical care time:  ?  ? ? ?Georgann Housekeeper, AGACNP-BC ?[facility] ? ?See Amion for personal pager ?PCCM on call pager [phone] until 7pm. ?Please call Elink 7p-7a. [phone] ? ?09/01/2021 10:03 AM ? ? ? ? ? ? ? ?

## 2021-09-02 DIAGNOSIS — A4151 Sepsis due to Escherichia coli [E. coli]: Secondary | ICD-10-CM

## 2021-09-02 DIAGNOSIS — K7200 Acute and subacute hepatic failure without coma: Secondary | ICD-10-CM | POA: Diagnosis not present

## 2021-09-02 DIAGNOSIS — R6521 Severe sepsis with septic shock: Secondary | ICD-10-CM

## 2021-09-02 DIAGNOSIS — K750 Abscess of liver: Secondary | ICD-10-CM

## 2021-09-02 DIAGNOSIS — R7881 Bacteremia: Secondary | ICD-10-CM

## 2021-09-02 LAB — URINE CULTURE: Culture: NO GROWTH

## 2021-09-02 LAB — COMPREHENSIVE METABOLIC PANEL
ALT: 146 U/L — ABNORMAL HIGH (ref 0–44)
AST: 74 U/L — ABNORMAL HIGH (ref 15–41)
Albumin: 2.5 g/dL — ABNORMAL LOW (ref 3.5–5.0)
Alkaline Phosphatase: 78 U/L (ref 38–126)
Anion gap: 6 (ref 5–15)
BUN: 33 mg/dL — ABNORMAL HIGH (ref 8–23)
CO2: 24 mmol/L (ref 22–32)
Calcium: 8.1 mg/dL — ABNORMAL LOW (ref 8.9–10.3)
Chloride: 104 mmol/L (ref 98–111)
Creatinine, Ser: 1.4 mg/dL — ABNORMAL HIGH (ref 0.61–1.24)
GFR, Estimated: 54 mL/min — ABNORMAL LOW (ref >60)
Glucose, Bld: 101 mg/dL — ABNORMAL HIGH (ref 70–99)
Potassium: 3.7 mmol/L (ref 3.5–5.1)
Sodium: 134 mmol/L — ABNORMAL LOW (ref 135–145)
Total Bilirubin: 0.6 mg/dL (ref 0.3–1.2)
Total Protein: 5.7 g/dL — ABNORMAL LOW (ref 6.5–8.1)

## 2021-09-02 LAB — PROCALCITONIN: Procalcitonin: 29.66 ng/mL

## 2021-09-02 LAB — CBC
HCT: 30.4 % — ABNORMAL LOW (ref 39.0–52.0)
Hemoglobin: 10.5 g/dL — ABNORMAL LOW (ref 13.0–17.0)
MCH: 32 pg (ref 26.0–34.0)
MCHC: 34.5 g/dL (ref 30.0–36.0)
MCV: 92.7 fL (ref 80.0–100.0)
Platelets: 230 10*3/uL (ref 150–400)
RBC: 3.28 MIL/uL — ABNORMAL LOW (ref 4.22–5.81)
RDW: 13.2 % (ref 11.5–15.5)
WBC: 24.3 10*3/uL — ABNORMAL HIGH (ref 4.0–10.5)
nRBC: 0 % (ref 0.0–0.2)

## 2021-09-02 LAB — MAGNESIUM: Magnesium: 2 mg/dL (ref 1.7–2.4)

## 2021-09-02 LAB — PHOSPHORUS: Phosphorus: 2.5 mg/dL (ref 2.5–4.6)

## 2021-09-02 MED ORDER — MELATONIN 3 MG PO TABS
3.0000 mg | ORAL_TABLET | Freq: Once | ORAL | Status: AC
  Administered 2021-09-02: 3 mg via ORAL
  Filled 2021-09-02: qty 1

## 2021-09-02 MED ORDER — LIP MEDEX EX OINT
TOPICAL_OINTMENT | CUTANEOUS | Status: DC | PRN
  Administered 2021-09-02: 75 via TOPICAL
  Filled 2021-09-02: qty 7

## 2021-09-02 MED ORDER — METRONIDAZOLE 500 MG PO TABS
500.0000 mg | ORAL_TABLET | Freq: Two times a day (BID) | ORAL | Status: DC
  Administered 2021-09-02 – 2021-09-06 (×9): 500 mg via ORAL
  Filled 2021-09-02 (×9): qty 1

## 2021-09-02 NOTE — Plan of Care (Signed)
Pt aox4, pleasant and cooperative with staff.  ?IVF / IV Abx per orders.  ? ?Problem: Education: ?Goal: Knowledge of General Education information will improve ?Description: Including pain rating scale, medication(s)/side effects and non-pharmacologic comfort measures ?Outcome: Progressing ?  ?Problem: Health Behavior/Discharge Planning: ?Goal: Ability to manage health-related needs will improve ?Outcome: Progressing ?  ?Problem: Clinical Measurements: ?Goal: Ability to maintain clinical measurements within normal limits will improve ?Outcome: Progressing ?Goal: Will remain free from infection ?Outcome: Progressing ?Goal: Diagnostic test results will improve ?Outcome: Progressing ?Goal: Respiratory complications will improve ?Outcome: Progressing ?Goal: Cardiovascular complication will be avoided ?Outcome: Progressing ?  ?Problem: Activity: ?Goal: Risk for activity intolerance will decrease ?Outcome: Progressing ?  ?Problem: Nutrition: ?Goal: Adequate nutrition will be maintained ?Outcome: Progressing ?  ?Problem: Coping: ?Goal: Level of anxiety will decrease ?Outcome: Progressing ?  ?Problem: Elimination: ?Goal: Will not experience complications related to bowel motility ?Outcome: Progressing ?Goal: Will not experience complications related to urinary retention ?Outcome: Progressing ?  ?Problem: Pain Managment: ?Goal: General experience of comfort will improve ?Outcome: Progressing ?  ?Problem: Safety: ?Goal: Ability to remain free from injury will improve ?Outcome: Progressing ?  ?Problem: Skin Integrity: ?Goal: Risk for impaired skin integrity will decrease ?Outcome: Progressing ?  ?

## 2021-09-02 NOTE — Consult Note (Signed)
? ? ?[facility]  ?                                                                                     ? ?Patient Identification: ?Patient Name: Steven [name] MRN: [id number] [place] Date: 08/31/2021 12:33 PM ?Today's Date: 09/02/2021 ?Reason for consult: GNR bacteremia  ?Requesting provider: Shanda Howells  ? ?Principal Problem: ?  Sepsis ([place]) ?Active Problems: ?  Severe sepsis ([place]) ? ? ?Antibiotics:  ?Vancomycin 3/28- ?Zosyn 3/28- ?Cefepime 3/28-c ?Metronidazole 3/28, 3/30 ? ?Lines/Hardware: Rt TKA ? ?Assessment ?# GNR bacteremia ( Enterobacter cloacae complex and E coli, no ESBL) in the context of recent IR hepatic procedure ? ?# ? hepatic abscess in the setting of recent IR intervention for possible solitary rt sided hepatic carcinoma  ?- s/p percutaneous bland embolization and fluoroscopic guided microwave ablation on 3/15 by IR with a curative intent  ? ?# Tranasminitis - down trending  ? ?# H/o Hepatitis C - treated with Harvoni in 2018 with SVR ?# H/o alcohol use  ?# Opioid dependence ?# Smoking - 1 pack every day, counseled  ? ?Recommendations  ?De-escalate abtx to IV cefepime and PO metronidazole ?FU blood cultures fo identification of GNR and sensi ?IR eval for possible drainage of hepatic abscess  ?Monitor CBC and BMP ?Following   ? ?Rest of the management as per the primary team. Please call with questions or concerns.  ?Thank you for the consult ? ?Rosiland Oz, MD ?Infectious Disease Physician ?[facility] ?[address] ?[place], [place] [address] ?Phone: [phone]  Fax: [phone] ? ?__________________________________________________________________________________________________________ ?HPI and Hospital Course: ?71 year old male with PMH of hepatitis C (treated with Harvoni with SVR in 2018), alcoholism/tobacco/opioid use, hypertension, skin  cancer, AAA, RT TKA, cholelithiasis who recently underwent percutaneous bland embolization and fluoroscopic guided microwave ablation of solitary right-sided hepatocellular carcinoma on 3/15 by IR who presented to the ED on 3/28 with generalized weakness since morning of presentation.  He felt so weak that he could not get out of bed and defecated on himself. Also had subjective fevers and chills/rigors. Had vomiting and diarrhea  prior to admit but no abdominal pain.  Per roommate he was acting altered since Sunday evening.  He also had decreased p.o. appetite.  Patient has reportedly not use alcohol for the last 8 months.  He seems to be doing well after his recent hepatic ablation with IR on 3/15 up until started symptoms recently a week ago.  ? ?AT ED, 103.1 ?Labs remarkable for AKI with creatinine 2.03, lactic acid 2.5, WBC 36.5 ?Imagings as below ?He was admitted to the MICU with requirement of vasopressors briefly  ? ?ROS: all systems reviewed with pertinent positives and negatives listed above  ? ?Past Medical History:  ?Diagnosis Date  ? Cancer [facility])   ? skin adn liver  ? EtOH dependence ([place])   ? Hepatitis C   ? successfully eradicated with Harvoni in 2018 with SVR documented  ? Hypertension   ? Shoulder impingement 2004  ? ?Past Surgical History:  ?Procedure Laterality Date  ? COLONOSCOPY WITH PROPOFOL N/A 05/03/2021  ?  Procedure: COLONOSCOPY WITH PROPOFOL;  Surgeon: Eloise Harman, DO;  [facility];  Service: Endoscopy;  Laterality: N/A;  7:30 / ASA III  ? FRACTURE SURGERY Left   ? wrist  ? IR 3D INDEPENDENT WKST  08/18/2021  ? IR ANGIOGRAM SELECTIVE EACH ADDITIONAL VESSEL  08/18/2021  ? IR ANGIOGRAM SELECTIVE EACH ADDITIONAL VESSEL  08/18/2021  ? IR ANGIOGRAM VISCERAL SELECTIVE  08/18/2021  ? IR EMBO TUMOR ORGAN ISCHEMIA INFARCT INC GUIDE ROADMAPPING  08/18/2021  ? IR RADIOLOGIST EVAL & MGMT  05/18/2021  ? IR US GUIDE TISSUE ABLATION  08/18/2021  ? IR US GUIDE VASC ACCESS LEFT  08/18/2021  ?  POLYPECTOMY  05/03/2021  ? Procedure: POLYPECTOMY;  Surgeon: Eloise Harman, DO;  Location[facility];  Service: Endoscopy;;  ascending,descending  ? RADIOFREQUENCY ABLATION N/A 08/18/2021  ? Procedure: MICROWAVE  ABLATION;  Surgeon: Sandi Mariscal, MD;  Location: [facility];  Service: Anesthesiology;  Laterality: N/A;  ? SHOULDER ACROMIOPLASTY Right 11/20/2012  ? Procedure: SHOULDER ACROMIOPLASTY;  Surgeon: Sanjuana Kava, MD;  Location: [facility];  Service: Orthopedics;  Laterality: Right;  ? SKIN CANCER EXCISION Left 2013  ? wrist surgery fell off a roof  ? STOMACH SURGERY    ? had bleeding ulcers repaired. age 65  ? TOTAL KNEE ARTHROPLASTY Right 11/04/2015  ? Procedure: RIGHT TOTAL KNEE ARTHROPLASTY;  Surgeon: Carole Civil, MD;  Location: [facility];  Service: Orthopedics;  Laterality: Right;  ? ? ? ?Scheduled Meds: ? buprenorphine-naloxone  1 tablet Sublingual Daily  ? Chlorhexidine Gluconate Cloth  6 each Topical Daily  ? metroNIDAZOLE  500 mg Oral Q12H  ? pantoprazole (PROTONIX) IV  40 mg Intravenous QHS  ? ?Continuous Infusions: ? sodium chloride Stopped (09/01/21 0900)  ? ceFEPime (MAXIPIME) IV 2 g (09/02/21 0604)  ? lactated ringers 75 mL/hr at 09/01/21 2029  ? ?PRN Meds:.acetaminophen, docusate sodium, ondansetron (ZOFRAN) IV, polyethylene glycol ? ?No Known Allergies ? ?Social History  ? ?Socioeconomic History  ? Marital status: Divorced  ?  Spouse name: Not on file  ? Number of children: Not on file  ? Years of education: Not on file  ? Highest education level: Not on file  ?Occupational History  ? Not on file  ?Tobacco Use  ? Smoking status: Every Day  ?  Packs/day: 1.00  ?  Years: 49.00  ?  Pack years: 49.00  ?  Types: Cigars, Cigarettes  ? Smokeless tobacco: Never  ?Vaping Use  ? Vaping Use: Never used  ?Substance and Sexual Activity  ? Alcohol use: Not Currently  ?  Comment: 09/22/20 stopped drinking beer  ? Drug use: Yes  ?  Types: Marijuana  ?  Comment: every 3-4 months  ? Sexual activity: Yes  ?Other  Topics Concern  ? Not on file  ?Social History Narrative  ? Not on file  ? ?Social Determinants of Health  ? ?Financial Resource Strain: Not on file  ?Food Insecurity: Not on file  ?Transportation Needs: Not on file  ?Physical Activity: Not on file  ?Stress: Not on file  ?Social Connections: Not on file  ?Intimate Partner Violence: Not on file  ? ?Breast Cancer-relatedfamily history is not on file. ? ?Vitals ?BP (!) 141/71 (BP Location: Left Arm)   Pulse 81   Temp (!) 97.5 ?F (36.4 ?C) (Oral)   Resp 18   Ht '5\' 10"'$  (1.778 m)   Wt 79.5 kg   SpO2 97%   BMI 25.15 kg/m?  ? ? ?  Physical Exam ?Constitutional:  sitting up in the bed, appears comfortable  ?   Comments:  ? ?Cardiovascular:  ?   Rate and Rhythm: Normal rate and regular rhythm.  ?   Heart sounds:  ? ?Pulmonary:  ?   Effort: Pulmonary effort is normal.  ?   Comments:  ? ?Abdominal:  ?   Palpations: Abdomen is soft.  ?   Tenderness: non tender and non distended  ? ?Musculoskeletal:     ?   General: No swelling or tenderness.  ? ?Skin: ?   Comments:  ? ?Neurological:  ?   General: Grossly non focal, awake, alert and oriented  ? ?Psychiatric:     ?   Mood and Affect: Mood normal.  ? ? ?Pertinent Microbiology ?Results for orders placed or performed during the hospital encounter of 08/31/21  ?Blood Culture (routine x 2)     Status: Abnormal (Preliminary result)  ? Collection Time: 08/31/21  1:43 PM  ? Specimen: BLOOD  ?Result Value Ref Range Status  ? Specimen Description   Final  ?  BLOOD LEFT ANTECUBITAL ?Performed at [facility], [address], [place], [place] [address] ?  ? Special Requests   Final  ?  Blood Culture adequate volume BOTTLES DRAWN AEROBIC AND ANAEROBIC ?Performed at [facility], [address] [place], [place] [address] ?  ? Culture  Setup Time   Final  ?  GRAM NEGATIVE RODS BOTTLES DRAWN AEROBIC ONLY ?Gram Stain Report Called to,Read Back By and Verified With: B SHOOK RN [phone] K FORSYTH ?CRITICAL RESULT CALLED TO, READ BACK BY AND  VERIFIED WITH: Lindell Spar [facility] 09/01/21 @ 1556 BY DRT ?Performed at [facility], [address] [place], [place] [address] ?  ? Culture (A)  Final  ?  ESCHERICHIA COLI ?CULTURE REINCUBATED FOR BETTER GROWTH ?SUSCEPT

## 2021-09-02 NOTE — Progress Notes (Signed)
?PROGRESS NOTE ? ? ? ?Steven [name]  ALP:[id number] DOB: [birth year] DOA: 08/31/2021 ? ?PCP: [name], MD  ? ? ?Brief Narrative:  ?This 71 years old male with PMH significant for hepatocellular carcinoma, hepatitis C, treated with Harvoni, EtOH abuse, hypertension, AAA presented in the [facility] ED.  Patient presented in the ED with intermittent fevers of chills and rigors.  Patient underwent microwave guided ablation of a solitary right-sided liver tumor. ?Patient was admitted for septic shock requiring Levophed support and initially admitted in the ICU.Marland Kitchen  He is successfully weaned off of Levophed.  Ultrasound RUQ showed abnormality in the right hepatic lobe but no clear focus of fluid or abscess.  CTA chest showed some concern for pneumonia.  Procalcitonin elevated on admission.  Blood cultures growing gram-negative rods.  Antibiotic changed to Zosyn. ? ?Assessment & Plan: ?  ?Principal Problem: ?  Sepsis ([place]) ?Active Problems: ?  Severe sepsis ([place]) ? ?Septic shock secondary to E. coli bacteremia: ?Patient presented with tachycardia tachypnea, hypotension requiring Levophed support. ?Patient is now off Levophed.  Blood cultures growing E. coli Enterobacter. ?Antibiotics changed to Zosyn.  Infectious disease consulted. ?Blood pressure has improved, shock resolved. ?Lactic acidosis resolved with IV hydration. ?Procalcitonin 72> 65> 29 ? ?Acute kidney injury: ?Presented with serum creatinine 2.4, baseline creatinine normal ?Could be secondary to hypertension. ?Avoid nephrotoxic medications ?Serum creatinine improving 2.42> 2.03> 1.94> 1.40 ?Continue IV hydration. ? ?Acute hepatic failure: ?[facility] ?Continue to monitor LFTs. ? ?Infrarenal AAA: ?Continue outpatient follow-up ? ?Opioid abuse: ?Continue Suboxone. ? ?EtOH dependence: ?Continue thiamine, folate. ? ?COPD: stable, Continue regular inhaler ? ? ?DVT prophylaxis: SCDs ?Code Status: Full code. ?Family  Communication: No family at bed side. ?Disposition Plan: ?Status is: Inpatient ?Remains inpatient appropriate because: Admitted for septic shock secondary to hepatic abscess.  Initially requiring Levophed support,  now off Levophed, remains on IV antibiotics.  Cultures pending. ?  ? ?Consultants:  ?PCCM ? ?Procedures:  CTA /P ?Antimicrobials: ?Anti-infectives (From admission, onward)  ? ? Start     Dose/Rate Route Frequency Ordered Stop  ? 09/02/21 1200  metroNIDAZOLE (FLAGYL) tablet 500 mg       ? 500 mg Oral Every 12 hours 09/02/21 1108    ? 09/01/21 1800  cefTRIAXone (ROCEPHIN) 2 g in sodium chloride 0.9 % 100 mL IVPB  Status:  Discontinued       ? 2 g ?200 mL/hr over 30 Minutes Intravenous Every 24 hours 09/01/21 1628 09/01/21 1721  ? 09/01/21 1800  ceFEPIme (MAXIPIME) 2 g in sodium chloride 0.9 % 100 mL IVPB       ? 2 g ?200 mL/hr over 30 Minutes Intravenous Every 12 hours 09/01/21 1721    ? 09/01/21 1500  vancomycin (VANCOREADY) IVPB 750 mg/150 mL  Status:  Discontinued       ? 750 mg ?150 mL/hr over 60 Minutes Intravenous Every 24 hours 08/31/21 2017 09/01/21 0801  ? 09/01/21 0200  ceFEPIme (MAXIPIME) 2 g in sodium chloride 0.9 % 100 mL IVPB  Status:  Discontinued       ? 2 g ?200 mL/hr over 30 Minutes Intravenous Every 12 hours 08/31/21 2017 09/01/21 0010  ? 09/01/21 0200  piperacillin-tazobactam (ZOSYN) IVPB 3.375 g  Status:  Discontinued       ? 3.375 g ?12.5 mL/hr over 240 Minutes Intravenous Every 8 hours 09/01/21 0024 09/01/21 1628  ? 08/31/21 1415  vancomycin (VANCOREADY) IVPB 1500 mg/300 mL  Status:  Discontinued       ? 1,500 mg ?150 mL/hr over 120 Minutes Intravenous NOW 08/31/21 1409 09/01/21 0739  ? 08/31/21 1330  ceFEPIme (MAXIPIME) 2 g in sodium chloride 0.9 % 100 mL IVPB       ? 2 g ?200 mL/hr over 30 Minutes Intravenous  Once 08/31/21 1326 08/31/21 1445  ? 08/31/21 1330  metroNIDAZOLE (FLAGYL) IVPB 500 mg       ? 500 mg ?100 mL/hr over 60 Minutes Intravenous  Once 08/31/21 1326 08/31/21 1444   ? 08/31/21 1330  vancomycin (VANCOCIN) IVPB 1000 mg/200 mL premix  Status:  Discontinued       ? 1,000 mg ?200 mL/hr over 60 Minutes Intravenous  Once 08/31/21 1326 08/31/21 1621  ? ?  ?  ? ?Subjective: ?Patient was seen and examined at bedside.  Overnight events noted . ?Patient reports feeling better denies any fever, chills reports pain has resolved. ? ?Objective: ?Vitals:  ? 09/02/21 0037 09/02/21 1610 09/02/21 0458 09/02/21 1303  ?BP: 120/67 (!) 141/71  129/72  ?Pulse: 80 81  86  ?Resp: '16 18  18  '$ ?Temp: (!) 97.5 ?F (36.4 ?C) (!) 97.5 ?F (36.4 ?C)  98.1 ?F (36.7 ?C)  ?TempSrc: Oral Oral  Oral  ?SpO2: 98% 97%  96%  ?Weight:   79.5 kg   ?Height:      ? ? ?Intake/Output Summary (Last 24 hours) at 09/02/2021 1450 ?Last data filed at 09/02/2021 0900 ?Gross per 24 hour  ?Intake 1915.25 ml  ?Output 150 ml  ?Net 1765.25 ml  ? ?Filed Weights  ? 08/31/21 1233 09/02/21 0458  ?Weight: 74.8 kg 79.5 kg  ? ? ?Examination: ? ?General exam: Appears comfortable, not in any acute distress.  Deconditioned ?Respiratory system: CTA bilaterally, no wheezing, no crackles, normal respiratory effort. ?Cardiovascular system: S1 & S2 heard, regular rate and rhythm, no murmur. ?Gastrointestinal system: Abdomen is soft, nontender, nondistended, BS + ?Central nervous system: Alert and oriented x 3. No focal neurological deficits. ?Extremities: No edema, no cyanosis, no clubbing ?Skin: No rashes, lesions or ulcers ?Psychiatry: Judgement and insight appear normal. Mood & affect appropriate.  ? ? ? ?Data Reviewed: I have personally reviewed following labs and imaging studies ? ?CBC: ?Recent Labs  ?Lab 08/31/21 ?1343 08/31/21 ?2317 09/01/21 ?0237 09/02/21 ?9604  ?WBC 17.0* 36.5* 32.5* 24.3*  ?NEUTROABS 16.7*  --   --   --   ?HGB 11.6* 10.3* 10.1* 10.5*  ?HCT 35.5* 30.6* 30.0* 30.4*  ?MCV 93.9 94.2 93.8 92.7  ?PLT 274 292 230 230  ? ?Basic Metabolic Panel: ?Recent Labs  ?Lab 08/31/21 ?1343 08/31/21 ?2317 09/01/21 ?0237 09/02/21 ?5409  ?NA 136 134*  134* 134*  ?K 3.9 3.8 4.1 3.7  ?CL 96* 103 104 104  ?CO2 '27 22 24 24  '$ ?GLUCOSE 106* 132* 120* 101*  ?BUN 32* 30* 31* 33*  ?CREATININE 2.47* 2.03* 1.94* 1.40*  ?CALCIUM 8.5* 7.7* 7.8* 8.1*  ?MG  --   --  1.8 2.0  ?PHOS  --   --  3.9 2.5  ? ?GFR: ?Estimated Creatinine Clearance: 51.4 mL/min (A) (by C-G formula based on SCr of 1.4 mg/dL (H)). ?Liver Function Tests: ?Recent Labs  ?Lab 08/31/21 ?1343 09/02/21 ?8119  ?AST 241* 74*  ?ALT 165* 146*  ?ALKPHOS 98 78  ?BILITOT 2.2* 0.6  ?PROT 6.8 5.7*  ?ALBUMIN 3.1* 2.5*  ? ?No results for input(s): LIPASE, AMYLASE in the last 168 hours. ?Recent Labs  ?Lab 08/31/21 ?1343  ?AMMONIA 12  ? ?  Coagulation Profile: ?Recent Labs  ?Lab 08/31/21 ?1343  ?INR 1.2  ? ?Cardiac Enzymes: ?Recent Labs  ?Lab 08/31/21 ?1343  ?CKTOTAL 226  ? ?BNP (last 3 results) ?No results for input(s): PROBNP in the last 8760 hours. ?HbA1C: ?No results for input(s): HGBA1C in the last 72 hours. ?CBG: ?No results for input(s): GLUCAP in the last 168 hours. ?Lipid Profile: ?No results for input(s): CHOL, HDL, LDLCALC, TRIG, CHOLHDL, LDLDIRECT in the last 72 hours. ?Thyroid Function Tests: ?No results for input(s): TSH, T4TOTAL, FREET4, T3FREE, THYROIDAB in the last 72 hours. ?Anemia Panel: ?No results for input(s): VITAMINB12, FOLATE, FERRITIN, TIBC, IRON, RETICCTPCT in the last 72 hours. ?Sepsis Labs: ?Recent Labs  ?Lab 08/31/21 ?1343 08/31/21 ?1515 08/31/21 ?2317 09/01/21 ?0237 09/02/21 ?6314  ?PROCALCITON  --   --  72.82 65.87 29.66  ?LATICACIDVEN 2.6* 2.5* 1.8  --   --   ? ? ?Recent Results (from the past 240 hour(s))  ?Blood Culture (routine x 2)     Status: Abnormal (Preliminary result)  ? Collection Time: 08/31/21  1:43 PM  ? Specimen: BLOOD  ?Result Value Ref Range Status  ? Specimen Description   Final  ?  BLOOD LEFT ANTECUBITAL ?Performed at [facility], [address] [place], [place] [address] ?  ? Special Requests   Final  ?  Blood Culture adequate volume BOTTLES DRAWN AEROBIC AND  ANAEROBIC ?Performed at [facility], [address] [place], [place] [address] ?  ? Culture  Setup Time   Final  ?  GRAM NEGATIVE RODS BOTTLES DRAWN AEROBIC ONLY ?Gram Stain Report Called to,Read Back By and Verified Gardiner Sleeper

## 2021-09-03 ENCOUNTER — Inpatient Hospital Stay (HOSPITAL_COMMUNITY): Payer: Medicare Other

## 2021-09-03 DIAGNOSIS — R6521 Severe sepsis with septic shock: Secondary | ICD-10-CM | POA: Diagnosis not present

## 2021-09-03 DIAGNOSIS — A4151 Sepsis due to Escherichia coli [E. coli]: Secondary | ICD-10-CM | POA: Diagnosis not present

## 2021-09-03 DIAGNOSIS — K7200 Acute and subacute hepatic failure without coma: Secondary | ICD-10-CM | POA: Diagnosis not present

## 2021-09-03 LAB — CBC
HCT: 33.2 % — ABNORMAL LOW (ref 39.0–52.0)
Hemoglobin: 11.1 g/dL — ABNORMAL LOW (ref 13.0–17.0)
MCH: 31.5 pg (ref 26.0–34.0)
MCHC: 33.4 g/dL (ref 30.0–36.0)
MCV: 94.3 fL (ref 80.0–100.0)
Platelets: 217 10*3/uL (ref 150–400)
RBC: 3.52 MIL/uL — ABNORMAL LOW (ref 4.22–5.81)
RDW: 13.2 % (ref 11.5–15.5)
WBC: 17.2 10*3/uL — ABNORMAL HIGH (ref 4.0–10.5)
nRBC: 0 % (ref 0.0–0.2)

## 2021-09-03 LAB — BASIC METABOLIC PANEL
Anion gap: 7 (ref 5–15)
BUN: 28 mg/dL — ABNORMAL HIGH (ref 8–23)
CO2: 22 mmol/L (ref 22–32)
Calcium: 8.4 mg/dL — ABNORMAL LOW (ref 8.9–10.3)
Chloride: 105 mmol/L (ref 98–111)
Creatinine, Ser: 1.35 mg/dL — ABNORMAL HIGH (ref 0.61–1.24)
GFR, Estimated: 57 mL/min — ABNORMAL LOW (ref >60)
Glucose, Bld: 90 mg/dL (ref 70–99)
Potassium: 4.4 mmol/L (ref 3.5–5.1)
Sodium: 134 mmol/L — ABNORMAL LOW (ref 135–145)

## 2021-09-03 MED ORDER — TECHNETIUM TC 99M MEBROFENIN IV KIT
5.4000 | PACK | Freq: Once | INTRAVENOUS | Status: AC
  Administered 2021-09-03: 5.4 via INTRAVENOUS

## 2021-09-03 MED ORDER — CLONIDINE HCL 0.1 MG PO TABS
0.1000 mg | ORAL_TABLET | Freq: Every day | ORAL | Status: DC
  Administered 2021-09-03 – 2021-09-05 (×3): 0.1 mg via ORAL
  Filled 2021-09-03 (×3): qty 1

## 2021-09-03 MED ORDER — BUPROPION HCL ER (XL) 300 MG PO TB24
300.0000 mg | ORAL_TABLET | Freq: Every day | ORAL | Status: DC
  Administered 2021-09-03 – 2021-09-06 (×4): 300 mg via ORAL
  Filled 2021-09-03 (×4): qty 1

## 2021-09-03 NOTE — Clinical Social Work Note (Signed)
?  Transition of Care (TOC) Screening Note ? ? ?Patient Details  ?Name: Steven [name] ?Date of Birth: September 02, 1950 ? ? ?Transition of Care (TOC) CM/SW Contact:    ?Trish Mage, LCSW ?Phone Number: ?09/03/2021, 10:46 AM ? ? ? ?Transition of Care Department [facility]) has reviewed patient and no TOC needs have been identified at this time. We will continue to monitor patient advancement through interdisciplinary progression rounds. If new patient transition needs arise, please place a TOC consult. ? ? ?

## 2021-09-03 NOTE — Progress Notes (Signed)
Patient ID: Steven [name], male   DOB: 11-22-1950, 71 y.o.   MRN: [id number] ?Pt with hx HCC, s/p percutaneous bland embolization and fluoroscopic guided microwave ablation of solitary right-sided [place] on 08/18/2021. Gas at the ablation site is expected.  Pt's HIDA scan today was normal. Case reviewed by Drs. Watts/Henn/Mir. Nothing more for IR to do at this point. Rec cont antbx and pain control. Pt will be followed in IR clinic.  ?

## 2021-09-03 NOTE — Progress Notes (Signed)
?PROGRESS NOTE ? ? ? ?Steven [name]  YCX:[id number] DOB: Apr 17, 1951 DOA: 08/31/2021 ? ?PCP: [name], MD  ? ? ?Brief Narrative:  ?This 71 years old male with PMH significant for hepatocellular carcinoma, hepatitis C, treated with Harvoni, EtOH abuse, hypertension, AAA presented in the [facility] ED.  Patient presented in the ED with intermittent fevers of chills and rigors.  Patient underwent microwave guided ablation of a solitary right-sided liver tumor. ?Patient was admitted for septic shock requiring Levophed support and initially admitted in the ICU.Marland Kitchen  He is successfully weaned off of Levophed.  Ultrasound RUQ showed abnormality in the right hepatic lobe but no clear focus of fluid or abscess.  CTA chest showed some concern for pneumonia.  Procalcitonin elevated on admission.  Blood cultures growing gram-negative rods.  Infectious disease consulted, antibiotics changed to cefepime IV and oral metronidazole.  IR evaluation for possible hepatic abscess. ? ?Assessment & Plan: ?  ?Principal Problem: ?  Sepsis ([place]) ?Active Problems: ?  Severe sepsis ([place]) ?  Bacteremia ?  Hepatic abscess ? ?Septic shock secondary to E. coli bacteremia: ?Patient presented with tachycardia, tachypnea, hypotension requiring Levophed support. ?Patient is now off Levophed. Blood cultures growing E. Coli / Enterobacter. ?Antibiotics changed to Zosyn.  Infectious disease consulted.  Antibiotic changed to cefepime IV and oral metronidazole. ?Blood pressure has improved, shock resolved. ?Lactic acidosis resolved with IV hydration. ?Procalcitonin 72> 65> 29 ? ?Acute kidney injury: ?Presented with serum creatinine 2.4, baseline creatinine normal. ?Could be secondary to hypotension. ?Avoid nephrotoxic medications ?Serum creatinine improving 2.42> 2.03> 1.94> 1.40>1.35 ?Continue IV hydration. ? ?Acute hepatic failure: ?[facility] ?Continue to monitor LFTs. ?IR recommended HIDA scan which is  normal. ?Follow-up IR for possible hepatic abscess. ? ?Infrarenal AAA: ?Continue outpatient follow-up ? ?Opioid abuse: ?Continue Suboxone. ? ?EtOH dependence: ?Continue thiamine, folate. ? ?COPD: stable, Continue regular inhaler ? ? ?DVT prophylaxis: SCDs ?Code Status: Full code. ?Family Communication: No family at bed side. ?Disposition Plan: ?Status is: Inpatient ?Remains inpatient appropriate because: Admitted for septic shock secondary to hepatic abscess.  Initially requiring Levophed support,  now off Levophed, remains on IV antibiotics.  Cultures pending. ?  ? ?Consultants:  ?PCCM ? ?Procedures:  CTA /P ?Antimicrobials: ?Anti-infectives (From admission, onward)  ? ? Start     Dose/Rate Route Frequency Ordered Stop  ? 09/02/21 1200  metroNIDAZOLE (FLAGYL) tablet 500 mg       ? 500 mg Oral Every 12 hours 09/02/21 1108    ? 09/01/21 1800  cefTRIAXone (ROCEPHIN) 2 g in sodium chloride 0.9 % 100 mL IVPB  Status:  Discontinued       ? 2 g ?200 mL/hr over 30 Minutes Intravenous Every 24 hours 09/01/21 1628 09/01/21 1721  ? 09/01/21 1800  ceFEPIme (MAXIPIME) 2 g in sodium chloride 0.9 % 100 mL IVPB       ? 2 g ?200 mL/hr over 30 Minutes Intravenous Every 12 hours 09/01/21 1721    ? 09/01/21 1500  vancomycin (VANCOREADY) IVPB 750 mg/150 mL  Status:  Discontinued       ? 750 mg ?150 mL/hr over 60 Minutes Intravenous Every 24 hours 08/31/21 2017 09/01/21 0801  ? 09/01/21 0200  ceFEPIme (MAXIPIME) 2 g in sodium chloride 0.9 % 100 mL IVPB  Status:  Discontinued       ? 2 g ?200 mL/hr over 30 Minutes Intravenous Every 12 hours 08/31/21 2017 09/01/21 0010  ? 09/01/21 0200  piperacillin-tazobactam (ZOSYN)  IVPB 3.375 g  Status:  Discontinued       ? 3.375 g ?12.5 mL/hr over 240 Minutes Intravenous Every 8 hours 09/01/21 0024 09/01/21 1628  ? 08/31/21 1415  vancomycin (VANCOREADY) IVPB 1500 mg/300 mL  Status:  Discontinued       ? 1,500 mg ?150 mL/hr over 120 Minutes Intravenous NOW 08/31/21 1409 09/01/21 0739  ? 08/31/21 1330   ceFEPIme (MAXIPIME) 2 g in sodium chloride 0.9 % 100 mL IVPB       ? 2 g ?200 mL/hr over 30 Minutes Intravenous  Once 08/31/21 1326 08/31/21 1445  ? 08/31/21 1330  metroNIDAZOLE (FLAGYL) IVPB 500 mg       ? 500 mg ?100 mL/hr over 60 Minutes Intravenous  Once 08/31/21 1326 08/31/21 1444  ? 08/31/21 1330  vancomycin (VANCOCIN) IVPB 1000 mg/200 mL premix  Status:  Discontinued       ? 1,000 mg ?200 mL/hr over 60 Minutes Intravenous  Once 08/31/21 1326 08/31/21 1621  ? ?  ?  ? ?Subjective: ?Patient was seen and examined at bedside.  Overnight events noted . ?Patient reports feeling better, denies any fever, chills. He denies any pain. ?Patient underwent HIDA scan which is completely normal. ? ?Objective: ?Vitals:  ? 09/02/21 1303 09/02/21 2100 09/03/21 0519 09/03/21 0947  ?BP: 129/72 133/77 131/73 139/71  ?Pulse: 86 82 80 85  ?Resp: '18 18 18 19  '$ ?Temp: 98.1 ?F (36.7 ?C) 98.1 ?F (36.7 ?C) (!) 97.5 ?F (36.4 ?C) 98.1 ?F (36.7 ?C)  ?TempSrc: Oral  Oral Oral  ?SpO2: 96% 98% 98% 100%  ?Weight:      ?Height:      ? ? ?Intake/Output Summary (Last 24 hours) at 09/03/2021 1338 ?Last data filed at 09/03/2021 1036 ?Gross per 24 hour  ?Intake 2282.24 ml  ?Output 1490 ml  ?Net 792.24 ml  ? ?Filed Weights  ? 08/31/21 1233 09/02/21 0458  ?Weight: 74.8 kg 79.5 kg  ? ? ?Examination: ? ?General exam: Appears comfortable, not in any acute distress.  Deconditioned ?Respiratory system: CTA bilaterally, no wheezing, no crackles, normal respiratory effort. ?Cardiovascular system: S1 & S2 heard, regular rate and rhythm, no murmur. ?Gastrointestinal system: Abdomen is soft, non tender, non distended, BS+ ?Central nervous system: Alert and oriented x 3. No focal neurological deficits. ?Extremities: No edema, no cyanosis, no clubbing ?Skin: No rashes, lesions or ulcers ?Psychiatry: Judgement and insight appear normal. Mood & affect appropriate.  ? ? ? ?Data Reviewed: I have personally reviewed following labs and imaging studies ? ?CBC: ?Recent Labs   ?Lab 08/31/21 ?1343 08/31/21 ?2317 09/01/21 ?0237 09/02/21 ?9373 09/03/21 ?0527  ?WBC 17.0* 36.5* 32.5* 24.3* 17.2*  ?NEUTROABS 16.7*  --   --   --   --   ?HGB 11.6* 10.3* 10.1* 10.5* 11.1*  ?HCT 35.5* 30.6* 30.0* 30.4* 33.2*  ?MCV 93.9 94.2 93.8 92.7 94.3  ?PLT 274 292 230 230 217  ? ?Basic Metabolic Panel: ?Recent Labs  ?Lab 08/31/21 ?1343 08/31/21 ?2317 09/01/21 ?0237 09/02/21 ?4287 09/03/21 ?0527  ?NA 136 134* 134* 134* 134*  ?K 3.9 3.8 4.1 3.7 4.4  ?CL 96* 103 104 104 105  ?CO2 '27 22 24 24 22  '$ ?GLUCOSE 106* 132* 120* 101* 90  ?BUN 32* 30* 31* 33* 28*  ?CREATININE 2.47* 2.03* 1.94* 1.40* 1.35*  ?CALCIUM 8.5* 7.7* 7.8* 8.1* 8.4*  ?MG  --   --  1.8 2.0  --   ?PHOS  --   --  3.9 2.5  --   ? ?  GFR: ?Estimated Creatinine Clearance: 53.3 mL/min (A) (by C-G formula based on SCr of 1.35 mg/dL (H)). ?Liver Function Tests: ?Recent Labs  ?Lab 08/31/21 ?1343 09/02/21 ?4010  ?AST 241* 74*  ?ALT 165* 146*  ?ALKPHOS 98 78  ?BILITOT 2.2* 0.6  ?PROT 6.8 5.7*  ?ALBUMIN 3.1* 2.5*  ? ?No results for input(s): LIPASE, AMYLASE in the last 168 hours. ?Recent Labs  ?Lab 08/31/21 ?1343  ?AMMONIA 12  ? ?Coagulation Profile: ?Recent Labs  ?Lab 08/31/21 ?1343  ?INR 1.2  ? ?Cardiac Enzymes: ?Recent Labs  ?Lab 08/31/21 ?1343  ?CKTOTAL 226  ? ?BNP (last 3 results) ?No results for input(s): PROBNP in the last 8760 hours. ?HbA1C: ?No results for input(s): HGBA1C in the last 72 hours. ?CBG: ?No results for input(s): GLUCAP in the last 168 hours. ?Lipid Profile: ?No results for input(s): CHOL, HDL, LDLCALC, TRIG, CHOLHDL, LDLDIRECT in the last 72 hours. ?Thyroid Function Tests: ?No results for input(s): TSH, T4TOTAL, FREET4, T3FREE, THYROIDAB in the last 72 hours. ?Anemia Panel: ?No results for input(s): VITAMINB12, FOLATE, FERRITIN, TIBC, IRON, RETICCTPCT in the last 72 hours. ?Sepsis Labs: ?Recent Labs  ?Lab 08/31/21 ?1343 08/31/21 ?1515 08/31/21 ?2317 09/01/21 ?0237 09/02/21 ?2725  ?PROCALCITON  --   --  72.82 65.87 29.66  ?LATICACIDVEN 2.6*  2.5* 1.8  --   --   ? ? ?Recent Results (from the past 240 hour(s))  ?Blood Culture (routine x 2)     Status: Abnormal (Preliminary result)  ? Collection Time: 08/31/21  1:43 PM  ? Specimen: BLOOD  ?Res

## 2021-09-03 NOTE — Progress Notes (Addendum)
? ?[facility] Infectious Diseases Follow Up Note ? ?Patient Identification: ?Patient Name: Steven [name] MRN: [id number] [place] Date: 08/31/2021 12:33 PM ?Age: 71 y.o.Today's Date: 09/03/2021 ? ?Reason for Visit: GNR bacteremia ? ?Principal Problem: ?  Sepsis ([place]) ?Active Problems: ?  Severe sepsis ([place]) ?  Bacteremia ?  Hepatic abscess ? ?Antibiotics:  ?Vancomycin 3/28 ?Zosyn 3/28-3/29 ?Cefepime 3/28-3/30 ?Metronidazole 3/28, 3/30 ?  ?Lines/Hardware: Rt TKA ? ?Interval Events: Continues to be afebrile, leukocytosis is downtrending.  HIDA scan with no abnormality ? ? ?Assessment ?# GNR bacteremia ( Enterobacter cloacae complex and E coli, no ESBL) in the context of recent IR hepatic procedure ?  ?# ? hepatic abscess in the setting of recent IR intervention for possible solitary rt sided hepatic carcinoma  ?- s/p percutaneous bland embolization and fluoroscopic guided microwave ablation on 3/15 by IR with a curative intent  ?- IR  " gas at the ablation site is expected.  Sounds like patient is getting better but I am a little concerned about the gallbladder.  Could consider a chole drain if needed but would want to get  a HIDA scan first to make sure cystic duct is obstructed" ?- HIDA scan 3/31 WNL ?  ?# Leukocytosis/AKI - improving  ?  ?# H/o Hepatitis C - treated with Harvoni ( 2 months of harvoni per patient) in 2018 with SVR ?# H/o alcohol use - last use 10-11 months ago per patient ?# Opioid dependence ?# Smoking - 1 pack every day, counseled  ? ?Recommendation ?Although above diagnostic work up did not definitely confirm liver abscess, I still suspect hepatic source for polymicrobial bacteremia ? ?Would recommend to continue IV cefepime and PO Metronidazole pending final cultures and will plan to switch to ciprofloxacin  ( QTC 435)early next week for 2 weeks course ? ?Fu final blood cultures  ? ?Dr Juleen China will follow cultures over the weekend. Please call  with questions  ? ?Rest of the management as per the primary team. ?Thank you for the consult. Please page with pertinent questions or concerns. ? ?______________________________________________________________________ ?Subjective ?patient seen and examined at the bedside.  ?Denies complaints  ? ?Vitals ?BP 126/64 (BP Location: Left Arm)   Pulse 82   Temp 97.7 ?F (36.5 ?C)   Resp 19   Ht '5\' 10"'$  (1.778 m)   Wt 79.5 kg   SpO2 96%   BMI 25.15 kg/m?  ? ?  ?Physical Exam ?Constitutional: Sitting up in the bed, not in acute distress ?   Comments:  ? ?Cardiovascular:  ?   Rate and Rhythm: Normal rate and regular rhythm.  ?   Heart sounds:  ? ?Pulmonary:  ?   Effort: Pulmonary effort is normal.  ?   Comments:  ? ?Abdominal:  ?   Palpations: Abdomen is soft.  ?   Tenderness: Nontender and nondistended ? ?Musculoskeletal:     ?   General: No swelling or tenderness.  ? ?Skin: ?   Comments:  ? ?Neurological:  ?   General: Grossly nonfocal, awake alert and oriented ? ?Psychiatric:     ?   Mood and Affect: Mood normal.  ? ?Pertinent Microbiology ?Results for orders placed or performed during the hospital encounter of 08/31/21  ?Blood Culture (routine x 2)     Status: Abnormal (Preliminary result)  ? Collection Time: 08/31/21  1:43 PM  ? Specimen: BLOOD  ?Result Value Ref Range Status  ? Specimen Description   Final  ?  BLOOD LEFT ANTECUBITAL ?Performed at  [facility], [address] [place], [place] [address] ?  ? Special Requests   Final  ?  Blood Culture adequate volume BOTTLES DRAWN AEROBIC AND ANAEROBIC ?Performed at [facility], [address] [place], [place] [address] ?  ? Culture  Setup Time   Final  ?  GRAM NEGATIVE RODS BOTTLES DRAWN AEROBIC ONLY ANAEROBIC BOTTLE ONLY ?Gram Stain Report Called to,Read Back By and Verified With: B SHOOK RN [phone] K [place] ?CRITICAL RESULT CALLED TO, READ BACK BY AND VERIFIED WITH: Lindell Spar [facility] 09/01/21 @ 1556 BY DRT ?Performed at [facility], [address],  [place], [place] [address] ?  ? Culture ESCHERICHIA COLI ?ENTEROBACTER CLOACAE ? (A)  Final  ? Report Status PENDING  Incomplete  ? Organism ID, Bacteria ESCHERICHIA COLI  Final  ? Organism ID, Bacteria ENTEROBACTER CLOACAE  Final  ?    Susceptibility  ? Enterobacter cloacae - MIC*  ?  CEFAZOLIN >=64 RESISTANT Resistant   ?  CEFEPIME <=0.12 SENSITIVE Sensitive   ?  CEFTAZIDIME <=1 SENSITIVE Sensitive   ?  CIPROFLOXACIN <=0.25 SENSITIVE Sensitive   ?  GENTAMICIN <=1 SENSITIVE Sensitive   ?  IMIPENEM <=0.25 SENSITIVE Sensitive   ?  TRIMETH/SULFA <=20 SENSITIVE Sensitive   ?  PIP/TAZO <=4 SENSITIVE Sensitive   ?  * ENTEROBACTER CLOACAE  ? Escherichia coli - MIC*  ?  AMPICILLIN <=2 SENSITIVE Sensitive   ?  CEFAZOLIN <=4 SENSITIVE Sensitive   ?  CEFEPIME <=0.12 SENSITIVE Sensitive   ?  CEFTAZIDIME <=1 SENSITIVE Sensitive   ?  CEFTRIAXONE <=0.25 SENSITIVE Sensitive   ?  CIPROFLOXACIN <=0.25 SENSITIVE Sensitive   ?  GENTAMICIN <=1 SENSITIVE Sensitive   ?  IMIPENEM <=0.25 SENSITIVE Sensitive   ?  TRIMETH/SULFA <=20 SENSITIVE Sensitive   ?  AMPICILLIN/SULBACTAM <=2 SENSITIVE Sensitive   ?  PIP/TAZO <=4 SENSITIVE Sensitive   ?  * ESCHERICHIA COLI  ?Blood Culture (routine x 2)     Status: Abnormal (Preliminary result)  ? Collection Time: 08/31/21  1:43 PM  ? Specimen: BLOOD  ?Result Value Ref Range Status  ? Specimen Description   Final  ?  BLOOD BLOOD LEFT FOREARM ?Performed at [facility], [address], [place], [place] [address] ?  ? Special Requests   Final  ?  BOTTLES DRAWN AEROBIC AND ANAEROBIC Blood Culture adequate volume ?Performed at [facility], [address], [place], [place] [address] ?  ? Culture  Setup Time   Final  ?  GRAM NEGATIVE RODS ?CRITICAL VALUE NOTED.  VALUE IS CONSISTENT WITH PREVIOUSLY REPORTED AND CALLED VALUE. ?BOTH AEROBIC AND ANAEROBIC BOTTLES ?Performed at [facility], [address], [place], [place] [address] ?  ? Culture (A)  Final  ?  ENTEROBACTER CLOACAE ?SUSCEPTIBILITIES PERFORMED ON PREVIOUS  CULTURE WITHIN THE LAST 5 DAYS. ?Performed at [facility], [place] [address], [place], [place] [address] ?  ? Report Status PENDING  Incomplete  ?Blood Culture ID Panel (Reflexed)     Status: Abnormal  ? Collection Time: 08/31/21  1:43 PM  ?Result Value Ref Range Status  ? Enterococcus faecalis NOT DETECTED NOT DETECTED Final  ? Enterococcus Faecium NOT DETECTED NOT DETECTED Final  ? Listeria monocytogenes NOT DETECTED NOT DETECTED Final  ? Staphylococcus species NOT DETECTED NOT DETECTED Final  ? Staphylococcus aureus (BCID) NOT DETECTED NOT DETECTED Final  ? Staphylococcus epidermidis NOT DETECTED NOT DETECTED Final  ? Staphylococcus lugdunensis NOT DETECTED NOT DETECTED Final  ? Streptococcus species  NOT DETECTED NOT DETECTED Final  ? Streptococcus agalactiae NOT DETECTED NOT DETECTED Final  ? Streptococcus pneumoniae NOT DETECTED NOT DETECTED Final  ? Streptococcus pyogenes NOT DETECTED NOT DETECTED Final  ? A.calcoaceticus-baumannii NOT DETECTED NOT DETECTED Final  ? Bacteroides fragilis NOT DETECTED NOT DETECTED Final  ? Enterobacterales DETECTED (A) NOT DETECTED Final  ?  Comment: CRITICAL RESULT CALLED TO, READ BACK BY AND VERIFIED WITH: ?Lindell Spar [facility] 09/01/21 @ 1559 BY DRT ?  ? Enterobacter cloacae complex DETECTED (A) NOT DETECTED Final  ?  Comment: CRITICAL RESULT CALLED TO, READ BACK BY AND VERIFIED WITH: ?Lindell Spar [facility] 09/01/21 @ 1559 BY DRT ?  ? Escherichia coli DETECTED (A) NOT DETECTED Final  ?  Comment: CRITICAL RESULT CALLED TO, READ BACK BY AND VERIFIED WITH: ?Lindell Spar [facility] 09/01/21 @ 1559 BY DRT ?  ? Klebsiella aerogenes NOT DETECTED NOT DETECTED Final  ? Klebsiella oxytoca NOT DETECTED NOT DETECTED Final  ? Klebsiella pneumoniae NOT DETECTED NOT DETECTED Final  ? Proteus species NOT DETECTED NOT DETECTED Final  ? Salmonella species NOT DETECTED NOT DETECTED Final  ? Serratia marcescens NOT DETECTED NOT DETECTED Final  ? Haemophilus influenzae NOT DETECTED NOT DETECTED Final  ?  Neisseria meningitidis NOT DETECTED NOT DETECTED Final  ? Pseudomonas aeruginosa NOT DETECTED NOT DETECTED Final  ? Stenotrophomonas maltophilia NOT DETECTED NOT DETECTED Final  ? Candida albicans NOT DETECTED NOT DETECT

## 2021-09-04 LAB — BASIC METABOLIC PANEL
Anion gap: 5 (ref 5–15)
BUN: 23 mg/dL (ref 8–23)
CO2: 26 mmol/L (ref 22–32)
Calcium: 8.5 mg/dL — ABNORMAL LOW (ref 8.9–10.3)
Chloride: 106 mmol/L (ref 98–111)
Creatinine, Ser: 1.14 mg/dL (ref 0.61–1.24)
GFR, Estimated: >60 mL/min (ref >60)
Glucose, Bld: 92 mg/dL (ref 70–99)
Potassium: 4.4 mmol/L (ref 3.5–5.1)
Sodium: 137 mmol/L (ref 135–145)

## 2021-09-04 LAB — CULTURE, BLOOD (ROUTINE X 2)
Special Requests: ADEQUATE
Special Requests: ADEQUATE

## 2021-09-04 LAB — MAGNESIUM: Magnesium: 2 mg/dL (ref 1.7–2.4)

## 2021-09-04 LAB — CBC
HCT: 32.6 % — ABNORMAL LOW (ref 39.0–52.0)
Hemoglobin: 10.9 g/dL — ABNORMAL LOW (ref 13.0–17.0)
MCH: 31.2 pg (ref 26.0–34.0)
MCHC: 33.4 g/dL (ref 30.0–36.0)
MCV: 93.4 fL (ref 80.0–100.0)
Platelets: 232 10*3/uL (ref 150–400)
RBC: 3.49 MIL/uL — ABNORMAL LOW (ref 4.22–5.81)
RDW: 13.3 % (ref 11.5–15.5)
WBC: 11.2 10*3/uL — ABNORMAL HIGH (ref 4.0–10.5)
nRBC: 0 % (ref 0.0–0.2)

## 2021-09-04 LAB — PHOSPHORUS: Phosphorus: 3.2 mg/dL (ref 2.5–4.6)

## 2021-09-04 MED ORDER — SODIUM CHLORIDE 0.9 % IV SOLN
2.0000 g | Freq: Three times a day (TID) | INTRAVENOUS | Status: DC
  Administered 2021-09-04 – 2021-09-06 (×6): 2 g via INTRAVENOUS
  Filled 2021-09-04 (×6): qty 2

## 2021-09-04 NOTE — Progress Notes (Signed)
?PROGRESS NOTE ? ? ? ?AUBURN HERT  MGQ:[id number] DOB: 08-03-1950 DOA: 08/31/2021 ? ?PCP: [name], MD  ? ? ?Brief Narrative:  ?This 71 years old male with PMH significant for hepatocellular carcinoma, hepatitis C, treated with Harvoni, EtOH abuse, hypertension, AAA presented in the [facility] ED.  Patient presented in the ED with intermittent fevers of chills and rigors.  Patient underwent microwave guided ablation of a solitary right-sided liver tumor. ?Patient was admitted for septic shock requiring Levophed support and initially admitted in the ICU.Marland Kitchen  He is successfully weaned off of Levophed.  Ultrasound RUQ showed abnormality in the right hepatic lobe but no clear focus of fluid or abscess.  CTA chest showed some concern for pneumonia.  Procalcitonin elevated on admission.  Blood cultures growing gram-negative rods.  Infectious disease consulted, antibiotics changed to cefepime IV and oral metronidazole.  IR evaluation for possible hepatic abscess. ? ?Assessment & Plan: ?  ?Principal Problem: ?  Sepsis ([place]) ?Active Problems: ?  Severe sepsis ([place]) ?  Bacteremia ?  Hepatic abscess ? ?Septic shock secondary to E. coli bacteremia: ?Patient presented with tachycardia, tachypnea, hypotension requiring Levophed support. ?Patient is now off Levophed. Blood cultures growing E. Coli / Enterobacter. ?Antibiotics changed to Zosyn.  Infectious disease consulted.  Antibiotic changed to cefepime IV and oral metronidazole. ?Blood pressure has improved, shock resolved. ?Lactic acidosis resolved with IV hydration. ?Procalcitonin 72> 65> 29 ?HIDA scan negative.  ID still suspect hepatic source for polymicrobial bacteremia. ?Continue IV cefepime and p.o. metronidazole pending final cultures. ? ?Acute kidney injury: > Resolved. ?Presented with serum creatinine 2.4, baseline creatinine normal. ?Could be secondary to hypotension. ?Avoid nephrotoxic medications ?Serum creatinine improving 2.42> 2.03> 1.94>  1.40>1.35 >1.14 ?Continue IV hydration. ? ?Acute hepatic failure: ?[facility] ?Continue to monitor LFTs. ?IR recommended HIDA scan which is normal. ?Follow-up IR for possible hepatic abscess. ?IR states nothing else to offer.  Continue antibiotics. ? ?Infrarenal AAA: ?Continue outpatient follow-up ? ?Opioid abuse: ?Continue Suboxone. ? ?EtOH dependence: ?Continue thiamine, folate. ? ?COPD: stable, Continue regular inhaler ? ? ?DVT prophylaxis: SCDs ?Code Status: Full code. ?Family Communication: No family at bed side. ?Disposition Plan: ?Status is: Inpatient ?Remains inpatient appropriate because: Admitted for septic shock secondary to hepatic abscess.  Initially requiring Levophed support,  now off Levophed, remains on IV antibiotics.  Cultures pending. ?  ? ?Consultants:  ?PCCM ? ?Procedures:  CTA /P ?Antimicrobials: ?Anti-infectives (From admission, onward)  ? ? Start     Dose/Rate Route Frequency Ordered Stop  ? 09/02/21 1200  metroNIDAZOLE (FLAGYL) tablet 500 mg       ? 500 mg Oral Every 12 hours 09/02/21 1108    ? 09/01/21 1800  cefTRIAXone (ROCEPHIN) 2 g in sodium chloride 0.9 % 100 mL IVPB  Status:  Discontinued       ? 2 g ?200 mL/hr over 30 Minutes Intravenous Every 24 hours 09/01/21 1628 09/01/21 1721  ? 09/01/21 1800  ceFEPIme (MAXIPIME) 2 g in sodium chloride 0.9 % 100 mL IVPB       ? 2 g ?200 mL/hr over 30 Minutes Intravenous Every 12 hours 09/01/21 1721    ? 09/01/21 1500  vancomycin (VANCOREADY) IVPB 750 mg/150 mL  Status:  Discontinued       ? 750 mg ?150 mL/hr over 60 Minutes Intravenous Every 24 hours 08/31/21 2017 09/01/21 0801  ? 09/01/21 0200  ceFEPIme (MAXIPIME) 2 g in sodium chloride 0.9 % 100 mL IVPB  Status:  Discontinued       ? 2 g ?200 mL/hr over 30 Minutes Intravenous Every 12 hours 08/31/21 2017 09/01/21 0010  ? 09/01/21 0200  piperacillin-tazobactam (ZOSYN) IVPB 3.375 g  Status:  Discontinued       ? 3.375 g ?12.5 mL/hr over 240 Minutes Intravenous  Every 8 hours 09/01/21 0024 09/01/21 1628  ? 08/31/21 1415  vancomycin (VANCOREADY) IVPB 1500 mg/300 mL  Status:  Discontinued       ? 1,500 mg ?150 mL/hr over 120 Minutes Intravenous NOW 08/31/21 1409 09/01/21 0739  ? 08/31/21 1330  ceFEPIme (MAXIPIME) 2 g in sodium chloride 0.9 % 100 mL IVPB       ? 2 g ?200 mL/hr over 30 Minutes Intravenous  Once 08/31/21 1326 08/31/21 1445  ? 08/31/21 1330  metroNIDAZOLE (FLAGYL) IVPB 500 mg       ? 500 mg ?100 mL/hr over 60 Minutes Intravenous  Once 08/31/21 1326 08/31/21 1444  ? 08/31/21 1330  vancomycin (VANCOCIN) IVPB 1000 mg/200 mL premix  Status:  Discontinued       ? 1,000 mg ?200 mL/hr over 60 Minutes Intravenous  Once 08/31/21 1326 08/31/21 1621  ? ?  ?  ? ?Subjective: ?Patient was seen and examined at bedside.  Overnight events noted . ?Patient reports feeling better.  He denies any fever, chills denies any pain. ?He underwent HIDA scan which is completely normal. ? ? ?Objective: ?Vitals:  ? 09/03/21 1414 09/03/21 2026 09/04/21 0354 09/04/21 0355  ?BP: 126/64 140/69  (!) 152/67  ?Pulse: 82 79  76  ?Resp: '19 18  20  '$ ?Temp: 97.7 ?F (36.5 ?C) 98.3 ?F (36.8 ?C)  98.2 ?F (36.8 ?C)  ?TempSrc:    Oral  ?SpO2: 96% 100%  96%  ?Weight:   79.4 kg   ?Height:      ? ? ?Intake/Output Summary (Last 24 hours) at 09/04/2021 1041 ?Last data filed at 09/03/2021 1813 ?Gross per 24 hour  ?Intake 120 ml  ?Output 300 ml  ?Net -180 ml  ? ? ?Filed Weights  ? 08/31/21 1233 09/02/21 0458 09/04/21 0354  ?Weight: 74.8 kg 79.5 kg 79.4 kg  ? ? ?Examination: ? ?General exam: Appears deconditioned, comfortable, not in any acute distress. ?Respiratory system: CTA bilaterally, no wheezing, no crackles, normal respiratory effort. ?Cardiovascular system: S1 & S2 heard, regular rate and rhythm, no murmur. ?Gastrointestinal system: Abdomen is soft, non tender, non distended, BS+ ?Central nervous system: Alert and oriented x 3. No focal neurological deficits. ?Extremities: No edema, no cyanosis, no  clubbing ?Skin: No rashes, lesions or ulcers ?Psychiatry: Judgement and insight appear normal. Mood & affect appropriate.  ? ? ? ?Data Reviewed: I have personally reviewed following labs and imaging studies ? ?CBC: ?Recent Labs  ?Lab 08/31/21 ?1343 08/31/21 ?2317 09/01/21 ?0237 09/02/21 ?3545 09/03/21 ?6256 09/04/21 ?0510  ?WBC 17.0* 36.5* 32.5* 24.3* 17.2* 11.2*  ?NEUTROABS 16.7*  --   --   --   --   --   ?HGB 11.6* 10.3* 10.1* 10.5* 11.1* 10.9*  ?HCT 35.5* 30.6* 30.0* 30.4* 33.2* 32.6*  ?MCV 93.9 94.2 93.8 92.7 94.3 93.4  ?PLT 274 292 230 230 217 232  ? ? ?Basic Metabolic Panel: ?Recent Labs  ?Lab 08/31/21 ?2317 09/01/21 ?0237 09/02/21 ?3893 09/03/21 ?7342 09/04/21 ?0510  ?NA 134* 134* 134* 134* 137  ?K 3.8 4.1 3.7 4.4 4.4  ?CL 103 104 104 105 106  ?CO2 '22 24 24 22 26  '$ ?GLUCOSE 132* 120* 101* 90  92  ?BUN 30* 31* 33* 28* 23  ?CREATININE 2.03* 1.94* 1.40* 1.35* 1.14  ?CALCIUM 7.7* 7.8* 8.1* 8.4* 8.5*  ?MG  --  1.8 2.0  --  2.0  ?PHOS  --  3.9 2.5  --  3.2  ? ? ?GFR: ?Estimated Creatinine Clearance: 63.1 mL/min (by C-G formula based on SCr of 1.14 mg/dL). ?Liver Function Tests: ?Recent Labs  ?Lab 08/31/21 ?1343 09/02/21 ?2297  ?AST 241* 74*  ?ALT 165* 146*  ?ALKPHOS 98 78  ?BILITOT 2.2* 0.6  ?PROT 6.8 5.7*  ?ALBUMIN 3.1* 2.5*  ? ? ?No results for input(s): LIPASE, AMYLASE in the last 168 hours. ?Recent Labs  ?Lab 08/31/21 ?1343  ?AMMONIA 12  ? ? ?Coagulation Profile: ?Recent Labs  ?Lab 08/31/21 ?1343  ?INR 1.2  ? ? ?Cardiac Enzymes: ?Recent Labs  ?Lab 08/31/21 ?1343  ?CKTOTAL 226  ? ? ?BNP (last 3 results) ?No results for input(s): PROBNP in the last 8760 hours. ?HbA1C: ?No results for input(s): HGBA1C in the last 72 hours. ?CBG: ?No results for input(s): GLUCAP in the last 168 hours. ?Lipid Profile: ?No results for input(s): CHOL, HDL, LDLCALC, TRIG, CHOLHDL, LDLDIRECT in the last 72 hours. ?Thyroid Function Tests: ?No results for input(s): TSH, T4TOTAL, FREET4, T3FREE, THYROIDAB in the last 72 hours. ?Anemia  Panel: ?No results for input(s): VITAMINB12, FOLATE, FERRITIN, TIBC, IRON, RETICCTPCT in the last 72 hours. ?Sepsis Labs: ?Recent Labs  ?Lab 08/31/21 ?1343 08/31/21 ?1515 08/31/21 ?2317 09/01/21 ?0237 09/02/21 ?9892  ?PROCAL

## 2021-09-04 NOTE — Plan of Care (Signed)

## 2021-09-04 NOTE — Progress Notes (Signed)
PHARMACY NOTE:  ANTIMICROBIAL RENAL DOSAGE ADJUSTMENT ? ?Current antimicrobial regimen includes a mismatch between antimicrobial dosage and estimated renal function.  As per policy approved by the Pharmacy & Therapeutics and Medical Executive Committees, the antimicrobial dosage will be adjusted accordingly. ? ?Current antimicrobial dosage:  Cefepime 2 g IV q12h ? ?Indication: E.coli bacteremia ? ?Renal Function: SCr has improved ? ?Estimated Creatinine Clearance: 63.1 mL/min (by C-G formula based on SCr of 1.14 mg/dL). ? ?Antimicrobial dosage has been changed to:  Cefepime 2 g IV q8h ? ?Steven [name], [facility] ?09/04/2021 12:55 PM ? ? ?  ? ?

## 2021-09-05 LAB — CBC
HCT: 33.7 % — ABNORMAL LOW (ref 39.0–52.0)
Hemoglobin: 11.2 g/dL — ABNORMAL LOW (ref 13.0–17.0)
MCH: 31.2 pg (ref 26.0–34.0)
MCHC: 33.2 g/dL (ref 30.0–36.0)
MCV: 93.9 fL (ref 80.0–100.0)
Platelets: 237 10*3/uL (ref 150–400)
RBC: 3.59 MIL/uL — ABNORMAL LOW (ref 4.22–5.81)
RDW: 13.4 % (ref 11.5–15.5)
WBC: 12.1 10*3/uL — ABNORMAL HIGH (ref 4.0–10.5)
nRBC: 0 % (ref 0.0–0.2)

## 2021-09-05 LAB — PROCALCITONIN: Procalcitonin: 2.26 ng/mL

## 2021-09-05 MED ORDER — PANTOPRAZOLE SODIUM 40 MG PO TBEC
40.0000 mg | DELAYED_RELEASE_TABLET | Freq: Every day | ORAL | Status: DC
  Administered 2021-09-05: 40 mg via ORAL
  Filled 2021-09-05: qty 1

## 2021-09-05 NOTE — Progress Notes (Signed)
? ?PROGRESS NOTE ? ? ? ?Steven [name]  JGO:[id number] DOB: 06/05/1951 DOA: 08/31/2021 ? ?PCP: [name], MD  ? ? ?Brief Narrative:  ?This 71 years old male with PMH significant for hepatocellular carcinoma, hepatitis C, treated with Harvoni, EtOH abuse, hypertension, AAA presented in the [facility] ED.  Patient presented in the ED with intermittent fevers of chills and rigors.  Patient underwent microwave guided ablation of a solitary right-sided liver tumor. ?Patient was admitted for septic shock requiring Levophed support and initially admitted in the ICU.Marland Kitchen  He is successfully weaned off of Levophed.  Ultrasound RUQ showed abnormality in the right hepatic lobe but no clear focus of fluid or abscess.  CTA chest showed some concern for pneumonia.  Procalcitonin elevated on admission.  Blood cultures growing gram-negative rods.  Infectious disease consulted, antibiotics changed to cefepime IV and oral metronidazole.  IR evaluation for possible hepatic abscess. ? ?Assessment & Plan: ?  ?Principal Problem: ?  Sepsis ([place]) ?Active Problems: ?  Severe sepsis ([place]) ?  Bacteremia ?  Hepatic abscess ? ?Septic shock secondary to E. coli bacteremia: ?Patient presented with tachycardia, tachypnea, hypotension requiring Levophed support. ?Patient is now off Levophed. Blood cultures growing E. Coli / Enterobacter. ?Antibiotics changed to Zosyn.  Infectious disease consulted.  Antibiotic changed to cefepime IV and oral metronidazole. ?Blood pressure has improved, shock resolved. ?Lactic acidosis resolved with IV hydration. ?Procalcitonin 72> 65> 29 > 2.26 ?HIDA scan negative.  ID still suspect hepatic source for polymicrobial bacteremia. ?Continue IV cefepime and p.o. metronidazole pending final cultures. ? ?Acute kidney injury: > Resolved. ?Presented with serum creatinine 2.4, baseline creatinine normal. ?Could be secondary to hypotension. ?Avoid nephrotoxic medications ?Serum creatinine improving 2.42> 2.03>  1.94> 1.40>1.35 >1.14 ?Continue IV hydration. ? ?Acute hepatic failure: ?[facility] ?Continue to monitor LFTs. ?IR recommended HIDA scan which is normal. ?Follow-up IR for possible hepatic abscess. ?IR states nothing else to offer.  Continue antibiotics as per ID ? ?Infrarenal AAA: ?Continue outpatient follow-up ? ?Opioid abuse: ?Continue Suboxone. ? ?EtOH dependence: ?Continue thiamine, folate. ? ?COPD: stable, Continue regular inhaler. ? ? ?DVT prophylaxis: SCDs ?Code Status: Full code. ?Family Communication: No family at bed side. ?Disposition Plan: ?Status is: Inpatient ?Remains inpatient appropriate because: Admitted for septic shock secondary to hepatic abscess.  Initially requiring Levophed support,  now off Levophed, remains on IV antibiotics.  Cultures pending.ID following ? ?Anticipated Discharge home in 1-2 days. ?  ? ?Consultants:  ?PCCM ? ?Procedures:  CTA /P ?Antimicrobials: ?Anti-infectives (From admission, onward)  ? ? Start     Dose/Rate Route Frequency Ordered Stop  ? 09/04/21 1400  ceFEPIme (MAXIPIME) 2 g in sodium chloride 0.9 % 100 mL IVPB       ? 2 g ?200 mL/hr over 30 Minutes Intravenous Every 8 hours 09/04/21 1255    ? 09/02/21 1200  metroNIDAZOLE (FLAGYL) tablet 500 mg       ? 500 mg Oral Every 12 hours 09/02/21 1108    ? 09/01/21 1800  cefTRIAXone (ROCEPHIN) 2 g in sodium chloride 0.9 % 100 mL IVPB  Status:  Discontinued       ? 2 g ?200 mL/hr over 30 Minutes Intravenous Every 24 hours 09/01/21 1628 09/01/21 1721  ? 09/01/21 1800  ceFEPIme (MAXIPIME) 2 g in sodium chloride 0.9 % 100 mL IVPB  Status:  Discontinued       ? 2 g ?200 mL/hr over 30 Minutes Intravenous Every 12 hours 09/01/21  1721 09/04/21 1255  ? 09/01/21 1500  vancomycin (VANCOREADY) IVPB 750 mg/150 mL  Status:  Discontinued       ? 750 mg ?150 mL/hr over 60 Minutes Intravenous Every 24 hours 08/31/21 2017 09/01/21 0801  ? 09/01/21 0200  ceFEPIme (MAXIPIME) 2 g in sodium chloride 0.9 % 100  mL IVPB  Status:  Discontinued       ? 2 g ?200 mL/hr over 30 Minutes Intravenous Every 12 hours 08/31/21 2017 09/01/21 0010  ? 09/01/21 0200  piperacillin-tazobactam (ZOSYN) IVPB 3.375 g  Status:  Discontinued       ? 3.375 g ?12.5 mL/hr over 240 Minutes Intravenous Every 8 hours 09/01/21 0024 09/01/21 1628  ? 08/31/21 1415  vancomycin (VANCOREADY) IVPB 1500 mg/300 mL  Status:  Discontinued       ? 1,500 mg ?150 mL/hr over 120 Minutes Intravenous NOW 08/31/21 1409 09/01/21 0739  ? 08/31/21 1330  ceFEPIme (MAXIPIME) 2 g in sodium chloride 0.9 % 100 mL IVPB       ? 2 g ?200 mL/hr over 30 Minutes Intravenous  Once 08/31/21 1326 08/31/21 1445  ? 08/31/21 1330  metroNIDAZOLE (FLAGYL) IVPB 500 mg       ? 500 mg ?100 mL/hr over 60 Minutes Intravenous  Once 08/31/21 1326 08/31/21 1444  ? 08/31/21 1330  vancomycin (VANCOCIN) IVPB 1000 mg/200 mL premix  Status:  Discontinued       ? 1,000 mg ?200 mL/hr over 60 Minutes Intravenous  Once 08/31/21 1326 08/31/21 1621  ? ?  ?  ? ?Subjective: ?Patient was seen and examined at bedside.  Overnight events noted . ?Patient reports feeling very much improved. Denies any fever, chills, denies any pain. ?He underwent HIDA scan which is completely normal. ? ? ?Objective: ?Vitals:  ? 09/04/21 1405 09/04/21 2019 09/05/21 0341 09/05/21 0500  ?BP: 137/64 (!) 151/70 (!) 163/80   ?Pulse: 80 76 75   ?Resp: '16 16 16   '$ ?Temp: 98 ?F (36.7 ?C) 98.4 ?F (36.9 ?C) 98 ?F (36.7 ?C)   ?TempSrc: Oral Oral    ?SpO2: 97% 95% 98%   ?Weight:    78.3 kg  ?Height:      ? ? ?Intake/Output Summary (Last 24 hours) at 09/05/2021 1108 ?Last data filed at 09/05/2021 1000 ?Gross per 24 hour  ?Intake 4457.33 ml  ?Output 1250 ml  ?Net 3207.33 ml  ? ?Filed Weights  ? 09/02/21 0458 09/04/21 0354 09/05/21 0500  ?Weight: 79.5 kg 79.4 kg 78.3 kg  ? ? ?Examination: ? ?General exam: Appears comfortable, not in any acute distress.  Deconditioned ?Respiratory system: CTA bilaterally, no wheezing, no crackles, normal respiratory  effort. ?Cardiovascular system: S1 & S2 heard, regular rate and rhythm, no murmur. ?Gastrointestinal system: Abdomen is soft, non tender, non distended, BS+ ?Central nervous system: Alert and oriented x 3. No focal neurological deficits. ?Extremities: No edema, no cyanosis, no clubbing ?Skin: No rashes, lesions or ulcers ?Psychiatry: Judgement and insight appear normal. Mood & affect appropriate.  ? ? ? ?Data Reviewed: I have personally reviewed following labs and imaging studies ? ?CBC: ?Recent Labs  ?Lab 08/31/21 ?1343 08/31/21 ?2317 09/01/21 ?0237 09/02/21 ?3710 09/03/21 ?6269 09/04/21 ?0510 09/05/21 ?0541  ?WBC 17.0*   < > 32.5* 24.3* 17.2* 11.2* 12.1*  ?NEUTROABS 16.7*  --   --   --   --   --   --   ?HGB 11.6*   < > 10.1* 10.5* 11.1* 10.9* 11.2*  ?HCT 35.5*   < >  30.0* 30.4* 33.2* 32.6* 33.7*  ?MCV 93.9   < > 93.8 92.7 94.3 93.4 93.9  ?PLT 274   < > 230 230 217 232 237  ? < > = values in this interval not displayed.  ? ?Basic Metabolic Panel: ?Recent Labs  ?Lab 08/31/21 ?2317 09/01/21 ?0237 09/02/21 ?7939 09/03/21 ?0300 09/04/21 ?0510  ?NA 134* 134* 134* 134* 137  ?K 3.8 4.1 3.7 4.4 4.4  ?CL 103 104 104 105 106  ?CO2 '22 24 24 22 26  '$ ?GLUCOSE 132* 120* 101* 90 92  ?BUN 30* 31* 33* 28* 23  ?CREATININE 2.03* 1.94* 1.40* 1.35* 1.14  ?CALCIUM 7.7* 7.8* 8.1* 8.4* 8.5*  ?MG  --  1.8 2.0  --  2.0  ?PHOS  --  3.9 2.5  --  3.2  ? ?GFR: ?Estimated Creatinine Clearance: 63.1 mL/min (by C-G formula based on SCr of 1.14 mg/dL). ?Liver Function Tests: ?Recent Labs  ?Lab 08/31/21 ?1343 09/02/21 ?9233  ?AST 241* 74*  ?ALT 165* 146*  ?ALKPHOS 98 78  ?BILITOT 2.2* 0.6  ?PROT 6.8 5.7*  ?ALBUMIN 3.1* 2.5*  ? ?No results for input(s): LIPASE, AMYLASE in the last 168 hours. ?Recent Labs  ?Lab 08/31/21 ?1343  ?AMMONIA 12  ? ?Coagulation Profile: ?Recent Labs  ?Lab 08/31/21 ?1343  ?INR 1.2  ? ?Cardiac Enzymes: ?Recent Labs  ?Lab 08/31/21 ?1343  ?CKTOTAL 226  ? ?BNP (last 3 results) ?No results for input(s): PROBNP in the last 8760  hours. ?HbA1C: ?No results for input(s): HGBA1C in the last 72 hours. ?CBG: ?No results for input(s): GLUCAP in the last 168 hours. ?Lipid Profile: ?No results for input(s): CHOL, HDL, LDLCALC, TRIG, CHOLHDL, LDLDI

## 2021-09-05 NOTE — Progress Notes (Signed)
PHARMACIST - PHYSICIAN COMMUNICATION ? ?DR:   Dwyane Dee ? ?CONCERNING: IV to Oral Route Change Policy ? ?RECOMMENDATION: ?This patient is receiving pantoprazole by the intravenous route.  Based on criteria approved by the Pharmacy and Therapeutics Committee, the intravenous medication(s) is/are being converted to the equivalent oral dose form(s). ? ? ?DESCRIPTION: ?These criteria include: ?The patient is eating (either orally or via tube) and/or has been taking other orally administered medications for a least 24 hours ?The patient has no evidence of active gastrointestinal bleeding or impaired GI absorption (gastrectomy, short bowel, patient on TNA or NPO). ? ?If you have questions about this conversion, please contact the Pharmacy Department  ?'[]'$   [phone] )  [facility] ?'[]'$   [phone] )  [facility] ?'[]'$   [phone] )  [facility] ?'[]'$   [phone] )  [facility] ?'[x]'$   [phone] )  [facility]  ? ?Lenis Noon, [facility] ?09/05/2021 7:59 AM ? ? ?

## 2021-09-06 DIAGNOSIS — D72829 Elevated white blood cell count, unspecified: Secondary | ICD-10-CM

## 2021-09-06 LAB — CBC
HCT: 35.3 % — ABNORMAL LOW (ref 39.0–52.0)
Hemoglobin: 11.7 g/dL — ABNORMAL LOW (ref 13.0–17.0)
MCH: 30.9 pg (ref 26.0–34.0)
MCHC: 33.1 g/dL (ref 30.0–36.0)
MCV: 93.1 fL (ref 80.0–100.0)
Platelets: 294 10*3/uL (ref 150–400)
RBC: 3.79 MIL/uL — ABNORMAL LOW (ref 4.22–5.81)
RDW: 13.3 % (ref 11.5–15.5)
WBC: 14.5 10*3/uL — ABNORMAL HIGH (ref 4.0–10.5)
nRBC: 0 % (ref 0.0–0.2)

## 2021-09-06 MED ORDER — CIPROFLOXACIN HCL 750 MG PO TABS: 750.0000 mg | ORAL_TABLET | Freq: Two times a day (BID) | ORAL | 0 refills | Status: AC

## 2021-09-06 MED ORDER — METRONIDAZOLE 500 MG PO TABS: 500.0000 mg | ORAL_TABLET | Freq: Two times a day (BID) | ORAL | 0 refills | Status: AC

## 2021-09-06 MED ORDER — CIPROFLOXACIN HCL 500 MG PO TABS: 750.0000 mg | ORAL_TABLET | Freq: Two times a day (BID) | ORAL | Status: DC

## 2021-09-06 NOTE — Evaluation (Signed)
Physical Therapy Evaluation ?Patient Details ?Name: Steven [name] ?MRN: [id number] ?DOB: May 03, 1951 ?Today's Date: 09/06/2021 ? ?History of Present Illness ? 71 yo male admitted with sepsis, bacteremia. Hx of R TKA 2017  ?Clinical Impression ? On eval, pt was Supv level for mobility. He walked ~150 feet with a RW (pt requested to use RW). He tolerated activity well. He is hopeful to d/c home on today. He has requested a RW for home-he thinks he may need one for a few days. Plan is for possible d/c home later today per pt.    ?   ? ?Recommendations for follow up therapy are one component of a multi-disciplinary discharge planning process, led by the attending physician.  Recommendations may be updated based on patient status, additional functional criteria and insurance authorization. ? ?Follow Up Recommendations No PT follow up (pt politely declines) ? ?  ?Assistance Recommended at Discharge Intermittent Supervision/Assistance  ?Patient can return home with the following ? A little help with walking and/or transfers;A little help with bathing/dressing/bathroom;Help with stairs or ramp for entrance ? ?  ?Equipment Recommendations Rolling walker (2 wheels)  ?Recommendations for Other Services ?    ?  ?Functional Status Assessment Patient has had a recent decline in their functional status and demonstrates the ability to make significant improvements in function in a reasonable and predictable amount of time.  ? ?  ?Precautions / Restrictions Precautions ?Precautions: None ?Restrictions ?Weight Bearing Restrictions: No  ? ?  ? ?Mobility ? Bed Mobility ?  ?  ?  ?  ?  ?  ?  ?General bed mobility comments: oob in recliner ?  ? ?Transfers ?Overall transfer level: Needs assistance ?Equipment used: Rolling walker (2 wheels) ?Transfers: Sit to/from Stand ?Sit to Stand: Modified independent (Device/Increase time) ?  ?  ?  ?  ?  ?  ?  ? ?Ambulation/Gait ?Ambulation/Gait assistance: Supervision ?Gait Distance (Feet): 150  Feet ?Assistive device: Rolling walker (2 wheels) ?Gait Pattern/deviations: Decreased stride length ?  ?  ?  ?General Gait Details: Fair gait speed. No LOB with RW use. Pt requested to use the walker. ? ?Stairs ?  ?  ?  ?  ?  ? ?Wheelchair Mobility ?  ? ?Modified Rankin (Stroke Patients Only) ?  ? ?  ? ?Balance Overall balance assessment: Mild deficits observed, not formally tested ?  ?  ?  ?  ?  ?  ?  ?  ?  ?  ?  ?  ?  ?  ?  ?  ?  ?  ?   ? ? ? ?Pertinent Vitals/Pain Pain Assessment ?Pain Assessment: No/denies pain  ? ? ?Home Living Family/patient expects to be discharged to:: Private residence ?Living Arrangements: Non-relatives/Friends ?Available Help at Discharge: Friend(s) ?Type of Home: House ?Home Access: Stairs to enter ?  ?Entrance Stairs-Number of Steps: 1 ?  ?Home Layout: One level ?  ?   ?  ?Prior Function Prior Level of Function : Independent/Modified Independent ?  ?  ?  ?  ?  ?  ?  ?  ?  ? ? ?Hand Dominance  ?   ? ?  ?Extremity/Trunk Assessment  ? Upper Extremity Assessment ?Upper Extremity Assessment: Overall WFL for tasks assessed ?  ? ?Lower Extremity Assessment ?Lower Extremity Assessment: Overall WFL for tasks assessed;Generalized weakness ?  ? ?Cervical / Trunk Assessment ?Cervical / Trunk Assessment: Normal  ?Communication  ? Communication: No difficulties  ?Cognition Arousal/Alertness: Awake/alert ?Behavior During Therapy: [facility] for tasks  assessed/performed ?Overall Cognitive Status: Within Functional Limits for tasks assessed ?  ?  ?  ?  ?  ?  ?  ?  ?  ?  ?  ?  ?  ?  ?  ?  ?  ?  ?  ? ?  ?General Comments   ? ?  ?Exercises    ? ?Assessment/Plan  ?  ?PT Assessment Patient does not need any further PT services  ?PT Problem List   ? ?   ?  ?PT Treatment Interventions     ? ?PT Goals (Current goals can be found in the Care Plan section)  ?Acute Rehab PT Goals ?Patient Stated Goal: home ?PT Goal Formulation: With patient ?Time For Goal Achievement: 09/20/21 ?Potential to Achieve Goals: Good ? ?   ?Frequency   ?  ? ? ?Co-evaluation   ?  ?  ?  ?  ? ? ?  ?AM-PAC PT "6 Clicks" Mobility  ?Outcome Measure Help needed turning from your back to your side while in a flat bed without using bedrails?: None ?Help needed moving from lying on your back to sitting on the side of a flat bed without using bedrails?: None ?Help needed moving to and from a bed to a chair (including a wheelchair)?: None ?Help needed standing up from a chair using your arms (e.g., wheelchair or bedside chair)?: None ?Help needed to walk in hospital room?: A Little ?Help needed climbing 3-5 steps with a railing? : A Little ?6 Click Score: 22 ? ?  ?End of Session   ?Activity Tolerance: Patient tolerated treatment well ?Patient left: in chair;with call bell/phone within reach ?  ?  ?  ? ?Time: 7681-1572 ?PT Time Calculation (min) (ACUTE ONLY): 13 min ? ? ?Charges:   PT Evaluation ?$PT Eval Low Complexity: 1 Low ?  ?  ?   ? ? ? ? ? ?Keldan Eplin P, PT ?Acute Rehabilitation  ?Office: [phone] ?Pager: [phone] ? ?  ? ?

## 2021-09-06 NOTE — Progress Notes (Signed)
? ?[facility] Infectious Diseases Follow Up Note ? ?Patient Identification: ?Patient Name: Steven [name] MRN: [id number] [place] Date: 08/31/2021 12:33 PM ?Age: 71 y.o.Today's Date: 09/06/2021 ? ?Reason for Visit: bacteremia ? ?Principal Problem: ?  Sepsis ([place]) ?Active Problems: ?  Severe sepsis ([place]) ?  Bacteremia ?  Hepatic abscess ? ?Antibiotics:  ?Vancomycin 3/28 ?Zosyn 3/28-3/29 ?Cefepime 3/28-c ?Metronidazole 3/28-c ?  ?Lines/Hardware: Rt TKA ? ?Interval Events: Continues to be afebrile ? ? ?Assessment ?# Polymicrobial bacteremia ( Enterobacter cloacae complex and E coli, no ESBL) in the context of recent IR hepatic procedure ?  ?# ? hepatic abscess in the setting of recent IR intervention for possible solitary rt sided hepatic carcinoma  ?- s/p percutaneous bland embolization and fluoroscopic guided microwave ablation on 3/15 by IR with a curative intent  ?- IR  " gas at the ablation site is expected.  Sounds like patient is getting better but I am a little concerned about the gallbladder.  Could consider a chole drain if needed but would want to get  a HIDA scan first to make sure cystic duct is obstructed" ?- HIDA scan 3/31 WNL ?- No further intervention recommended per IR ?  ?# Leukocytosis - mildly elevated  ?# AKI - resolved  ?  ?# H/o Hepatitis C - treated with Harvoni ( 2 months of harvoni per patient) in 2018 with SVR ?# H/o alcohol use - last use 10-11 months ago per patient ?# Opioid dependence ?# Smoking - 1 pack every day, counseled  ? ?Recommendation ?Will switch IV cefepime to PO Ciprofloxacin and PO metronidazole to complete 14 days of tx ( IV and Po) ?Will fu in the clinic. Please call with questions ?Discussed with patient and primary ? ?Rest of the management as per the primary team. ?Thank you for the consult. Please page with pertinent questions or  concerns. ? ?______________________________________________________________________ ?Subjective ?patient seen and examined at the bedside.  ?Denies complaints ?Eager to go home ? ?Vitals ?BP (!) 156/65 (BP Location: Left Arm)   Pulse 71   Temp 97.7 ?F (36.5 ?C) (Oral)   Resp 16   Ht '5\' 10"'$  (1.778 m)   Wt 78.3 kg   SpO2 96%   BMI 24.77 kg/m?  ? ?  ?Physical Exam ?Constitutional: lying in the bed, not in acute distress ?   Comments:  ? ?Cardiovascular:  ?   Rate and Rhythm: Normal rate and regular rhythm.  ?   Heart sounds:  ? ?Pulmonary:  ?   Effort: Pulmonary effort is normal.  ?   Comments:  ? ?Abdominal:  ?   Palpations: Abdomen is soft.  ?   Tenderness: Nontender and nondistended ? ?Musculoskeletal:     ?   General: No swelling or tenderness.  ? ?Skin: ?   Comments:  ? ?Neurological:  ?   General: Grossly nonfocal, awake alert and oriented ? ?Psychiatric:     ?   Mood and Affect: Mood normal.  ? ?Pertinent Microbiology ?Results for orders placed or performed during the hospital encounter of 08/31/21  ?Blood Culture (routine x 2)     Status: Abnormal  ? Collection Time: 08/31/21  1:43 PM  ? Specimen: BLOOD  ?Result Value Ref Range Status  ? Specimen Description   Final  ?  BLOOD LEFT ANTECUBITAL ?Performed at [facility], [address], [place], [place] [address] ?  ? Special Requests   Final  ?  Blood Culture adequate volume BOTTLES DRAWN AEROBIC AND ANAEROBIC ?Performed at  [facility], [address], [place], [place] [address] ?  ? Culture  Setup Time   Final  ?  GRAM NEGATIVE RODS BOTTLES DRAWN AEROBIC ONLY ANAEROBIC BOTTLE ONLY ?Gram Stain Report Called to,Read Back By and Verified With: B SHOOK RN [phone] K [place] ?CRITICAL RESULT CALLED TO, READ BACK BY AND VERIFIED WITH: Lindell Spar [facility] 09/01/21 @ 1556 BY DRT ?Performed at [facility], [address] [place], [place] [address] ?  ? Culture ESCHERICHIA COLI ?ENTEROBACTER CLOACAE ? (A)  Final  ? Report Status 09/04/2021 FINAL  Final  ? Organism  ID, Bacteria ESCHERICHIA COLI  Final  ? Organism ID, Bacteria ENTEROBACTER CLOACAE  Final  ?    Susceptibility  ? Enterobacter cloacae - MIC*  ?  CEFAZOLIN >=64 RESISTANT Resistant   ?  CEFEPIME <=0.12 SENSITIVE Sensitive   ?  CEFTAZIDIME <=1 SENSITIVE Sensitive   ?  CIPROFLOXACIN <=0.25 SENSITIVE Sensitive   ?  GENTAMICIN <=1 SENSITIVE Sensitive   ?  IMIPENEM <=0.25 SENSITIVE Sensitive   ?  TRIMETH/SULFA <=20 SENSITIVE Sensitive   ?  PIP/TAZO <=4 SENSITIVE Sensitive   ?  * ENTEROBACTER CLOACAE  ? Escherichia coli - MIC*  ?  AMPICILLIN <=2 SENSITIVE Sensitive   ?  CEFAZOLIN <=4 SENSITIVE Sensitive   ?  CEFEPIME <=0.12 SENSITIVE Sensitive   ?  CEFTAZIDIME <=1 SENSITIVE Sensitive   ?  CEFTRIAXONE <=0.25 SENSITIVE Sensitive   ?  CIPROFLOXACIN <=0.25 SENSITIVE Sensitive   ?  GENTAMICIN <=1 SENSITIVE Sensitive   ?  IMIPENEM <=0.25 SENSITIVE Sensitive   ?  TRIMETH/SULFA <=20 SENSITIVE Sensitive   ?  AMPICILLIN/SULBACTAM <=2 SENSITIVE Sensitive   ?  PIP/TAZO <=4 SENSITIVE Sensitive   ?  * ESCHERICHIA COLI  ?Blood Culture (routine x 2)     Status: Abnormal  ? Collection Time: 08/31/21  1:43 PM  ? Specimen: BLOOD  ?Result Value Ref Range Status  ? Specimen Description   Final  ?  BLOOD BLOOD LEFT FOREARM ?Performed at [facility], [address] [place], [place] [address] ?  ? Special Requests   Final  ?  BOTTLES DRAWN AEROBIC AND ANAEROBIC Blood Culture adequate volume ?Performed at [facility], [address] [place], [place] [address] ?  ? Culture  Setup Time   Final  ?  GRAM NEGATIVE RODS ?CRITICAL VALUE NOTED.  VALUE IS CONSISTENT WITH PREVIOUSLY REPORTED AND CALLED VALUE. ?BOTH AEROBIC AND ANAEROBIC BOTTLES ?Performed at [facility], [address] [place], [place] [address] ?  ? Culture (A)  Final  ?  ENTEROBACTER CLOACAE ?SUSCEPTIBILITIES PERFORMED ON PREVIOUS CULTURE WITHIN THE LAST 5 DAYS. ?Performed at [facility], [place] [address] [place],  [address] ?  ? Report Status 09/04/2021 FINAL  Final   ?Blood Culture ID Panel (Reflexed)     Status: Abnormal  ? Collection Time: 08/31/21  1:43 PM  ?Result Value Ref Range Status  ? Enterococcus faecalis NOT DETECTED NOT DETECTED Final  ? Enterococcus Faecium NOT DETECTED NOT DETECTED Final  ? Listeria monocytogenes NOT DETECTED NOT DETECTED Final  ? Staphylococcus species NOT DETECTED NOT DETECTED Final  ? Staphylococcus aureus (BCID) NOT DETECTED NOT DETECTED Final  ? Staphylococcus epidermidis NOT DETECTED NOT DETECTED Final  ? Staphylococcus lugdunensis NOT DETECTED NOT DETECTED Final  ? Streptococcus species NOT DETECTED NOT DETECTED Final  ? Streptococcus agalactiae NOT DETECTED NOT DETECTED Final  ? Streptococcus pneumoniae NOT DETECTED NOT DETECTED Final  ? Streptococcus pyogenes NOT DETECTED NOT DETECTED  Final  ? A.calcoaceticus-baumannii NOT DETECTED NOT DETECTED Final  ? Bacteroides fragilis NOT DETECTED NOT DETECTED Final  ? Enterobacterales DETECTED (A) NOT DETECTED Final  ?  Comment: CRITICAL RESULT CALLED TO, READ BACK BY AND VERIFIED WITH: ?Lindell Spar [facility] 09/01/21 @ 1559 BY DRT ?  ? Enterobacter cloacae complex DETECTED (A) NOT DETECTED Final  ?  Comment: CRITICAL RESULT CALLED TO, READ BACK BY AND VERIFIED WITH: ?Lindell Spar [facility] 09/01/21 @ 1559 BY DRT ?  ? Escherichia coli DETECTED (A) NOT DETECTED Final  ?  Comment: CRITICAL RESULT CALLED TO, READ BACK BY AND VERIFIED WITH: ?Lindell Spar [facility] 09/01/21 @ 1559 BY DRT ?  ? Klebsiella aerogenes NOT DETECTED NOT DETECTED Final  ? Klebsiella oxytoca NOT DETECTED NOT DETECTED Final  ? Klebsiella pneumoniae NOT DETECTED NOT DETECTED Final  ? Proteus species NOT DETECTED NOT DETECTED Final  ? Salmonella species NOT DETECTED NOT DETECTED Final  ? Serratia marcescens NOT DETECTED NOT DETECTED Final  ? Haemophilus influenzae NOT DETECTED NOT DETECTED Final  ? Neisseria meningitidis NOT DETECTED NOT DETECTED Final  ? Pseudomonas aeruginosa NOT DETECTED NOT DETECTED Final  ? Stenotrophomonas maltophilia NOT  DETECTED NOT DETECTED Final  ? Candida albicans NOT DETECTED NOT DETECTED Final  ? Candida auris NOT DETECTED NOT DETECTED Final  ? Candida glabrata NOT DETECTED NOT DETECTED Final  ? Candida krusei NOT DETECTED NOT DETECTED Final  ? Candida parapsilosis NOT DETECTED NOT DETECTED Final  ? Candida tropicali

## 2021-09-06 NOTE — Discharge Instructions (Signed)
Advised to follow-up with primary care physician in 1 week. ?Advised to follow-up with gastroenterology as scheduled. ?Advised to take ciprofloxacin 750 mg twice daily for 7 days, metronidazole 500 mg a day for 7 days to complete 7-day treatment for E. coli bacteremia. ?Follow-up infectious diseases as scheduled. ?

## 2021-09-06 NOTE — Discharge Summary (Signed)
Physician Discharge Summary  ?Steven [name] FEO:[id number] DOB: 16-Nov-1950 DOA: 08/31/2021 ? ?PCP: [name], MD ? ?Admit date: 08/31/2021 ? ?Discharge date: 09/06/2021 ? ?Admitted From: Home. ? ?Disposition:  Home. ? ?Recommendations for Outpatient Follow-up:  ?Follow up with PCP in 1-2 weeks. ?Please obtain BMP/CBC in one week. ?Advised to follow-up with Gastroenterology as scheduled. ?Advised to take ciprofloxacin 750 mg twice daily for 7 days, Metronidazole 500 mg twice day for 7 days to complete 14-day treatment for E. coli bacteremia. ?Follow-up infectious diseases as scheduled. ? ?Home Health: None ?Equipment/Devices:N one ? ?Discharge Condition: Good ?CODE STATUS:Full code ?Diet recommendation: Heart Healthy  ? ?Brief Summary/ Hospital Course: ?This 71 years old male with PMH significant for hepatocellular carcinoma, hepatitis C, treated with Harvoni, EtOH abuse, hypertension, AAA presented in the [facility] ED.  Patient presented in the ED with intermittent fevers,  chills and rigors.  Patient underwent microwave guided ablation of a solitary right-sided liver tumor. ?Patient was admitted for septic shock requiring Levophed support and initially admitted in the ICU.Marland Kitchen  He is successfully weaned off of Levophed. Ultrasound RUQ showed abnormality in the right hepatic lobe but no clear focus of fluid or abscess.  CTA chest showed some concern for pneumonia.  Procalcitonin elevated on admission.  Blood cultures growing gram-negative rods.  Infectious disease consulted, antibiotics changed to cefepime IV and oral metronidazole.  IR evaluation for possible hepatic abscess.  IR states there is nothing else they can offer.  Patient feels better denies any pain.  ID recommended patient can be discharged on oral antibiotics and patient will follow up with infectious disease outpatient.  Patient is being discharged home. ? ?Discharge Diagnoses:  ?Principal Problem: ?  Sepsis ([place]) ?Active Problems: ?  Severe  sepsis ([place]) ?  Bacteremia ?  Hepatic abscess ?  Leukocytosis ? ?Septic shock secondary to E. coli bacteremia: ?Patient presented with tachycardia, tachypnea, hypotension requiring Levophed support. ?Patient is now off Levophed. Blood cultures growing E. Coli / Enterobacter. ?Antibiotics changed to Zosyn.  Infectious disease consulted.  Antibiotic changed to cefepime IV and oral metronidazole. ?Blood pressure has improved, shock resolved. ?Lactic acidosis resolved with IV hydration. ?Procalcitonin 72> 65> 29 > 2.26 ?HIDA scan negative.  ID still suspect hepatic source for polymicrobial bacteremia. ?Continue IV cefepime and p.o. metronidazole pending final cultures. ?ID stated patient can be discharged on oral ciprofloxacin and metronidazole for total 14 days. ?  ?Acute kidney injury: > Resolved. ?Presented with serum creatinine 2.4, baseline creatinine normal. ?Could be secondary to hypotension. ?Avoid nephrotoxic medications ?Serum creatinine improving 2.42> 2.03> 1.94> 1.40>1.35 >1.14 ?Continue IV hydration. ?  ?Acute hepatic failure: ?[facility] ?Continue to monitor LFTs. ?IR recommended HIDA scan which is normal. ?Follow-up IR for possible hepatic abscess. ?IR states nothing else to offer.  Continue antibiotics as per ID ?  ?Infrarenal AAA: ?Continue outpatient follow-up ?  ?Opioid abuse: ?Continue Suboxone. ?  ?EtOH dependence: ?Continue thiamine, folate. ?  ?COPD: stable, Continue regular inhaler. ? ? ?Discharge Instructions ? ?Discharge Instructions   ? ? Call MD for:  difficulty breathing, headache or visual disturbances   Complete by: As directed ?  ? Call MD for:  persistant dizziness or light-headedness   Complete by: As directed ?  ? Call MD for:  persistant nausea and vomiting   Complete by: As directed ?  ? Diet - low sodium heart healthy   Complete by: As directed ?  ? Diet Carb Modified  Complete by: As directed ?  ? Discharge instructions   Complete by: As  directed ?  ? Advised to follow-up with primary care physician in 1 week. ?Advised to follow-up with gastroenterology as scheduled. ?Advised to take ciprofloxacin 750 mg twice daily for 7 days, metronidazole 500 mg a day for 7 days to complete 7-day treatment for E. coli bacteremia. ?Follow-up infectious diseases as scheduled.  ? Increase activity slowly   Complete by: As directed ?  ? ?  ? ?Allergies as of 09/06/2021   ?No Known Allergies ?  ? ?  ?Medication List  ?  ? ?TAKE these medications   ? ?aspirin-acetaminophen-caffeine 250-250-65 MG tablet ?Commonly known as: [place] ?Take 1 tablet by mouth every 6 (six) hours as needed for headache or migraine. ?  ?buPROPion 300 MG 24 hr tablet ?Commonly known as: WELLBUTRIN XL ?Take 300 mg by mouth every morning. ?  ?cholecalciferol 25 MCG (1000 UNIT) tablet ?Commonly known as: VITAMIN D3 ?Take 1,000 Units by mouth daily. ?  ?ciprofloxacin 750 MG tablet ?Commonly known as: CIPRO ?Take 1 tablet (750 mg total) by mouth 2 (two) times daily for 15 doses. Stop date 09/13/21 ?  ?cloNIDine 0.1 MG tablet ?Commonly known as: CATAPRES ?Take 0.1 mg by mouth at bedtime. ?  ?esomeprazole 20 MG capsule ?Commonly known as: [place] ?Take 20 mg by mouth daily as needed (acid reflux). ?  ?furosemide 20 MG tablet ?Commonly known as: LASIX ?Take 10 mg by mouth 2 (two) times daily. ?  ?gabapentin 600 MG tablet ?Commonly known as: NEURONTIN ?Take 600 mg by mouth 3 (three) times daily. ?  ?lisinopril 5 MG tablet ?Commonly known as: ZESTRIL ?Take 5 mg by mouth daily. ?  ?metroNIDAZOLE 500 MG tablet ?Commonly known as: FLAGYL ?Take 1 tablet (500 mg total) by mouth every 12 (twelve) hours for 15 doses. Stop date 09/13/21 ?  ?Suboxone 8-2 MG Film ?Generic drug: Buprenorphine HCl-Naloxone HCl ?Take 1 Film by mouth daily. ?  ?triamcinolone cream 0.1 % ?Commonly known as: KENALOG ?Apply 1 application topically daily as needed (rash/itching). ?  ?Veltassa 8.4 g packet ?Generic drug:  patiromer ?Take 8.4 g by mouth 2 (two) times a week. Pt states at preop he takes it 2 times per week not on a regular schedule. ?  ? ?  ? ? Follow-up Information   ? ? [name], MD Follow up in 1 week(s).   ?Specialty: Internal Medicine ?Contact information: ?[address] ?[place] [address] ?[phone] ? ? ?  ?  ? ? Rosiland Oz, MD Follow up in 1 week(s).   ?Specialty: Infectious Diseases ?Contact information: ?[address] ?[address] ?[place] [address] ?[phone] ? ? ?  ?  ? ?  ?  ? ?  ? ?No Known Allergies ? ?Consultations: ?Infectious Diseases ?IR ?PCCM ? ? ?Procedures/Studies: ?CT ABDOMEN PELVIS WO CONTRAST ? ?Result Date: 08/31/2021 ?CLINICAL DATA:  Abdominal pain, sepsis EXAM: CT ABDOMEN AND PELVIS WITHOUT CONTRAST TECHNIQUE: Multidetector CT imaging of the abdomen and pelvis was performed following the standard protocol without IV contrast. RADIATION DOSE REDUCTION: This exam was performed according to the departmental dose-optimization program which includes automated exposure control, adjustment of the mA and/or kV according to patient size and/or use of iterative reconstruction technique. COMPARISON:  06/24/2021 FINDINGS: Lower chest: Fibrotic changes are noted in the lower lung fields. Hepatobiliary: There is 7.4 x 4.3 cm area of mixed density with pockets of air in the right lobe of liver. In  the previous study smaller low-density lesion was seen in the same region. There is presence of air in few bile duct branches within the liver. There is no dilation of bile ducts. Gallbladder is distended. There is high density in the dependent portion of gallbladder lumen suggesting gallstones. There is no significant wall thickening in gallbladder. Pancreas: There is atrophy.  No focal abnormality is seen. Spleen: Unremarkable. Adrenals/Urinary Tract: There is nodularity in the left adrenal with no significant interval change. There is no hydronephrosis. There are  scattered calcifications in the renal artery branches. No definite renal or ureteral stones are seen. Foley catheter is seen in the urinary bladder. Urinary bladder is not distended and not optimally evaluated. Stomach/B

## 2021-09-09 ENCOUNTER — Other Ambulatory Visit: Payer: Self-pay | Admitting: *Deleted

## 2021-09-09 DIAGNOSIS — K769 Liver disease, unspecified: Secondary | ICD-10-CM

## 2021-09-13 DIAGNOSIS — I10 Essential (primary) hypertension: Secondary | ICD-10-CM | POA: Diagnosis not present

## 2021-09-13 DIAGNOSIS — J439 Emphysema, unspecified: Secondary | ICD-10-CM | POA: Diagnosis not present

## 2021-09-15 ENCOUNTER — Other Ambulatory Visit: Payer: Self-pay

## 2021-09-15 ENCOUNTER — Ambulatory Visit (INDEPENDENT_AMBULATORY_CARE_PROVIDER_SITE_OTHER): Payer: Medicare Other | Admitting: Infectious Diseases

## 2021-09-15 ENCOUNTER — Encounter: Payer: Self-pay | Admitting: Infectious Diseases

## 2021-09-15 VITALS — BP 173/90 | HR 108 | Temp 97.7°F | Wt 156.0 lb

## 2021-09-15 DIAGNOSIS — R7881 Bacteremia: Secondary | ICD-10-CM | POA: Diagnosis not present

## 2021-09-15 DIAGNOSIS — K769 Liver disease, unspecified: Secondary | ICD-10-CM | POA: Diagnosis not present

## 2021-09-15 DIAGNOSIS — B182 Chronic viral hepatitis C: Secondary | ICD-10-CM | POA: Diagnosis not present

## 2021-09-15 DIAGNOSIS — Z5181 Encounter for therapeutic drug level monitoring: Secondary | ICD-10-CM | POA: Diagnosis not present

## 2021-09-15 DIAGNOSIS — C220 Liver cell carcinoma: Secondary | ICD-10-CM

## 2021-09-15 NOTE — Progress Notes (Addendum)
? ?   ? ? ?Patient Active Problem List  ? Diagnosis Date Noted  ? Leukocytosis   ? Bacteremia   ? Hepatic abscess   ? Severe sepsis ([place]) 09/01/2021  ? Sepsis ([place]) 08/31/2021  ? [place] (hepatocellular carcinoma) ([place]) 08/18/2021  ? Positive colorectal cancer screening using Cologuard test 10/06/2017  ? Hepatic fibrosis 10/06/2017  ? Dilated bile duct 10/21/2016  ? Chronic hepatitis C ([place]) 08/01/2016  ? Colon cancer screening 08/01/2016  ? S/P total knee replacement, right 11/04/15 11/04/2015  ? Osteoarthritis   ? Right knee pain 07/09/2015  ? Right shoulder pain 07/09/2015  ? ? ?Patient's Medications  ?New Prescriptions  ? No medications on file  ?Previous Medications  ? ASPIRIN-ACETAMINOPHEN-CAFFEINE (EXCEDRIN MIGRAINE) 250-250-65 MG TABLET    Take 1 tablet by mouth every 6 (six) hours as needed for headache or migraine.  ? BUPROPION (WELLBUTRIN XL) 300 MG 24 HR TABLET    Take 300 mg by mouth every morning.  ? CHOLECALCIFEROL (VITAMIN D3) 25 MCG (1000 UNIT) TABLET    Take 1,000 Units by mouth daily.  ? CLONIDINE (CATAPRES) 0.1 MG TABLET    Take 0.1 mg by mouth at bedtime.  ? ESOMEPRAZOLE (NEXIUM) 20 MG CAPSULE    Take 20 mg by mouth daily as needed (acid reflux).  ? FUROSEMIDE (LASIX) 20 MG TABLET    Take 10 mg by mouth 2 (two) times daily.  ? GABAPENTIN (NEURONTIN) 600 MG TABLET    Take 600 mg by mouth 3 (three) times daily.  ? LISINOPRIL (ZESTRIL) 5 MG TABLET    Take 5 mg by mouth daily.  ? SUBOXONE 8-2 MG FILM    Take 1 Film by mouth daily.  ? TRIAMCINOLONE CREAM (KENALOG) 0.1 %    Apply 1 application topically daily as needed (rash/itching).  ? VELTASSA 8.4 G PACKET    Take 8.4 g by mouth 2 (two) times a week. Pt states at preop he takes it 2 times per week not on a regular schedule.  ?Modified Medications  ? No medications on file  ?Discontinued Medications  ? No medications on file  ? ? ?Subjective: ?Here for HFU for polymicrobial GNR bacteremia in the setting of recent IR hepatic procedure. He has completed  recommended doses of PO ciprofloxacin and metronidazole as instructed on 4/10. Denies missing doses. He had some nausea and diarrhea in the beginning but that has resolved. Denies fevers, chills, sweats. Denies abdominal pain. Appetite is good. He said he has an appt with Radiology on 4/17 and has labs done this morning. No concerns otherwise. ? ?Review of Systems: ?ROS all systems reviewed and pertinent positives and negatives as listed above ? ?Past Medical History:  ?Diagnosis Date  ? Cancer [facility])   ? skin adn liver  ? EtOH dependence ([place])   ? Hepatitis C   ? successfully eradicated with Harvoni in 2018 with SVR documented  ? Hypertension   ? Shoulder impingement 2004  ? ?Past Surgical History:  ?Procedure Laterality Date  ? COLONOSCOPY WITH PROPOFOL N/A 05/03/2021  ? Procedure: COLONOSCOPY WITH PROPOFOL;  Surgeon: Eloise Harman, DO;  [facility];  Service: Endoscopy;  Laterality: N/A;  7:30 / ASA III  ? FRACTURE SURGERY Left   ? wrist  ? IR 3D INDEPENDENT WKST  08/18/2021  ? IR ANGIOGRAM SELECTIVE EACH ADDITIONAL VESSEL  08/18/2021  ? IR ANGIOGRAM SELECTIVE EACH ADDITIONAL VESSEL  08/18/2021  ? IR ANGIOGRAM VISCERAL SELECTIVE  08/18/2021  ? IR EMBO  TUMOR ORGAN ISCHEMIA INFARCT INC GUIDE ROADMAPPING  08/18/2021  ? IR RADIOLOGIST EVAL & MGMT  05/18/2021  ? IR US GUIDE TISSUE ABLATION  08/18/2021  ? IR US GUIDE VASC ACCESS LEFT  08/18/2021  ? POLYPECTOMY  05/03/2021  ? Procedure: POLYPECTOMY;  Surgeon: Eloise Harman, DO;  [facility];  Service: Endoscopy;;  ascending,descending  ? RADIOFREQUENCY ABLATION N/A 08/18/2021  ? Procedure: MICROWAVE  ABLATION;  Surgeon: Sandi Mariscal, MD;  [facility];  Service: Anesthesiology;  Laterality: N/A;  ? SHOULDER ACROMIOPLASTY Right 11/20/2012  ? Procedure: SHOULDER ACROMIOPLASTY;  Surgeon: Sanjuana Kava, MD;  Location: [facility];  Service: Orthopedics;  Laterality: Right;  ? SKIN CANCER EXCISION Left 2013  ? wrist surgery fell off a roof  ? STOMACH  SURGERY    ? had bleeding ulcers repaired. age 13  ? TOTAL KNEE ARTHROPLASTY Right 11/04/2015  ? Procedure: RIGHT TOTAL KNEE ARTHROPLASTY;  Surgeon: Carole Civil, MD;  Location: [facility];  Service: Orthopedics;  Laterality: Right;  ? ? ? ?Social History  ? ?Tobacco Use  ? Smoking status: Every Day  ?  Packs/day: 1.00  ?  Years: 49.00  ?  Pack years: 49.00  ?  Types: Cigars, Cigarettes  ? Smokeless tobacco: Never  ?Vaping Use  ? Vaping Use: Never used  ?Substance Use Topics  ? Alcohol use: Not Currently  ?  Comment: 09/22/20 stopped drinking beer  ? Drug use: Yes  ?  Types: Marijuana  ?  Comment: every 3-4 months  ? ? ?Family History  ?Problem Relation Age of Onset  ? Liver disease Neg Hx   ? Colon cancer Neg Hx   ? ? ?No Known Allergies ? ?Health Maintenance  ?Topic Date Due  ? COVID-19 Vaccine (1) Never done  ? Pneumonia Vaccine 27+ Years old (1 - PCV) Never done  ? TETANUS/TDAP  Never done  ? Zoster Vaccines- Shingrix (1 of 2) Never done  ? INFLUENZA VACCINE  01/04/2022  ? COLONOSCOPY (Pts 45-68yr Insurance coverage will need to be confirmed)  05/04/2031  ? Hepatitis C Screening  Completed  ? HPV VACCINES  Aged Out  ? ? ?Objective: ?BP (!) 173/90   Pulse (!) 108   Temp 97.7 ?F (36.5 ?C) (Temporal)   Wt 156 lb (70.8 kg)   SpO2 98%   BMI 22.38 kg/m?  ? ? ?Physical Exam ?Constitutional:   ?   Appearance: Normal appearance.  ?HENT:  ?   Head: Normocephalic and atraumatic.   ?   Mouth: Mucous membranes are moist.  ?Eyes: ?   Conjunctiva/sclera: Conjunctivae normal.  ?   Pupils:   ? ?Cardiovascular:  ?   Rate and Rhythm: Normal rate and regular rhythm.  ?   Heart sounds:  ? ?Pulmonary:  ?   Effort: Pulmonary effort is normal.  ?   Breath sounds: Normal breath sounds.  ? ?Abdominal:  ?   General: Non distended  ?   Palpations: soft. Non tender  ? ?Musculoskeletal:     ?   General: Normal range of motion.  ? ?Skin: ?   General: Skin is warm and dry.  ?   Comments: ? ?Neurological:  ?   General: grossly non focal   ?   Mental Status: awake, alert and oriented to person, place, and time.  ? ?Psychiatric:     ?   Mood and Affect: Mood normal.  ? ?Lab Results ?Lab Results  ?Component Value Date  ? WBC 14.5 (  H) 09/06/2021  ? HGB 11.7 (L) 09/06/2021  ? HCT 35.3 (L) 09/06/2021  ? MCV 93.1 09/06/2021  ? PLT 294 09/06/2021  ?  ?Lab Results  ?Component Value Date  ? CREATININE 1.14 09/04/2021  ? BUN 23 09/04/2021  ? NA 137 09/04/2021  ? K 4.4 09/04/2021  ? CL 106 09/04/2021  ? CO2 26 09/04/2021  ?  ?Lab Results  ?Component Value Date  ? ALT 146 (H) 09/02/2021  ? AST 74 (H) 09/02/2021  ? ALKPHOS 78 09/02/2021  ? BILITOT 0.6 09/02/2021  ?  ?No results found for: CHOL, HDL, LDLCALC, LDLDIRECT, TRIG, CHOLHDL ?No results found for: LABRPR, RPRTITER ?No results found for: HIV1RNAQUANT, HIV1RNAVL, CD4TABS ?  ?Problem List Items Addressed This Visit   ? ?  ? Digestive  ? Chronic hepatitis C ([place])  ? [place] (hepatocellular carcinoma) ([place])  ?  ? Other  ? Bacteremia - Primary  ? Medication monitoring encounter  ? ? ? ?Assessment ?# Polymicrobial bacteremia ( Enterobacter cloacae complex and E coli, no ESBL) in the context of recent IR hepatic procedure ?-completed tx as instructed 3/28-4/10( cefepime/ciprofloxacin + metronidazole) ? ?# Possible solitary rt sided hepatic carcinoma  ?- s/p percutaneous bland embolization and fluoroscopic guided microwave ablation on 3/15 by IR with a curative intent. Fu with IR as instructed  ? ?# H/o Hepatitis C - treated with Harvoni ( 2 months of harvoni per patient) in 2018 with SVR ?# H/o alcohol use - last use 10-11 months ago ?# Smoking - 1 pack every day, counseled  ?  ?I have personally spent 40  minutes involved in face-to-face and non-face-to-face activities for this patient on the day of the visit including staff time, coordination of care and counseling of the patient.  ? ?Wilber Oliphant, MD ?[facility] ?[facility] ?09/15/2021, 2:39 PM ? ?

## 2021-09-17 LAB — COMPLETE METABOLIC PANEL WITH GFR
AG Ratio: 1.8 (calc) (ref 1.0–2.5)
ALT: 16 U/L (ref 9–46)
AST: 11 U/L (ref 10–35)
Albumin: 4 g/dL (ref 3.6–5.1)
Alkaline phosphatase (APISO): 59 U/L (ref 35–144)
BUN: 12 mg/dL (ref 7–25)
CO2: 28 mmol/L (ref 20–32)
Calcium: 9.2 mg/dL (ref 8.6–10.3)
Chloride: 103 mmol/L (ref 98–110)
Creat: 1.21 mg/dL (ref 0.70–1.35)
Globulin: 2.2 g/dL (calc) (ref 1.9–3.7)
Glucose, Bld: 134 mg/dL — ABNORMAL HIGH (ref 65–99)
Potassium: 4 mmol/L (ref 3.5–5.3)
Sodium: 140 mmol/L (ref 135–146)
Total Bilirubin: 0.6 mg/dL (ref 0.2–1.2)
Total Protein: 6.2 g/dL (ref 6.1–8.1)
eGFR: 65 mL/min/{1.73_m2} (ref >=60)

## 2021-09-17 LAB — AFP TUMOR MARKER: AFP-Tumor Marker: 3.6 ng/mL (ref <6.1)

## 2021-09-20 ENCOUNTER — Ambulatory Visit
Admission: RE | Admit: 2021-09-20 | Discharge: 2021-09-20 | Disposition: A | Discharge status: Home / Self Care | Payer: Medicare Other | Source: Ambulatory Visit | Attending: Radiology | Admitting: Radiology

## 2021-09-20 ENCOUNTER — Encounter: Payer: Self-pay | Admitting: *Deleted

## 2021-09-20 DIAGNOSIS — C220 Liver cell carcinoma: Secondary | ICD-10-CM | POA: Diagnosis not present

## 2021-09-20 DIAGNOSIS — Z9889 Other specified postprocedural states: Secondary | ICD-10-CM | POA: Diagnosis not present

## 2021-09-20 HISTORY — PX: IR RADIOLOGIST EVAL & MGMT: IMG5224

## 2021-09-20 NOTE — Progress Notes (Signed)
Patient ID: Steven [name], male   DOB: 1951/03/19, 71 y.o.   MRN: [id number] ? ?    ? ? ?Chief Complaint: ?Indeterminate liver lesion worrisome for hepatocellular carcinoma  ?  ?Referring Physician(s): ?Drazek,Dawn ?  ?History of Present Illness: ?JOHNTHAN [name] is a 71 y.o. male with past medical history significant for hepatitis C (treated with Harvoni in 2018) and alcoholism who has been referred for potential percutaneous management of worrisome lesion within the dome of the anterior segment of the right lobe of the liver which demonstrates imaging characteristics compatible with a LI-RADS 5 lesion given provided history of hepatitis C and alcoholism.  For this, the patient underwent combined bland hepatic embolization and image guided microwave ablation on 08/18/2021.  The patient is seen today in postprocedural telemedicine consultation. ? ?Unfortunately, the patient was readmitted to the hospital on 08/31/2021 where he was found to have polymicrobial GNR bacteremia.  Noncontrast CT scan of the chest, and abdomen and pelvis performed 08/31/2021 demonstrated a technically excellent result with expected appearance of the ablation site.  Also, subsequent nuclear medicine HIDA scan was negative for evidence of acute cholecystitis. ? ?The patient was discharged from the hospital and has completed his outpatient prescribed course of antibiotics.  Patient states that he feels weak though has regained his appetite and energy level.  He denies recurrence of abdominal pain, fever or chills. ? ? ?Past Medical History:  ?Diagnosis Date  ? Cancer [facility])   ? skin adn liver  ? EtOH dependence ([place])   ? Hepatitis C   ? successfully eradicated with Harvoni in 2018 with SVR documented  ? Hypertension   ? Shoulder impingement 2004  ? ? ?Past Surgical History:  ?Procedure Laterality Date  ? COLONOSCOPY WITH PROPOFOL N/A 05/03/2021  ? Procedure: COLONOSCOPY WITH PROPOFOL;  Surgeon: Eloise Harman, DO;  [facility];   Service: Endoscopy;  Laterality: N/A;  7:30 / ASA III  ? FRACTURE SURGERY Left   ? wrist  ? IR 3D INDEPENDENT WKST  08/18/2021  ? IR ANGIOGRAM SELECTIVE EACH ADDITIONAL VESSEL  08/18/2021  ? IR ANGIOGRAM SELECTIVE EACH ADDITIONAL VESSEL  08/18/2021  ? IR ANGIOGRAM VISCERAL SELECTIVE  08/18/2021  ? IR EMBO TUMOR ORGAN ISCHEMIA INFARCT INC GUIDE ROADMAPPING  08/18/2021  ? IR RADIOLOGIST EVAL & MGMT  05/18/2021  ? IR US GUIDE TISSUE ABLATION  08/18/2021  ? IR US GUIDE VASC ACCESS LEFT  08/18/2021  ? POLYPECTOMY  05/03/2021  ? Procedure: POLYPECTOMY;  Surgeon: Eloise Harman, DO;  [facility];  Service: Endoscopy;;  ascending,descending  ? RADIOFREQUENCY ABLATION N/A 08/18/2021  ? Procedure: MICROWAVE  ABLATION;  Surgeon: Sandi Mariscal, MD;  [facility];  Service: Anesthesiology;  Laterality: N/A;  ? SHOULDER ACROMIOPLASTY Right 11/20/2012  ? Procedure: SHOULDER ACROMIOPLASTY;  Surgeon: Sanjuana Kava, MD;  Location: [facility];  Service: Orthopedics;  Laterality: Right;  ? SKIN CANCER EXCISION Left 2013  ? wrist surgery fell off a roof  ? STOMACH SURGERY    ? had bleeding ulcers repaired. age 63  ? TOTAL KNEE ARTHROPLASTY Right 11/04/2015  ? Procedure: RIGHT TOTAL KNEE ARTHROPLASTY;  Surgeon: Carole Civil, MD;  Location: [facility];  Service: Orthopedics;  Laterality: Right;  ? ? ?Allergies: ?Patient has no known allergies. ? ?Medications: ?Prior to Admission medications   ?Medication Sig Start Date End Date Taking? Authorizing Provider  ?aspirin-acetaminophen-caffeine (EXCEDRIN MIGRAINE) 250-250-65 MG tablet Take 1 tablet by mouth every 6 (six) hours as needed  for headache or migraine.    [provider]  ?buPROPion (WELLBUTRIN XL) 300 MG 24 hr tablet Take 300 mg by mouth every morning. 09/18/20   [provider]  ?cholecalciferol (VITAMIN D3) 25 MCG (1000 UNIT) tablet Take 1,000 Units by mouth daily.    [provider]  ?cloNIDine (CATAPRES) 0.1 MG tablet Take 0.1 mg by mouth at  bedtime. 07/31/16   [provider]  ?esomeprazole (NEXIUM) 20 MG capsule Take 20 mg by mouth daily as needed (acid reflux).    [provider]  ?furosemide (LASIX) 20 MG tablet Take 10 mg by mouth 2 (two) times daily. 08/04/20   [provider]  ?gabapentin (NEURONTIN) 600 MG tablet Take 600 mg by mouth 3 (three) times daily. 09/18/20   [provider]  ?lisinopril (ZESTRIL) 5 MG tablet Take 5 mg by mouth daily. 08/04/20   [provider]  ?SUBOXONE 8-2 MG FILM Take 1 Film by mouth daily. 07/25/16   [provider]  ?triamcinolone cream (KENALOG) 0.1 % Apply 1 application topically daily as needed (rash/itching).    [provider]  ?VELTASSA 8.4 g packet Take 8.4 g by mouth 2 (two) times a week. Pt states at preop he takes it 2 times per week not on a regular schedule. 03/29/21   [provider]  ?  ? ?Family History  ?Problem Relation Age of Onset  ? Liver disease Neg Hx   ? Colon cancer Neg Hx   ? ? ?Social History  ? ?Socioeconomic History  ? Marital status: Divorced  ?  Spouse name: Not on file  ? Number of children: Not on file  ? Years of education: Not on file  ? Highest education level: Not on file  ?Occupational History  ? Not on file  ?Tobacco Use  ? Smoking status: Every Day  ?  Packs/day: 1.00  ?  Years: 49.00  ?  Pack years: 49.00  ?  Types: Cigars, Cigarettes  ? Smokeless tobacco: Never  ?Vaping Use  ? Vaping Use: Never used  ?Substance and Sexual Activity  ? Alcohol use: Not Currently  ?  Comment: 09/22/20 stopped drinking beer  ? Drug use: Yes  ?  Types: Marijuana  ?  Comment: every 3-4 months  ? Sexual activity: Yes  ?Other Topics Concern  ? Not on file  ?Social History Narrative  ? Not on file  ? ?Social Determinants of Health  ? ?Financial Resource Strain: Not on file  ?Food Insecurity: Not on file  ?Transportation Needs: Not on file  ?Physical Activity: Not on file  ?Stress: Not on file  ?Social Connections: Not on file  ? ? ?ECOG  Status: ?1 - Symptomatic but completely ambulatory ? ?Review of Systems ? ?Review of Systems: A 12 point ROS discussed and pertinent positives are indicated in the HPI above.  All other systems are negative. ? ?Physical Exam ?No direct physical exam was performed (except for noted visual exam findings with Video Visits).  ? ?Vital Signs: ?There were no vitals taken for this visit. ? ?Imaging: ? ?The following examinations were reviewed in detail:  ?CT of the abdomen and pelvis-08/31/2021; 06/24/2021 ?Abdominal MRI-04/24/2022 ?Hepatic bland embolization and image guided microwave ablation-08/18/2021. ? ?Review of noncontrast CT scan of the abdomen and pelvis performed 08/31/2021, performed for the evaluation of bacteremia during recent admission, demonstrates a technically excellent result with expected appearance of the ablation site and a small amount of expected pneumobilia. ? ?CT ABDOMEN PELVIS WO  CONTRAST ? ?Result Date: 08/31/2021 ?CLINICAL DATA:  Abdominal pain, sepsis EXAM: CT ABDOMEN AND PELVIS WITHOUT CONTRAST TECHNIQUE: Multidetector CT imaging of the abdomen and pelvis was performed following the standard protocol without IV contrast. RADIATION DOSE REDUCTION: This exam was performed according to the departmental dose-optimization program which includes automated exposure control, adjustment of the mA and/or kV according to patient size and/or use of iterative reconstruction technique. COMPARISON:  06/24/2021 FINDINGS: Lower chest: Fibrotic changes are noted in the lower lung fields. Hepatobiliary: There is 7.4 x 4.3 cm area of mixed density with pockets of air in the right lobe of liver. In the previous study smaller low-density lesion was seen in the same region. There is presence of air in few bile duct branches within the liver. There is no dilation of bile ducts. Gallbladder is distended. There is high density in the dependent portion of gallbladder lumen suggesting gallstones. There is no significant  wall thickening in gallbladder. Pancreas: There is atrophy.  No focal abnormality is seen. Spleen: Unremarkable. Adrenals/Urinary Tract: There is nodularity in the left adrenal with no significant interval ch

## 2021-09-21 ENCOUNTER — Inpatient Hospital Stay: Payer: Medicare Other | Admitting: Infectious Diseases

## 2021-10-06 DIAGNOSIS — C220 Liver cell carcinoma: Secondary | ICD-10-CM | POA: Diagnosis not present

## 2021-10-06 DIAGNOSIS — I10 Essential (primary) hypertension: Secondary | ICD-10-CM | POA: Diagnosis not present

## 2021-10-06 DIAGNOSIS — J449 Chronic obstructive pulmonary disease, unspecified: Secondary | ICD-10-CM | POA: Diagnosis not present

## 2021-10-06 DIAGNOSIS — N1832 Chronic kidney disease, stage 3b: Secondary | ICD-10-CM | POA: Diagnosis not present

## 2021-10-07 ENCOUNTER — Other Ambulatory Visit: Payer: Self-pay | Admitting: Interventional Radiology

## 2021-10-07 DIAGNOSIS — N1832 Chronic kidney disease, stage 3b: Secondary | ICD-10-CM | POA: Diagnosis not present

## 2021-10-07 DIAGNOSIS — K769 Liver disease, unspecified: Secondary | ICD-10-CM

## 2021-10-07 DIAGNOSIS — E875 Hyperkalemia: Secondary | ICD-10-CM | POA: Diagnosis not present

## 2021-10-07 DIAGNOSIS — D638 Anemia in other chronic diseases classified elsewhere: Secondary | ICD-10-CM | POA: Diagnosis not present

## 2021-10-07 DIAGNOSIS — I129 Hypertensive chronic kidney disease with stage 1 through stage 4 chronic kidney disease, or unspecified chronic kidney disease: Secondary | ICD-10-CM | POA: Diagnosis not present

## 2021-10-08 ENCOUNTER — Other Ambulatory Visit: Payer: Self-pay | Admitting: Nurse Practitioner

## 2021-10-08 DIAGNOSIS — K7402 Hepatic fibrosis, advanced fibrosis: Secondary | ICD-10-CM | POA: Diagnosis not present

## 2021-10-08 DIAGNOSIS — C220 Liver cell carcinoma: Secondary | ICD-10-CM | POA: Diagnosis not present

## 2021-10-12 ENCOUNTER — Other Ambulatory Visit: Payer: Self-pay | Admitting: Nurse Practitioner

## 2021-10-12 DIAGNOSIS — K769 Liver disease, unspecified: Secondary | ICD-10-CM

## 2021-11-06 ENCOUNTER — Ambulatory Visit
Admission: RE | Admit: 2021-11-06 | Discharge: 2021-11-06 | Disposition: A | Discharge status: Home / Self Care | Payer: Medicare Other | Source: Ambulatory Visit | Attending: Nurse Practitioner | Admitting: Nurse Practitioner

## 2021-11-06 DIAGNOSIS — K769 Liver disease, unspecified: Secondary | ICD-10-CM

## 2021-11-06 DIAGNOSIS — I714 Abdominal aortic aneurysm, without rupture, unspecified: Secondary | ICD-10-CM | POA: Diagnosis not present

## 2021-11-06 DIAGNOSIS — I700 Atherosclerosis of aorta: Secondary | ICD-10-CM | POA: Diagnosis not present

## 2021-11-06 DIAGNOSIS — K8020 Calculus of gallbladder without cholecystitis without obstruction: Secondary | ICD-10-CM | POA: Diagnosis not present

## 2021-11-06 DIAGNOSIS — N1832 Chronic kidney disease, stage 3b: Secondary | ICD-10-CM | POA: Diagnosis not present

## 2021-11-06 DIAGNOSIS — I10 Essential (primary) hypertension: Secondary | ICD-10-CM | POA: Diagnosis not present

## 2021-11-06 MED ORDER — GADOBENATE DIMEGLUMINE 529 MG/ML IV SOLN
15.0000 mL | Freq: Once | INTRAVENOUS | Status: AC | PRN
  Administered 2021-11-06: 15 mL via INTRAVENOUS

## 2021-11-12 ENCOUNTER — Ambulatory Visit
Admission: RE | Admit: 2021-11-12 | Discharge: 2021-11-12 | Disposition: A | Discharge status: Home / Self Care | Payer: Medicare Other | Source: Ambulatory Visit | Attending: Interventional Radiology | Admitting: Interventional Radiology

## 2021-11-12 ENCOUNTER — Encounter: Payer: Self-pay | Admitting: *Deleted

## 2021-11-12 DIAGNOSIS — Z9889 Other specified postprocedural states: Secondary | ICD-10-CM | POA: Diagnosis not present

## 2021-11-12 DIAGNOSIS — C220 Liver cell carcinoma: Secondary | ICD-10-CM | POA: Diagnosis not present

## 2021-11-12 DIAGNOSIS — K769 Liver disease, unspecified: Secondary | ICD-10-CM

## 2021-11-12 HISTORY — PX: IR RADIOLOGIST EVAL & MGMT: IMG5224

## 2021-11-12 NOTE — Progress Notes (Signed)
Patient ID: Steven [name], male   DOB: [birth year], 71 y.o.   MRN: [id number]        Chief Complaint: Indeterminate liver lesion worrisome for hepatocellular carcinoma    Referring Physician(s): Drazek,Dawn   History of Present Illness: Steven [name] is a 71 y.o. male with past medical history significant for hepatitis C (treated with Harvoni in 2018) and alcoholism who is post combined bland hepatic embolization and image guided microwave ablation on 08/18/2021 for worrisome lesion within the dome of the anterior segment of the right lobe of the liver which demonstrates imaging characteristics compatible with a LI-RADS 5 lesion given provided history of hepatitis C and alcoholism.  The patient is seen today in telemedicine consultation following acquisition of initial postprocedural MRI performed 11/06/2021.  In review, patient was readmitted to the hospital on 08/31/2021 where he was found to have polymicrobial GNR bacteremia.  Noncontrast CT scan of the chest, and abdomen and pelvis performed 08/31/2021 demonstrated a technically excellent result with expected appearance of the ablation site and the patient was subsequently discharged from the hospital on oral antibiotics.   Today, the patient reports he "feels great" and has returned to his preprocedural energy and activity levels.  He is currently without complaint.  Specifically, no unintentional weight loss or weight gain.  No yellowing of the skin or eyes.  No increased abdominal girth.  No bloody or melanotic stools.  Patient reports continued abstinence from alcohol and additionally reports he has quit smoking.    Past Medical History:  Diagnosis Date   Cancer ([place])    skin adn liver   EtOH dependence ([place])    Hepatitis C    successfully eradicated with Harvoni in 2018 with SVR documented   Hypertension    Shoulder impingement 2004    Past Surgical History:  Procedure Laterality Date   COLONOSCOPY WITH PROPOFOL N/A  05/03/2021   Procedure: COLONOSCOPY WITH PROPOFOL;  Surgeon: Eloise Harman, DO;  Location: [facility];  Service: Endoscopy;  Laterality: N/A;  7:30 / ASA III   FRACTURE SURGERY Left    wrist   IR 3D INDEPENDENT WKST  08/18/2021   IR ANGIOGRAM SELECTIVE EACH ADDITIONAL VESSEL  08/18/2021   IR ANGIOGRAM SELECTIVE EACH ADDITIONAL VESSEL  08/18/2021   IR ANGIOGRAM VISCERAL SELECTIVE  08/18/2021   IR EMBO TUMOR ORGAN ISCHEMIA INFARCT INC GUIDE ROADMAPPING  08/18/2021   IR RADIOLOGIST EVAL & MGMT  05/18/2021   IR RADIOLOGIST EVAL & MGMT  09/20/2021   IR US GUIDE TISSUE ABLATION  08/18/2021   IR US GUIDE VASC ACCESS LEFT  08/18/2021   POLYPECTOMY  05/03/2021   Procedure: POLYPECTOMY;  Surgeon: Eloise Harman, DO;  Location: [facility];  Service: Endoscopy;;  ascending,descending   RADIOFREQUENCY ABLATION N/A 08/18/2021   Procedure: MICROWAVE  ABLATION;  Surgeon: Sandi Mariscal, MD;  Location: [facility];  Service: Anesthesiology;  Laterality: N/A;   SHOULDER ACROMIOPLASTY Right 11/20/2012   Procedure: SHOULDER ACROMIOPLASTY;  Surgeon: Sanjuana Kava, MD;  Location: [facility];  Service: Orthopedics;  Laterality: Right;   SKIN CANCER EXCISION Left 2013   wrist surgery fell off a roof   STOMACH SURGERY     had bleeding ulcers repaired. age 62   TOTAL KNEE ARTHROPLASTY Right 11/04/2015   Procedure: RIGHT TOTAL KNEE ARTHROPLASTY;  Surgeon: Carole Civil, MD;  Location: [facility];  Service: Orthopedics;  Laterality: Right;    Allergies: Patient has no known allergies.  Medications: Prior to Admission medications  Medication Sig Start Date End Date Taking? Authorizing Provider  aspirin-acetaminophen-caffeine (EXCEDRIN MIGRAINE) [phone] MG tablet Take 1 tablet by mouth every 6 (six) hours as needed for headache or migraine.    [provider]  buPROPion (WELLBUTRIN XL) 300 MG 24 hr tablet Take 300 mg by mouth every morning. 09/18/20   [provider]  cholecalciferol (VITAMIN D3) 25  MCG (1000 UNIT) tablet Take 1,000 Units by mouth daily.    [provider]  cloNIDine (CATAPRES) 0.1 MG tablet Take 0.1 mg by mouth at bedtime. 07/31/16   [provider]  esomeprazole (NEXIUM) 20 MG capsule Take 20 mg by mouth daily as needed (acid reflux).    [provider]  furosemide (LASIX) 20 MG tablet Take 10 mg by mouth 2 (two) times daily. 08/04/20   [provider]  gabapentin (NEURONTIN) 600 MG tablet Take 600 mg by mouth 3 (three) times daily. 09/18/20   [provider]  lisinopril (ZESTRIL) 5 MG tablet Take 5 mg by mouth daily. 08/04/20   [provider]  SUBOXONE 8-2 MG FILM Take 1 Film by mouth daily. 07/25/16   [provider]  triamcinolone cream (KENALOG) 0.1 % Apply 1 application topically daily as needed (rash/itching).    [provider]  VELTASSA 8.4 g packet Take 8.4 g by mouth 2 (two) times a week. Pt states at preop he takes it 2 times per week not on a regular schedule. 03/29/21   [provider]     Family History  Problem Relation Age of Onset   Liver disease Neg Hx    Colon cancer Neg Hx     Social History   Socioeconomic History   Marital status: Divorced    Spouse name: Not on file   Number of children: Not on file   Years of education: Not on file   Highest education level: Not on file  Occupational History   Not on file  Tobacco Use   Smoking status: Every Day    Packs/day: 1.00    Years: 49.00    Total pack years: 49.00    Types: Cigars, Cigarettes   Smokeless tobacco: Never  Vaping Use   Vaping Use: Never used  Substance and Sexual Activity   Alcohol use: Not Currently    Comment: 09/22/20 stopped drinking beer   Drug use: Yes    Types: Marijuana    Comment: every 3-4 months   Sexual activity: Yes  Other Topics Concern   Not on file  Social History Narrative   Not on file   Social Determinants of Health   Financial Resource Strain: Not on file  Food  Insecurity: Not on file  Transportation Needs: Not on file  Physical Activity: Not on file  Stress: Not on file  Social Connections: Not on file    ECOG Status: 0 - Asymptomatic  Review of Systems  Review of Systems: A 12 point ROS discussed and pertinent positives are indicated in the HPI above.  All other systems are negative.  Physical Exam No direct physical exam was performed (except for noted visual exam findings with Video Visits).   Vital Signs: There were no vitals taken for this visit.  Imaging:  Following examinations were reviewed in detail:  Abdominal MRI - 11/06/2021; 11/28/2020 CT abdomen and pelvis - 06/24/2021; 08/31/2021  Intraprocedural images during bland embolization and fluoroscopic guided microwave ablation - 315/2020  MR ABDOMEN WWO CONTRAST  Result Date: 11/08/2021 CLINICAL DATA:  Follow-up status  post microwave ablation and bland embolization of right lobe of liver lesion. EXAM: MRI ABDOMEN WITHOUT AND WITH CONTRAST TECHNIQUE: Multiplanar multisequence MR imaging of the abdomen was performed both before and after the administration of intravenous contrast. CONTRAST:  56m MULTIHANCE GADOBENATE DIMEGLUMINE 529 MG/ML IV SOLN COMPARISON:  CT 08/31/2021 and MRI 04/24/2021 FINDINGS: Lower chest: No acute findings. Hepatobiliary: Treated lesion within segment 8 of the right lobe of liver measures 2.0 x 1.2 cm, image 30/12. This appears T1 hypointense with mild heterogeneous T2 hyperintensity. On the postcontrast images there is no internal enhancement. On the baseline post treatment study from 08/31/2021 this area measured 6.7 x 3.7 cm with internal gas reflecting necrosis. No new enhancing liver lesions. 1.3 cm gallstone. Signs adenomyomatosis within the gallbladder fundus. No gallbladder wall thickening or pericholecystic inflammation. Mild increase caliber of the CBD measures 8 mm proximally. This is compared with 7 mm previously. No signs of obstructing stone or mass.  Pancreas: No mass, inflammatory changes, or other parenchymal abnormality identified. Spleen:  Within normal limits in size and appearance. Adrenals/Urinary Tract: Normal adrenal glands. No suspicious kidney lesions or hydronephrosis identified. Stomach/Bowel: Visualized portions within the abdomen are unremarkable. Vascular/Lymphatic: Aortic atherosclerosis. Stable infrarenal abdominal aortic aneurysm measuring 3.5 cm. The portal vein is patent. No signs of abdominal adenopathy. Other:  No free fluid or fluid collection. Musculoskeletal: No suspicious bone lesions identified. IMPRESSION: 1. Status post treatment of segment 8 right lobe of liver lesion. No evidence for residual or recurrent tumor. No new liver lesions identified. 2. Cholelithiasis. 3. 3.5 cm infrarenal abdominal aortic aneurysm. Recommend follow-up every 2 years. Reference: J Am Coll Radiol 29417;[id number] Aortic aneurysm NOS (ICD10-I71.9). 4.  Aortic Atherosclerosis (ICD10-I70.0). Electronically Signed   By: TKerby MoorsM.D.   On: 11/08/2021 09:54    Labs:  CBC: Recent Labs    09/03/21 0527 09/04/21 0510 09/05/21 0541 09/06/21 0420  WBC 17.2* 11.2* 12.1* 14.5*  HGB 11.1* 10.9* 11.2* 11.7*  HCT 33.2* 32.6* 33.7* 35.3*  PLT 217 232 237 294    COAGS: Recent Labs    01/28/21 1435 04/27/21 0950 08/05/21 1310 08/31/21 1343  INR 1.0 0.9 1.1 1.2  APTT  --   --   --  32    BMP: Recent Labs    09/01/21 0237 09/02/21 0519 09/03/21 0527 09/04/21 0510 09/15/21 1021  NA 134* 134* 134* 137 140  K 4.1 3.7 4.4 4.4 4.0  CL 104 104 105 106 103  CO2 '24 24 22 26 28  '$ GLUCOSE 120* 101* 90 92 134*  BUN 31* 33* 28* 23 12  CALCIUM 7.8* 8.1* 8.4* 8.5* 9.2  CREATININE 1.94* 1.40* 1.35* 1.14 1.21  GFRNONAA 37* 54* 57* >60  --     LIVER FUNCTION TESTS: Recent Labs    04/27/21 0950 08/05/21 1310 08/31/21 1343 09/02/21 0519 09/15/21 1021  BILITOT 0.4 0.4 2.2* 0.6 0.6  AST 16 19 241* 74* 11  ALT 15 18 165* 146* 16   ALKPHOS 71 62 98 78  --   PROT 7.2 7.8 6.8 5.7* 6.2  ALBUMIN 4.4 4.6 3.1* 2.5*  --     TUMOR MARKERS: Recent Labs    01/28/21 1435 09/15/21 1021  AFPTM 1.8 3.6    Assessment and Plan:  Steven DIMARTINOis a 71y.o. male with past medical history significant for hepatitis C (treated with Harvoni in 2018) and alcoholism who is post combined bland hepatic embolization and image guided microwave ablation on 08/18/2021 for worrisome  lesion within the dome of the anterior segment of the right lobe of the liver which demonstrates imaging characteristics compatible with a LI-RADS 5 lesion given provided history of hepatitis C and alcoholism.    The following examinations were reviewed in detail: Abdominal MRI - 11/06/2021; 11/28/2020 CT abdomen and pelvis - 06/24/2021; 08/31/2021  Intraprocedural images during bland embolization and fluoroscopic guided microwave ablation - 315/2020  Postprocedural abdominal MRI performed 11/06/2021 is negative for evidence of locally recurrent or residual disease.  Additionally, there are no new areas of abnormal enhancement to suggest development of an additional area of hepatocellular carcinoma.   The patient's AFP level is slightly elevated compared to preprocedural (3.6 on 09/15/2021, previously, 1.8 on 01/28/2021), however this is without imaging evidence of residual or progressive disease and as such will be continued to be monitored.  I once again explained to the patient that the procedure was performed with curative intent however as surgical margins were not obtained (given the nature of the procedure), we will ensure treatment with surveillance abdominal imaging.  I explained that imaging was performed on a every 3 month basis for the first year followed by biannual for the second year and a final surveillance examination 1 year following the ablation.  I explained that given his history of hepatitis C and cirrhosis, he is at risk for developing hepatocellular  carcinoma at other locations however presently this is the only lesion that has been identified.   The next surveillance abdominal MRI were performed in 3 months, September 2023.  He patient demonstrated good understanding of the above conversation and is agreement with the proposed plan of care.  Plan: - Follow-up telemedicine consultation following acquisition of next postprocedural AFP level and surveillance abdominal MRI to be performed in September 2023   A copy of this report was sent to the requesting provider on this date.  Electronically Signed: Sandi Mariscal 11/12/2021, 9:10 AM   I spent a total of 10 Minutes in remote  clinical consultation, greater than 50% of which was counseling/coordinating care for post hepatic bland embolization and microwave ablation.    Visit type: Audio only (telephone). Audio (no video) only due to patient's lack of internet/smartphone capability. Alternative for in-person consultation at [facility], [address] [place], [place], [place]. This visit type was conducted due to national recommendations for restrictions regarding the COVID-19 Pandemic (e.g. social distancing).  This format is felt to be most appropriate for this patient at this time.  All issues noted in this document were discussed and addressed.

## 2021-11-15 DIAGNOSIS — Z79891 Long term (current) use of opiate analgesic: Secondary | ICD-10-CM | POA: Diagnosis not present

## 2021-11-15 DIAGNOSIS — M545 Low back pain, unspecified: Secondary | ICD-10-CM | POA: Diagnosis not present

## 2021-11-15 DIAGNOSIS — G894 Chronic pain syndrome: Secondary | ICD-10-CM | POA: Diagnosis not present

## 2021-12-06 DIAGNOSIS — R918 Other nonspecific abnormal finding of lung field: Secondary | ICD-10-CM | POA: Diagnosis not present

## 2021-12-06 DIAGNOSIS — N1832 Chronic kidney disease, stage 3b: Secondary | ICD-10-CM | POA: Diagnosis not present

## 2021-12-27 DIAGNOSIS — I129 Hypertensive chronic kidney disease with stage 1 through stage 4 chronic kidney disease, or unspecified chronic kidney disease: Secondary | ICD-10-CM | POA: Diagnosis not present

## 2021-12-27 DIAGNOSIS — E875 Hyperkalemia: Secondary | ICD-10-CM | POA: Diagnosis not present

## 2021-12-27 DIAGNOSIS — N1832 Chronic kidney disease, stage 3b: Secondary | ICD-10-CM | POA: Diagnosis not present

## 2021-12-27 DIAGNOSIS — D638 Anemia in other chronic diseases classified elsewhere: Secondary | ICD-10-CM | POA: Diagnosis not present

## 2022-01-07 DIAGNOSIS — I10 Essential (primary) hypertension: Secondary | ICD-10-CM | POA: Diagnosis not present

## 2022-01-07 DIAGNOSIS — C220 Liver cell carcinoma: Secondary | ICD-10-CM | POA: Diagnosis not present

## 2022-01-07 DIAGNOSIS — K7460 Unspecified cirrhosis of liver: Secondary | ICD-10-CM | POA: Diagnosis not present

## 2022-01-07 DIAGNOSIS — N1832 Chronic kidney disease, stage 3b: Secondary | ICD-10-CM | POA: Diagnosis not present

## 2022-01-07 DIAGNOSIS — F17210 Nicotine dependence, cigarettes, uncomplicated: Secondary | ICD-10-CM | POA: Diagnosis not present

## 2022-01-08 DIAGNOSIS — E875 Hyperkalemia: Secondary | ICD-10-CM | POA: Diagnosis not present

## 2022-01-08 DIAGNOSIS — N170 Acute kidney failure with tubular necrosis: Secondary | ICD-10-CM | POA: Diagnosis not present

## 2022-01-08 DIAGNOSIS — I129 Hypertensive chronic kidney disease with stage 1 through stage 4 chronic kidney disease, or unspecified chronic kidney disease: Secondary | ICD-10-CM | POA: Diagnosis not present

## 2022-01-08 DIAGNOSIS — N1832 Chronic kidney disease, stage 3b: Secondary | ICD-10-CM | POA: Diagnosis not present

## 2022-01-08 DIAGNOSIS — D638 Anemia in other chronic diseases classified elsewhere: Secondary | ICD-10-CM | POA: Diagnosis not present

## 2022-01-24 DIAGNOSIS — N1832 Chronic kidney disease, stage 3b: Secondary | ICD-10-CM | POA: Diagnosis not present

## 2022-01-24 DIAGNOSIS — N170 Acute kidney failure with tubular necrosis: Secondary | ICD-10-CM | POA: Diagnosis not present

## 2022-01-24 DIAGNOSIS — D638 Anemia in other chronic diseases classified elsewhere: Secondary | ICD-10-CM | POA: Diagnosis not present

## 2022-01-24 DIAGNOSIS — I129 Hypertensive chronic kidney disease with stage 1 through stage 4 chronic kidney disease, or unspecified chronic kidney disease: Secondary | ICD-10-CM | POA: Diagnosis not present

## 2022-01-24 DIAGNOSIS — E875 Hyperkalemia: Secondary | ICD-10-CM | POA: Diagnosis not present

## 2022-01-28 ENCOUNTER — Other Ambulatory Visit: Payer: Self-pay | Admitting: Interventional Radiology

## 2022-01-28 ENCOUNTER — Other Ambulatory Visit: Payer: Self-pay | Admitting: Nurse Practitioner

## 2022-01-28 DIAGNOSIS — K769 Liver disease, unspecified: Secondary | ICD-10-CM

## 2022-01-31 DIAGNOSIS — M545 Low back pain, unspecified: Secondary | ICD-10-CM | POA: Diagnosis not present

## 2022-01-31 DIAGNOSIS — Z79891 Long term (current) use of opiate analgesic: Secondary | ICD-10-CM | POA: Diagnosis not present

## 2022-01-31 DIAGNOSIS — G894 Chronic pain syndrome: Secondary | ICD-10-CM | POA: Diagnosis not present

## 2022-02-02 DIAGNOSIS — N170 Acute kidney failure with tubular necrosis: Secondary | ICD-10-CM | POA: Diagnosis not present

## 2022-02-02 DIAGNOSIS — I129 Hypertensive chronic kidney disease with stage 1 through stage 4 chronic kidney disease, or unspecified chronic kidney disease: Secondary | ICD-10-CM | POA: Diagnosis not present

## 2022-02-02 DIAGNOSIS — N1832 Chronic kidney disease, stage 3b: Secondary | ICD-10-CM | POA: Diagnosis not present

## 2022-02-02 DIAGNOSIS — E875 Hyperkalemia: Secondary | ICD-10-CM | POA: Diagnosis not present

## 2022-02-12 DIAGNOSIS — I10 Essential (primary) hypertension: Secondary | ICD-10-CM | POA: Diagnosis not present

## 2022-02-12 DIAGNOSIS — N1831 Chronic kidney disease, stage 3a: Secondary | ICD-10-CM | POA: Diagnosis not present

## 2022-03-14 DIAGNOSIS — N1832 Chronic kidney disease, stage 3b: Secondary | ICD-10-CM | POA: Diagnosis not present

## 2022-03-14 DIAGNOSIS — I10 Essential (primary) hypertension: Secondary | ICD-10-CM | POA: Diagnosis not present

## 2022-03-15 ENCOUNTER — Ambulatory Visit
Admission: RE | Admit: 2022-03-15 | Discharge: 2022-03-15 | Disposition: A | Discharge status: Home / Self Care | Payer: Medicare Other | Source: Ambulatory Visit | Attending: Nurse Practitioner | Admitting: Nurse Practitioner

## 2022-03-15 DIAGNOSIS — K8020 Calculus of gallbladder without cholecystitis without obstruction: Secondary | ICD-10-CM | POA: Diagnosis not present

## 2022-03-15 DIAGNOSIS — K769 Liver disease, unspecified: Secondary | ICD-10-CM

## 2022-03-15 DIAGNOSIS — N281 Cyst of kidney, acquired: Secondary | ICD-10-CM | POA: Diagnosis not present

## 2022-03-15 MED ORDER — GADOBENATE DIMEGLUMINE 529 MG/ML IV SOLN
15.0000 mL | Freq: Once | INTRAVENOUS | Status: AC | PRN
  Administered 2022-03-15: 15 mL via INTRAVENOUS

## 2022-03-22 ENCOUNTER — Ambulatory Visit
Admission: RE | Admit: 2022-03-22 | Discharge: 2022-03-22 | Disposition: A | Discharge status: Home / Self Care | Payer: Medicare Other | Source: Ambulatory Visit | Attending: Interventional Radiology | Admitting: Interventional Radiology

## 2022-03-22 ENCOUNTER — Other Ambulatory Visit: Payer: Self-pay | Admitting: *Deleted

## 2022-03-22 ENCOUNTER — Encounter: Payer: Self-pay | Admitting: Lab

## 2022-03-22 DIAGNOSIS — C220 Liver cell carcinoma: Secondary | ICD-10-CM | POA: Diagnosis not present

## 2022-03-22 DIAGNOSIS — K769 Liver disease, unspecified: Secondary | ICD-10-CM

## 2022-03-22 DIAGNOSIS — Z9889 Other specified postprocedural states: Secondary | ICD-10-CM | POA: Diagnosis not present

## 2022-03-22 DIAGNOSIS — I723 Aneurysm of iliac artery: Secondary | ICD-10-CM

## 2022-03-22 HISTORY — PX: IR RADIOLOGIST EVAL & MGMT: IMG5224

## 2022-03-22 NOTE — Progress Notes (Signed)
Patient ID: Steven [name], male   DOB: 1951/02/29, 71 y.o.   MRN: [id number]        Chief Complaint: Hepatocellular carcinoma, post bland embolization and microwave ablation on 08/18/2021.  Referring Physician(s): Drazek,Dawn   History of Present Illness: Steven [name] is a 71 y.o. male with past medical history significant for hepatitis C (treated with Harvoni in 2018) and alcoholism who is post combined bland hepatic embolization and image guided microwave ablation on 08/18/2021 for worrisome lesion within the dome of the anterior segment of the right lobe of the liver which demonstrates imaging characteristics compatible with a LI-RADS 5 lesion given provided history of hepatitis C and alcoholism.  The patient is seen today in telemedicine consultation following acquisition of surveillance MRI performed 03/15/2022.  The patient is once again without complaint.  He has returned to all preprocedural energy and activity levels.  No unintentional weight loss or weight gain.  No yellowing of the skin or eyes.  No increased abdominal girth.  No bloody or melanotic stools.   Patient reports continued abstinence from alcohol and additionally reports he has quit smoking.  Past Medical History:  Diagnosis Date   Cancer ([place])    skin adn liver   EtOH dependence ([place])    Hepatitis C    successfully eradicated with Harvoni in 2018 with SVR documented   Hypertension    Shoulder impingement 2004    Past Surgical History:  Procedure Laterality Date   COLONOSCOPY WITH PROPOFOL N/A 05/03/2021   Procedure: COLONOSCOPY WITH PROPOFOL;  Surgeon: Eloise Harman, DO;  [facility];  Service: Endoscopy;  Laterality: N/A;  7:30 / ASA III   FRACTURE SURGERY Left    wrist   IR 3D INDEPENDENT WKST  08/18/2021   IR ANGIOGRAM SELECTIVE EACH ADDITIONAL VESSEL  08/18/2021   IR ANGIOGRAM SELECTIVE EACH ADDITIONAL VESSEL  08/18/2021   IR ANGIOGRAM VISCERAL SELECTIVE  08/18/2021   IR EMBO TUMOR  ORGAN ISCHEMIA INFARCT INC GUIDE ROADMAPPING  08/18/2021   IR RADIOLOGIST EVAL & MGMT  05/18/2021   IR RADIOLOGIST EVAL & MGMT  09/20/2021   IR RADIOLOGIST EVAL & MGMT  11/12/2021   IR US GUIDE TISSUE ABLATION  08/18/2021   IR US GUIDE VASC ACCESS LEFT  08/18/2021   POLYPECTOMY  05/03/2021   Procedure: POLYPECTOMY;  Surgeon: Eloise Harman, DO;  [facility];  Service: Endoscopy;;  ascending,descending   RADIOFREQUENCY ABLATION N/A 08/18/2021   Procedure: MICROWAVE  ABLATION;  Surgeon: Sandi Mariscal, MD;  Location: [facility];  Service: Anesthesiology;  Laterality: N/A;   SHOULDER ACROMIOPLASTY Right 11/20/2012   Procedure: SHOULDER ACROMIOPLASTY;  Surgeon: Sanjuana Kava, MD;  Location: [facility];  Service: Orthopedics;  Laterality: Right;   SKIN CANCER EXCISION Left 2013   wrist surgery fell off a roof   STOMACH SURGERY     had bleeding ulcers repaired. age 76   TOTAL KNEE ARTHROPLASTY Right 11/04/2015   Procedure: RIGHT TOTAL KNEE ARTHROPLASTY;  Surgeon: Carole Civil, MD;  Location: [facility];  Service: Orthopedics;  Laterality: Right;    Allergies: Patient has no known allergies.  Medications: Prior to Admission medications   Medication Sig Start Date End Date Taking? Authorizing Provider  aspirin-acetaminophen-caffeine (EXCEDRIN MIGRAINE) [phone] MG tablet Take 1 tablet by mouth every 6 (six) hours as needed for headache or migraine.    [provider]  buPROPion (WELLBUTRIN XL) 300 MG 24 hr tablet Take 300 mg by mouth every morning. 09/18/20  [provider]  cholecalciferol (VITAMIN D3) 25 MCG (1000 UNIT) tablet Take 1,000 Units by mouth daily.    [provider]  cloNIDine (CATAPRES) 0.1 MG tablet Take 0.1 mg by mouth at bedtime. 07/31/16   [provider]  esomeprazole (NEXIUM) 20 MG capsule Take 20 mg by mouth daily as needed (acid reflux).    [provider]  furosemide (LASIX) 20 MG tablet Take 10 mg by mouth 2 (two) times daily.  08/04/20   [provider]  gabapentin (NEURONTIN) 600 MG tablet Take 600 mg by mouth 3 (three) times daily. 09/18/20   [provider]  lisinopril (ZESTRIL) 5 MG tablet Take 5 mg by mouth daily. 08/04/20   [provider]  SUBOXONE 8-2 MG FILM Take 1 Film by mouth daily. 07/25/16   [provider]  triamcinolone cream (KENALOG) 0.1 % Apply 1 application topically daily as needed (rash/itching).    [provider]  VELTASSA 8.4 g packet Take 8.4 g by mouth 2 (two) times a week. Pt states at preop he takes it 2 times per week not on a regular schedule. 03/29/21   [provider]     Family History  Problem Relation Age of Onset   Liver disease Neg Hx    Colon cancer Neg Hx     Social History   Socioeconomic History   Marital status: Divorced    Spouse name: Not on file   Number of children: Not on file   Years of education: Not on file   Highest education level: Not on file  Occupational History   Not on file  Tobacco Use   Smoking status: Every Day    Packs/day: 1.00    Years: 49.00    Total pack years: 49.00    Types: Cigars, Cigarettes   Smokeless tobacco: Never  Vaping Use   Vaping Use: Never used  Substance and Sexual Activity   Alcohol use: Not Currently    Comment: 09/22/20 stopped drinking beer   Drug use: Yes    Types: Marijuana    Comment: every 3-4 months   Sexual activity: Yes  Other Topics Concern   Not on file  Social History Narrative   Not on file   Social Determinants of Health   Financial Resource Strain: Not on file  Food Insecurity: Not on file  Transportation Needs: Not on file  Physical Activity: Not on file  Stress: Not on file  Social Connections: Not on file    ECOG Status: 0 - Asymptomatic  Review of Systems  Review of Systems: A 12 point ROS discussed and pertinent positives are indicated in the HPI above.  All other systems are negative.  Physical Exam No direct physical exam was  performed (except for noted visual exam findings with Video Visits).   Vital Signs: There were no vitals taken for this visit.  Imaging:  Following examinations were reviewed in detail:  Abdominal MRI - 03/15/2022; 11/06/2021; 11/28/2020 CT abdomen and pelvis - 06/24/2021; 08/31/2021  Intraprocedural images during bland embolization and fluoroscopic guided microwave ablation - 315/2020  Personal review of most recent surveillance abdominal MRI performed 03/15/2022 is without definitive evidence of residual or locally recurrent disease.  There are no new areas of abnormal enhancement to suggest development of multifocal hepatocellular carcinoma.  MR ABDOMEN WWO CONTRAST  Result Date: 03/16/2022 CLINICAL DATA:  Follow-up hepatic lesion treated with microwave ablation and bland embolization in the right lobe of the liver. EXAM: MRI ABDOMEN  WITHOUT AND WITH CONTRAST TECHNIQUE: Multiplanar multisequence MR imaging of the abdomen was performed both before and after the administration of intravenous contrast. CONTRAST:  45m MULTIHANCE GADOBENATE DIMEGLUMINE 529 MG/ML IV SOLN COMPARISON:  Multiple priors including most recent MRI November 06, 2021 FINDINGS: Lower chest: Elevation of the left hemidiaphragm. Heterogeneous signal in the lung bases likely reflects atelectasis. Hepatobiliary: No significant hepatic steatosis. Slight interval involution of the treated segment VIII hepatic lesion which now measures 16 x 11 mm on image 31/11 previously 20 x 12 mm which now demonstrates a stellate hypointense T2 signal lesion with mild internal T1 heterogeneity which persists throughout all postcontrast T1 pulse sequences. Additionally, there is no associated restricted diffusion within the lesion. No new suspicious hepatic lesion. Cholelithiasis without findings of acute cholecystitis. Adenomyomatosis of the gallbladder fundus. No biliary ductal dilation. Pancreas: No pancreatic ductal dilation or evidence of acute  inflammation. Spleen:  No splenomegaly or focal splenic lesion. Adrenals/Urinary Tract: Bilateral adrenal glands appear normal. No hydronephrosis. Stable 10 mm left upper pole renal cyst and other bilateral fluid density renal lesions which are technically too small to accurately characterize but statistically likely reflect cysts and considered benign requiring no independent imaging follow-up Stomach/Bowel: Stomach is unremarkable for degree of distension. No pathologic dilation or evidence of acute inflammation involving loops of large or small bowel in the abdomen. Moderate volume of formed stool throughout the colon. Vascular/Lymphatic: Stable infrarenal abdominal aortic aneurysm measuring 3.4 cm previously 3.5 cm no abdominal adenopathy. Other:  No significant abdominal free fluid. Musculoskeletal: No suspicious bone lesions identified. IMPRESSION: 1. Slight interval involution of the treated segment VIII hepatic lesion which now demonstrates a stellate hypointense T2 signal lesion with mild internal T1 heterogeneity which persists throughout all postcontrast pulse sequences and is without reduced diffusivity. Findings which are favored to reflect post treatment changes. However, given the intrinsic T1 heterogeneity and motion artifact limiting subtraction sequences recurrent disease while not favored is not completely excluded. Recommend close interval follow up pre and post contrast MRI in 3 months to assess stability. 2. Cholelithiasis without findings of acute cholecystitis. 3. Stable infrarenal abdominal aortic aneurysm measuring 3.4 cm. Recommend follow-up ultrasound every 3 years. This recommendation follows ACR consensus guidelines: White Paper of the ACR Incidental Findings Committee II on Vascular Findings. J Am Coll Radiol 2013; 10:789-794. Electronically Signed   By: JDahlia BailiffM.D.   On: 03/16/2022 15:19    Labs:  CBC: Recent Labs    09/03/21 0527 09/04/21 0510 09/05/21 0541  09/06/21 0420  WBC 17.2* 11.2* 12.1* 14.5*  HGB 11.1* 10.9* 11.2* 11.7*  HCT 33.2* 32.6* 33.7* 35.3*  PLT 217 232 237 294    COAGS: Recent Labs    04/27/21 0950 08/05/21 1310 08/31/21 1343  INR 0.9 1.1 1.2  APTT  --   --  32    BMP: Recent Labs    09/01/21 0237 09/02/21 0519 09/03/21 0527 09/04/21 0510 09/15/21 1021  NA 134* 134* 134* 137 140  K 4.1 3.7 4.4 4.4 4.0  CL 104 104 105 106 103  CO2 '24 24 22 26 28  '$ GLUCOSE 120* 101* 90 92 134*  BUN 31* 33* 28* 23 12  CALCIUM 7.8* 8.1* 8.4* 8.5* 9.2  CREATININE 1.94* 1.40* 1.35* 1.14 1.21  GFRNONAA 37* 54* 57* >60  --     LIVER FUNCTION TESTS: Recent Labs    04/27/21 0950 08/05/21 1310 08/31/21 1343 09/02/21 0519 09/15/21 1021  BILITOT 0.4 0.4 2.2* 0.6 0.6  AST  16 19 241* 74* 11  ALT 15 18 165* 146* 16  ALKPHOS 71 62 98 78  --   PROT 7.2 7.8 6.8 5.7* 6.2  ALBUMIN 4.4 4.6 3.1* 2.5*  --     TUMOR MARKERS: Recent Labs    09/15/21 1021  AFPTM 3.6    Assessment and Plan:  SUHAN PACI is a 71 y.o. male with past medical history significant for hepatitis C (treated with Harvoni in 2018) and alcoholism who is post combined bland hepatic embolization and image guided microwave ablation on 08/18/2021 for worrisome lesion within the dome of the anterior segment of the right lobe of the liver which demonstrates imaging characteristics compatible with a LI-RADS 5 lesion given provided history of hepatitis C and alcoholism.  The patient is seen today in telemedicine consultation following acquisition of surveillance MRI performed 03/15/2022.  The patient is once again without complaint.  He has returned to all preprocedural energy and activity levels.  The patient reports continued abstinence from alcohol and additionally reports he has quit smoking.  Following examinations were reviewed in detail:  Abdominal MRI - 03/15/2022; 11/06/2021; 11/28/2020 CT abdomen and pelvis - 06/24/2021; 08/31/2021  Intraprocedural  images during bland embolization and fluoroscopic guided microwave ablation - 315/2020  Personal review of most recent surveillance abdominal MRI performed 03/15/2022 is without definitive evidence of residual or locally recurrent disease.  There are no new areas of abnormal enhancement to suggest development of multifocal hepatocellular carcinoma.  Given above, we will continue with surveillance imaging of the patient with acquisition of an abdominal MRI in 3 months (mid January 2024.  We will also obtain an AFP level and a CMP at that time.  Patient knows to call the interventional radiology clinic with any interval questions or concerns.  Plan: Follow-up telemedicine consultation in 3 months (mid January 2024) following acquisition of surveillance abdominal MRI, AFP level and CMP.  A copy of this report was sent to the requesting provider on this date.  Electronically Signed: Sandi Mariscal 03/22/2022, 8:30 AM   I spent a total of 10 Minutes in remote  clinical consultation, greater than 50% of which was counseling/coordinating care for percutaneous management of [place].    Visit type: Audio only (telephone). Audio (no video) only due to patient's lack of internet/smartphone capability. Alternative for in-person consultation at [facility], [address] [place], [place], [place]. This visit type was conducted due to national recommendations for restrictions regarding the COVID-19 Pandemic (e.g. social distancing).  This format is felt to be most appropriate for this patient at this time.  All issues noted in this document were discussed and addressed.

## 2022-03-29 NOTE — Progress Notes (Deleted)
HISTORY AND PHYSICAL     CC:  follow up. Requesting Provider:  [name]*  HPI: This is a 71 y.o. [name] who is here today for right CIA aneurysm.  He was seen in June 2022 by Dr. Donzetta Matters and at that time, the right CIA aneurysm was 2.1cm and had minimally enlarged since 2007.  The left measured 0.8cm.  If it reaches 3cm, we will get CT scan at that time.  It was discussed if there was no change at this visit, we could change to every 2 years for surveillance.  He discussed smoking cessation with pt.   The pt returns today for follow up.  ***  The pt is not on a statin for cholesterol management.    The pt is not on an aspirin.    Other AC:  none The pt is on ACEI, clonidine for hypertension.  The pt does not have diabetes. Tobacco hx:  current  Pt does *** have family hx of AAA.  Past Medical History:  Diagnosis Date   Cancer ([place])    skin adn liver   EtOH dependence ([place])    Hepatitis C    successfully eradicated with Harvoni in 2018 with SVR documented   Hypertension    Shoulder impingement 2004    Past Surgical History:  Procedure Laterality Date   COLONOSCOPY WITH PROPOFOL N/A 11/Steven/2022   Procedure: COLONOSCOPY WITH PROPOFOL;  Surgeon: Eloise Harman, DO;  [facility];  Service: Endoscopy;  Laterality: N/A;  7:30 / ASA III   FRACTURE SURGERY Left    wrist   IR 3D INDEPENDENT WKST  08/18/2021   IR ANGIOGRAM SELECTIVE EACH ADDITIONAL VESSEL  08/18/2021   IR ANGIOGRAM SELECTIVE EACH ADDITIONAL VESSEL  08/18/2021   IR ANGIOGRAM VISCERAL SELECTIVE  08/18/2021   IR EMBO TUMOR ORGAN ISCHEMIA INFARCT INC GUIDE ROADMAPPING  08/18/2021   IR RADIOLOGIST EVAL & MGMT  05/18/2021   IR RADIOLOGIST EVAL & MGMT  09/20/2021   IR RADIOLOGIST EVAL & MGMT  11/12/2021   IR RADIOLOGIST EVAL & MGMT  03/22/2022   IR US GUIDE TISSUE ABLATION  08/18/2021   IR US GUIDE VASC ACCESS LEFT  08/18/2021   POLYPECTOMY  11/Steven/2022   Procedure: POLYPECTOMY;  Surgeon: Eloise Harman, DO;  [facility];  Service: Endoscopy;;  ascending,descending   RADIOFREQUENCY ABLATION N/A 08/18/2021   Procedure: MICROWAVE  ABLATION;  Surgeon: Sandi Mariscal, MD;  [facility];  Service: Anesthesiology;  Laterality: N/A;   SHOULDER ACROMIOPLASTY Right 11/20/2012   Procedure: SHOULDER ACROMIOPLASTY;  Surgeon: Sanjuana Kava, MD;  Location: [facility];  Service: Orthopedics;  Laterality: Right;   SKIN CANCER EXCISION Left 2013   wrist surgery fell off a roof   STOMACH SURGERY     had bleeding ulcers repaired. age 47   TOTAL KNEE ARTHROPLASTY Right 11/04/2015   Procedure: RIGHT TOTAL KNEE ARTHROPLASTY;  Surgeon: Carole Civil, MD;  Location: [facility];  Service: Orthopedics;  Laterality: Right;    No Known Allergies  Current Outpatient Medications  Medication Sig Dispense Refill   aspirin-acetaminophen-caffeine (EXCEDRIN MIGRAINE) 250-250-65 MG tablet Take 1 tablet by mouth every 6 (six) hours as needed for headache or migraine.     buPROPion (WELLBUTRIN XL) 300 MG 24 hr tablet Take 300 mg by mouth every morning.     cholecalciferol (VITAMIN D3) 25 MCG (1000 UNIT) tablet Take 1,000 Units by mouth daily.     cloNIDine (CATAPRES) 0.1 MG tablet  Take 0.1 mg by mouth at bedtime.     esomeprazole (NEXIUM) 20 MG capsule Take 20 mg by mouth daily as needed (acid reflux).     furosemide (LASIX) 20 MG tablet Take 10 mg by mouth 2 (two) times daily.     gabapentin (NEURONTIN) 600 MG tablet Take 600 mg by mouth 3 (three) times daily.     lisinopril (ZESTRIL) 5 MG tablet Take 5 mg by mouth daily.     SUBOXONE 8-2 MG FILM Take 1 Film by mouth daily.     triamcinolone cream (KENALOG) 0.1 % Apply 1 application topically daily as needed (rash/itching).     VELTASSA 8.4 g packet Take 8.4 g by mouth 2 (two) times a week. Pt states at preop he takes it 2 times per week not on a regular schedule.     No current facility-administered medications for this visit.    Family History  Problem  Relation Age of Onset   Liver disease Neg Hx    Colon cancer Neg Hx     Social History   Socioeconomic History   Marital status: Divorced    Spouse name: Not on file   Number of children: Not on file   Years of education: Not on file   Highest education level: Not on file  Occupational History   Not on file  Tobacco Use   Smoking status: Every Day    Packs/day: 1.00    Years: 49.00    Total pack years: 49.00    Types: Cigars, Cigarettes   Smokeless tobacco: Never  Vaping Use   Vaping Use: Never used  Substance and Sexual Activity   Alcohol use: Not Currently    Comment: 09/22/20 stopped drinking beer   Drug use: Yes    Types: Marijuana    Comment: every 3-4 months   Sexual activity: Yes  Other Topics Concern   Not on file  Social History Narrative   Not on file   Social Determinants of Health   Financial Resource Strain: Not on file  Food Insecurity: Not on file  Transportation Needs: Not on file  Physical Activity: Not on file  Stress: Not on file  Social Connections: Not on file  Intimate Partner Violence: Not on file     REVIEW OF SYSTEMS:  *** '[X]'$  denotes positive finding, '[ ]'$  denotes negative finding Cardiac  Comments:  Chest pain or chest pressure:    Shortness of breath upon exertion:    Short of breath when lying flat:    Irregular heart rhythm:        Vascular    Pain in calf, thigh, or hip brought on by ambulation:    Pain in feet at night that wakes you up from your sleep:     Blood clot in your veins:    Leg swelling:         Pulmonary    Oxygen at home:    Productive cough:     Wheezing:         Neurologic    Sudden weakness in arms or legs:     Sudden numbness in arms or legs:     Sudden onset of difficulty speaking or slurred speech:    Temporary loss of vision in one eye:     Problems with dizziness:         Gastrointestinal    Blood in stool:     Vomited blood:         Genitourinary  Burning when urinating:     Blood  in urine:        Psychiatric    Major depression:         Hematologic    Bleeding problems:    Problems with blood clotting too easily:        Skin    Rashes or ulcers:        Constitutional    Fever or chills:      PHYSICAL EXAMINATION:  ***  General:  WDWN in NAD; vital signs documented above Gait: Not observed HENT: WNL, normocephalic Pulmonary: normal non-labored breathing  Cardiac: {Desc; regular/irreg:14544} HR, {With/Without:20273} carotid bruit*** Abdomen: soft, NT; aortic pulse is *** palpable Skin: {With/Without:20273} rashes Vascular Exam/Pulses:  Right Left  Radial {Exam; arterial pulse strength 0-4:30167} {Exam; arterial pulse strength 0-4:30167}  Femoral {Exam; arterial pulse strength 0-4:30167} {Exam; arterial pulse strength 0-4:30167}  Popliteal {Exam; arterial pulse strength 0-4:30167} {Exam; arterial pulse strength 0-4:30167}  DP {Exam; arterial pulse strength 0-4:30167} {Exam; arterial pulse strength 0-4:30167}  PT {Exam; arterial pulse strength 0-4:30167} {Exam; arterial pulse strength 0-4:30167}   Extremities: {With/Without:20273} ischemic changes, {With/Without:20273} Gangrene , {With/Without:20273} cellulitis; {With/Without:20273} open wounds Musculoskeletal: no muscle wasting or atrophy  Neurologic: A&O X 3;  No focal weakness or paresthesias are detected Psychiatric:  The pt has {Desc; normal/abnormal:11317::"Normal"} affect.   Non-Invasive Vascular Imaging:   AAA Arterial duplex on 03/30/2022: ***  Previous AAA arterial duplex on 07/08/2019: IMPRESSION: 2.1 cm aneurysm of the right common iliac artery by ultrasound. This measured roughly 1.7 cm by standard CT in 2007. Consider eventual correlation with CT angiography of the abdomen and pelvis and referral to vascular surgery.   ASSESSMENT/PLAN:: 71 y.o. [name] here for follow up for right CIA aneurysm   -*** -pt will f/u in *** with ***.   Leontine Locket, [facility] [phone]  Clinic MD:   Donzetta Matters

## 2022-03-30 ENCOUNTER — Ambulatory Visit: Payer: Medicare Other

## 2022-03-30 ENCOUNTER — Other Ambulatory Visit (HOSPITAL_COMMUNITY): Payer: Medicare Other

## 2022-04-14 DIAGNOSIS — I10 Essential (primary) hypertension: Secondary | ICD-10-CM | POA: Diagnosis not present

## 2022-04-14 DIAGNOSIS — N1832 Chronic kidney disease, stage 3b: Secondary | ICD-10-CM | POA: Diagnosis not present

## 2022-04-19 NOTE — Progress Notes (Unsigned)
Office Note     CC:  follow up Requesting Provider:  [name]*  HPI: Steven [name] is a 71 y.o. ([birth year]) male who presents for surveillance follow up of right common iliac artery aneurysm. On duplex this was last 2.1 cm on maximal diameter. He has not had any associated symptoms.   Today he denies any pain on ambulation, pain at rest or tissue loss. He is retired but he says he still stays fairly active. He does report occasional cramping in his legs, usually at night. He admits to not drinking much water. He does not have any back or abdominal pain. He does report quitting smoking in March, which I congratulated him on.   The pt is not on a statin for cholesterol management.  The pt is not on a daily aspirin.   Other AC:  none The pt is on ACEI for hypertension.   The pt is not diabetic.   Tobacco hx: Recently quit, March 2023  Past Medical History:  Diagnosis Date   Cancer [facility])    skin adn liver   EtOH dependence ([place])    Hepatitis C    successfully eradicated with Harvoni in 2018 with SVR documented   Hypertension    Shoulder impingement 2004    Past Surgical History:  Procedure Laterality Date   COLONOSCOPY WITH PROPOFOL N/A 05/03/2021   Procedure: COLONOSCOPY WITH PROPOFOL;  Surgeon: Eloise Harman, DO;  [facility];  Service: Endoscopy;  Laterality: N/A;  7:30 / ASA III   FRACTURE SURGERY Left    wrist   IR 3D INDEPENDENT WKST  08/18/2021   IR ANGIOGRAM SELECTIVE EACH ADDITIONAL VESSEL  08/18/2021   IR ANGIOGRAM SELECTIVE EACH ADDITIONAL VESSEL  08/18/2021   IR ANGIOGRAM VISCERAL SELECTIVE  08/18/2021   IR EMBO TUMOR ORGAN ISCHEMIA INFARCT INC GUIDE ROADMAPPING  08/18/2021   IR RADIOLOGIST EVAL & MGMT  05/18/2021   IR RADIOLOGIST EVAL & MGMT  09/20/2021   IR RADIOLOGIST EVAL & MGMT  11/12/2021   IR RADIOLOGIST EVAL & MGMT  03/22/2022   IR US GUIDE TISSUE ABLATION  08/18/2021   IR US GUIDE VASC ACCESS LEFT  08/18/2021   POLYPECTOMY   05/03/2021   Procedure: POLYPECTOMY;  Surgeon: Eloise Harman, DO;  [facility];  Service: Endoscopy;;  ascending,descending   RADIOFREQUENCY ABLATION N/A 08/18/2021   Procedure: MICROWAVE  ABLATION;  Surgeon: Sandi Mariscal, MD;  Location: [facility];  Service: Anesthesiology;  Laterality: N/A;   SHOULDER ACROMIOPLASTY Right 11/20/2012   Procedure: SHOULDER ACROMIOPLASTY;  Surgeon: Sanjuana Kava, MD;  Location: [facility];  Service: Orthopedics;  Laterality: Right;   SKIN CANCER EXCISION Left 2013   wrist surgery fell off a roof   STOMACH SURGERY     had bleeding ulcers repaired. age 42   TOTAL KNEE ARTHROPLASTY Right 11/04/2015   Procedure: RIGHT TOTAL KNEE ARTHROPLASTY;  Surgeon: Carole Civil, MD;  Location: [facility];  Service: Orthopedics;  Laterality: Right;    Social History   Socioeconomic History   Marital status: Divorced    Spouse name: Not on file   Number of children: Not on file   Years of education: Not on file   Highest education level: Not on file  Occupational History   Not on file  Tobacco Use   Smoking status: Every Day    Packs/day: 1.00    Years: 49.00    Total pack years: 49.00    Types: Cigars, Cigarettes  Smokeless tobacco: Never  Vaping Use   Vaping Use: Never used  Substance and Sexual Activity   Alcohol use: Not Currently    Comment: 09/22/20 stopped drinking beer   Drug use: Yes    Types: Marijuana    Comment: every 3-4 months   Sexual activity: Yes  Other Topics Concern   Not on file  Social History Narrative   Not on file   Social Determinants of Health   Financial Resource Strain: Not on file  Food Insecurity: Not on file  Transportation Needs: Not on file  Physical Activity: Not on file  Stress: Not on file  Social Connections: Not on file  Intimate Partner Violence: Not on file    Family History  Problem Relation Age of Onset   Liver disease Neg Hx    Colon cancer Neg Hx     Current Outpatient Medications  Medication  Sig Dispense Refill   aspirin-acetaminophen-caffeine (EXCEDRIN MIGRAINE) 250-250-65 MG tablet Take 1 tablet by mouth every 6 (six) hours as needed for headache or migraine.     buPROPion (WELLBUTRIN XL) 300 MG 24 hr tablet Take 300 mg by mouth every morning.     cholecalciferol (VITAMIN D3) 25 MCG (1000 UNIT) tablet Take 1,000 Units by mouth daily.     cloNIDine (CATAPRES) 0.1 MG tablet Take 0.1 mg by mouth at bedtime.     esomeprazole (NEXIUM) 20 MG capsule Take 20 mg by mouth daily as needed (acid reflux).     furosemide (LASIX) 20 MG tablet Take 10 mg by mouth 2 (two) times daily.     gabapentin (NEURONTIN) 600 MG tablet Take 600 mg by mouth 3 (three) times daily.     lisinopril (ZESTRIL) 5 MG tablet Take 5 mg by mouth daily.     SUBOXONE 8-2 MG FILM Take 1 Film by mouth daily.     triamcinolone cream (KENALOG) 0.1 % Apply 1 application topically daily as needed (rash/itching).     VELTASSA 8.4 g packet Take 8.4 g by mouth 2 (two) times a week. Pt states at preop he takes it 2 times per week not on a regular schedule.     No current facility-administered medications for this visit.    No Known Allergies   REVIEW OF SYSTEMS:  '[X]'$  denotes positive finding, '[ ]'$  denotes negative finding Cardiac  Comments:  Chest pain or chest pressure:    Shortness of breath upon exertion:    Short of breath when lying flat:    Irregular heart rhythm:        Vascular    Pain in calf, thigh, or hip brought on by ambulation:    Pain in feet at night that wakes you up from your sleep:     Blood clot in your veins:    Leg swelling:         Pulmonary    Oxygen at home:    Productive cough:     Wheezing:         Neurologic    Sudden weakness in arms or legs:     Sudden numbness in arms or legs:     Sudden onset of difficulty speaking or slurred speech:    Temporary loss of vision in one eye:     Problems with dizziness:         Gastrointestinal    Blood in stool:     Vomited blood:          Genitourinary    Burning  when urinating:     Blood in urine:        Psychiatric    Major depression:         Hematologic    Bleeding problems:    Problems with blood clotting too easily:        Skin    Rashes or ulcers:        Constitutional    Fever or chills:      PHYSICAL EXAMINATION:  Vitals:   04/20/22 0940  BP: (!) 144/82  Pulse: 67  Resp: 20  Temp: 97.6 F (36.4 C)  TempSrc: Temporal  SpO2: 99%  Weight: 173 lb 14.4 oz (78.9 kg)  Height: '5\' 10"'$  (1.778 m)    General:  WDWN in NAD; vital signs documented above Gait: Normal HENT: WNL, normocephalic Pulmonary: normal non-labored breathing  Cardiac: regular HR, without  Murmurs, without carotid bruit Abdomen: soft, NT, no masses; audible bruit, no palpable AAA Vascular Exam/Pulses:  Right Left  Radial 2+ (normal) 2+ (normal)  Femoral 2+ (normal) 2+ (normal)  Popliteal Not palpable Not palpable  DP 2+ (normal) 2+ (normal)  PT 2+ (normal) 2+ (normal)   Extremities: without ischemic changes, without Gangrene , without cellulitis; without open wounds;  Musculoskeletal: no muscle wasting or atrophy  Neurologic: A&O X 3;  No focal weakness or paresthesias are detected Psychiatric:  The pt has Normal affect.   Non-Invasive Vascular Imaging:   VAS [place] AAA duplex: Abdominal Aorta Findings:  +-----------+-------+----------+----------+--------+--------+--------+  Location  AP (cm)Trans (cm)PSV (cm/s)WaveformThrombusComments  +-----------+-------+----------+----------+--------+--------+--------+  Proximal  1.97   1.99      80                                  +-----------+-------+----------+----------+--------+--------+--------+  Mid       2.94   3.30      59                                  +-----------+-------+----------+----------+--------+--------+--------+  Distal    1.84   2.00      54                                   +-----------+-------+----------+----------+--------+--------+--------+  RT CIA Prox1.7    2.0       102       biphasic                  +-----------+-------+----------+----------+--------+--------+--------+  LT CIA Prox1.7    1.8       147       biphasic                  +-----------+-------+----------+----------+--------+--------+--------+   Summary:  Abdominal Aorta: There is evidence of abnormal dilatation of the mid Abdominal aorta.    ASSESSMENT/PLAN:: 71 y.o. male here for follow up of right common iliac artery aneurysm. On duplex this was last 2.1 cm on maximal diameter. He has not had any associated symptoms. Duplex shows stable right CIA aneurysm of 2.0 cm. He additionally has small AAA with max diameter of 3.3 cm.  - discussed signs and symptoms of rupture/ thrombosis and he understands to call for earlier follow up or seek immediate medical attention should this occur.  - re discussed that we would not intervene unless  aneurysm reaches 3 cm - He can follow up in 2 years with repeat AAA duplex   Karoline Caldwell, PA-C [facility] [phone]  Clinic MD:   Roxanne Mins

## 2022-04-20 ENCOUNTER — Ambulatory Visit (HOSPITAL_COMMUNITY)
Admission: RE | Admit: 2022-04-20 | Discharge: 2022-04-20 | Disposition: A | Discharge status: Home / Self Care | Payer: Medicare Other | Source: Ambulatory Visit | Attending: Vascular Surgery | Admitting: Vascular Surgery

## 2022-04-20 ENCOUNTER — Ambulatory Visit (INDEPENDENT_AMBULATORY_CARE_PROVIDER_SITE_OTHER): Payer: Medicare Other | Admitting: Physician Assistant

## 2022-04-20 VITALS — BP 144/82 | HR 67 | Temp 97.6°F | Resp 20 | Ht 70.0 in | Wt 173.9 lb

## 2022-04-20 DIAGNOSIS — I723 Aneurysm of iliac artery: Secondary | ICD-10-CM

## 2022-04-20 DIAGNOSIS — I714 Abdominal aortic aneurysm, without rupture, unspecified: Secondary | ICD-10-CM

## 2022-04-22 DIAGNOSIS — C220 Liver cell carcinoma: Secondary | ICD-10-CM | POA: Diagnosis not present

## 2022-04-22 DIAGNOSIS — K7469 Other cirrhosis of liver: Secondary | ICD-10-CM | POA: Diagnosis not present

## 2022-04-25 DIAGNOSIS — M545 Low back pain, unspecified: Secondary | ICD-10-CM | POA: Diagnosis not present

## 2022-04-25 DIAGNOSIS — G894 Chronic pain syndrome: Secondary | ICD-10-CM | POA: Diagnosis not present

## 2022-04-25 DIAGNOSIS — Z79891 Long term (current) use of opiate analgesic: Secondary | ICD-10-CM | POA: Diagnosis not present

## 2022-05-03 DIAGNOSIS — E875 Hyperkalemia: Secondary | ICD-10-CM | POA: Diagnosis not present

## 2022-05-03 DIAGNOSIS — I129 Hypertensive chronic kidney disease with stage 1 through stage 4 chronic kidney disease, or unspecified chronic kidney disease: Secondary | ICD-10-CM | POA: Diagnosis not present

## 2022-05-03 DIAGNOSIS — N170 Acute kidney failure with tubular necrosis: Secondary | ICD-10-CM | POA: Diagnosis not present

## 2022-05-03 DIAGNOSIS — N1832 Chronic kidney disease, stage 3b: Secondary | ICD-10-CM | POA: Diagnosis not present

## 2022-05-14 DIAGNOSIS — I10 Essential (primary) hypertension: Secondary | ICD-10-CM | POA: Diagnosis not present

## 2022-05-14 DIAGNOSIS — N1832 Chronic kidney disease, stage 3b: Secondary | ICD-10-CM | POA: Diagnosis not present

## 2022-05-20 DIAGNOSIS — E875 Hyperkalemia: Secondary | ICD-10-CM | POA: Diagnosis not present

## 2022-05-20 DIAGNOSIS — N1831 Chronic kidney disease, stage 3a: Secondary | ICD-10-CM | POA: Diagnosis not present

## 2022-05-20 DIAGNOSIS — I129 Hypertensive chronic kidney disease with stage 1 through stage 4 chronic kidney disease, or unspecified chronic kidney disease: Secondary | ICD-10-CM | POA: Diagnosis not present

## 2022-05-20 DIAGNOSIS — D638 Anemia in other chronic diseases classified elsewhere: Secondary | ICD-10-CM | POA: Diagnosis not present

## 2022-05-26 ENCOUNTER — Other Ambulatory Visit: Payer: Self-pay | Admitting: *Deleted

## 2022-05-26 ENCOUNTER — Other Ambulatory Visit: Payer: Self-pay | Admitting: Interventional Radiology

## 2022-05-26 DIAGNOSIS — C220 Liver cell carcinoma: Secondary | ICD-10-CM

## 2022-05-27 DIAGNOSIS — D638 Anemia in other chronic diseases classified elsewhere: Secondary | ICD-10-CM | POA: Diagnosis not present

## 2022-05-27 DIAGNOSIS — I129 Hypertensive chronic kidney disease with stage 1 through stage 4 chronic kidney disease, or unspecified chronic kidney disease: Secondary | ICD-10-CM | POA: Diagnosis not present

## 2022-05-27 DIAGNOSIS — E875 Hyperkalemia: Secondary | ICD-10-CM | POA: Diagnosis not present

## 2022-05-27 DIAGNOSIS — N1831 Chronic kidney disease, stage 3a: Secondary | ICD-10-CM | POA: Diagnosis not present

## 2022-06-13 DIAGNOSIS — C220 Liver cell carcinoma: Secondary | ICD-10-CM | POA: Diagnosis not present

## 2022-06-14 DIAGNOSIS — I10 Essential (primary) hypertension: Secondary | ICD-10-CM | POA: Diagnosis not present

## 2022-06-14 DIAGNOSIS — N1832 Chronic kidney disease, stage 3b: Secondary | ICD-10-CM | POA: Diagnosis not present

## 2022-06-15 LAB — COMPLETE METABOLIC PANEL WITH GFR
AG Ratio: 1.6 (calc) (ref 1.0–2.5)
ALT: 13 U/L (ref 9–46)
AST: 14 U/L (ref 10–35)
Albumin: 4.5 g/dL (ref 3.6–5.1)
Alkaline phosphatase (APISO): 67 U/L (ref 35–144)
BUN/Creatinine Ratio: 17 (calc) (ref 6–22)
BUN: 27 mg/dL — ABNORMAL HIGH (ref 7–25)
CO2: 27 mmol/L (ref 20–32)
Calcium: 9.4 mg/dL (ref 8.6–10.3)
Chloride: 104 mmol/L (ref 98–110)
Creat: 1.55 mg/dL — ABNORMAL HIGH (ref 0.70–1.28)
Globulin: 2.8 g/dL (calc) (ref 1.9–3.7)
Glucose, Bld: 138 mg/dL — ABNORMAL HIGH (ref 65–99)
Potassium: 4.6 mmol/L (ref 3.5–5.3)
Sodium: 142 mmol/L (ref 135–146)
Total Bilirubin: 0.4 mg/dL (ref 0.2–1.2)
Total Protein: 7.3 g/dL (ref 6.1–8.1)
eGFR: 48 mL/min/{1.73_m2} — ABNORMAL LOW (ref >=60)

## 2022-06-15 LAB — AFP TUMOR MARKER: AFP-Tumor Marker: 2.4 ng/mL (ref <6.1)

## 2022-06-18 ENCOUNTER — Ambulatory Visit
Admission: RE | Admit: 2022-06-18 | Discharge: 2022-06-18 | Disposition: A | Discharge status: Home / Self Care | Payer: 59 | Source: Ambulatory Visit | Attending: Interventional Radiology | Admitting: Interventional Radiology

## 2022-06-18 DIAGNOSIS — K7689 Other specified diseases of liver: Secondary | ICD-10-CM | POA: Diagnosis not present

## 2022-06-18 DIAGNOSIS — C220 Liver cell carcinoma: Secondary | ICD-10-CM

## 2022-06-18 MED ORDER — GADOPICLENOL 0.5 MMOL/ML IV SOLN
8.0000 mL | Freq: Once | INTRAVENOUS | Status: AC | PRN
  Administered 2022-06-18: 8 mL via INTRAVENOUS

## 2022-06-24 ENCOUNTER — Ambulatory Visit
Admission: RE | Admit: 2022-06-24 | Discharge: 2022-06-24 | Disposition: A | Discharge status: Home / Self Care | Payer: Medicare Other | Source: Ambulatory Visit | Attending: Interventional Radiology | Admitting: Interventional Radiology

## 2022-06-24 DIAGNOSIS — C220 Liver cell carcinoma: Secondary | ICD-10-CM | POA: Diagnosis not present

## 2022-06-24 NOTE — Progress Notes (Signed)
Patient ID: Steven [name], male   DOB: 1951/01/29, 72 y.o.   MRN: [id number]        Chief Complaint: Hepatocellular carcinoma, post bland embolization and microwave ablation on 08/18/2021.   Referring Physician(s): Drazek,Dawn   History of Present Illness: Steven [name] is a 72 y.o. male with past medical history significant for hepatitis C (treated with Harvoni in 2018) and alcoholism who is post combined bland hepatic embolization and image guided microwave ablation on 08/18/2021 for worrisome lesion within the dome of the anterior segment of the right lobe of the liver which demonstrates imaging characteristics compatible with a LI-RADS 5 lesion given provided history of hepatitis C and alcoholism.  The patient is seen today in telemedicine consultation following acquisition of surveillance MRI performed 06/18/2022.   The patient is once again without complaint.  He has returned to all preprocedural energy and activity levels.  No unintentional weight loss or weight gain.  No yellowing of the skin or eyes.  No increased abdominal girth.  No bloody or melanotic stools.   Patient reports continued abstinence from alcohol and has quit smoking nearly 1 year ago.  Past Medical History:  Diagnosis Date   Cancer ([place])    skin adn liver   EtOH dependence ([place])    Hepatitis C    successfully eradicated with Harvoni in 2018 with SVR documented   Hypertension    Shoulder impingement 2004    Past Surgical History:  Procedure Laterality Date   COLONOSCOPY WITH PROPOFOL N/A 05/03/2021   Procedure: COLONOSCOPY WITH PROPOFOL;  Surgeon: Eloise Harman, DO;  [facility];  Service: Endoscopy;  Laterality: N/A;  7:30 / ASA III   FRACTURE SURGERY Left    wrist   IR 3D INDEPENDENT WKST  08/18/2021   IR ANGIOGRAM SELECTIVE EACH ADDITIONAL VESSEL  08/18/2021   IR ANGIOGRAM SELECTIVE EACH ADDITIONAL VESSEL  08/18/2021   IR ANGIOGRAM VISCERAL SELECTIVE  08/18/2021   IR EMBO TUMOR ORGAN  ISCHEMIA INFARCT INC GUIDE ROADMAPPING  08/18/2021   IR RADIOLOGIST EVAL & MGMT  05/18/2021   IR RADIOLOGIST EVAL & MGMT  09/20/2021   IR RADIOLOGIST EVAL & MGMT  11/12/2021   IR RADIOLOGIST EVAL & MGMT  03/22/2022   IR US GUIDE TISSUE ABLATION  08/18/2021   IR US GUIDE VASC ACCESS LEFT  08/18/2021   POLYPECTOMY  05/03/2021   Procedure: POLYPECTOMY;  Surgeon: Eloise Harman, DO;  [facility];  Service: Endoscopy;;  ascending,descending   RADIOFREQUENCY ABLATION N/A 08/18/2021   Procedure: MICROWAVE  ABLATION;  Surgeon: Sandi Mariscal, MD;  Location[facility];  Service: Anesthesiology;  Laterality: N/A;   SHOULDER ACROMIOPLASTY Right 11/20/2012   Procedure: SHOULDER ACROMIOPLASTY;  Surgeon: Sanjuana Kava, MD;  Location: [facility];  Service: Orthopedics;  Laterality: Right;   SKIN CANCER EXCISION Left 2013   wrist surgery fell off a roof   STOMACH SURGERY     had bleeding ulcers repaired. age 89   TOTAL KNEE ARTHROPLASTY Right 11/04/2015   Procedure: RIGHT TOTAL KNEE ARTHROPLASTY;  Surgeon: Carole Civil, MD;  Location: [facility];  Service: Orthopedics;  Laterality: Right;    Allergies: Patient has no known allergies.  Medications: Prior to Admission medications   Medication Sig Start Date End Date Taking? Authorizing Provider  aspirin-acetaminophen-caffeine (EXCEDRIN MIGRAINE) [phone] MG tablet Take 1 tablet by mouth every 6 (six) hours as needed for headache or migraine.    [provider]  buPROPion (WELLBUTRIN XL) 300  MG 24 hr tablet Take 300 mg by mouth every morning. 09/18/20   [provider]  cholecalciferol (VITAMIN D3) 25 MCG (1000 UNIT) tablet Take 1,000 Units by mouth daily.    [provider]  cloNIDine (CATAPRES) 0.1 MG tablet Take 0.1 mg by mouth at bedtime. 07/31/16   [provider]  esomeprazole (NEXIUM) 20 MG capsule Take 20 mg by mouth daily as needed (acid reflux).    [provider]  furosemide (LASIX) 20 MG tablet Take  10 mg by mouth 2 (two) times daily. 08/04/20   [provider]  gabapentin (NEURONTIN) 600 MG tablet Take 600 mg by mouth 3 (three) times daily. 09/18/20   [provider]  lisinopril (ZESTRIL) 5 MG tablet Take 5 mg by mouth daily. 08/04/20   [provider]  SUBOXONE 8-2 MG FILM Take 1 Film by mouth daily. 07/25/16   [provider]  triamcinolone cream (KENALOG) 0.1 % Apply 1 application topically daily as needed (rash/itching).    [provider]  VELTASSA 8.4 g packet Take 8.4 g by mouth 2 (two) times a week. Pt states at preop he takes it 2 times per week not on a regular schedule. 03/29/21   [provider]     Family History  Problem Relation Age of Onset   Liver disease Neg Hx    Colon cancer Neg Hx     Social History   Socioeconomic History   Marital status: Divorced    Spouse name: Not on file   Number of children: Not on file   Years of education: Not on file   Highest education level: Not on file  Occupational History   Not on file  Tobacco Use   Smoking status: Every Day    Packs/day: 1.00    Years: 49.00    Total pack years: 49.00    Types: Cigars, Cigarettes   Smokeless tobacco: Never  Vaping Use   Vaping Use: Never used  Substance and Sexual Activity   Alcohol use: Not Currently    Comment: 09/22/20 stopped drinking beer   Drug use: Yes    Types: Marijuana    Comment: every 3-4 months   Sexual activity: Yes  Other Topics Concern   Not on file  Social History Narrative   Not on file   Social Determinants of Health   Financial Resource Strain: Not on file  Food Insecurity: Not on file  Transportation Needs: Not on file  Physical Activity: Not on file  Stress: Not on file  Social Connections: Not on file     Review of Systems  Review of Systems: A 12 point ROS discussed and pertinent positives are indicated in the HPI above.  All other systems are negative.   Physical Exam No direct physical exam  was performed (except for noted visual exam findings with Video Visits).   Vital Signs: There were no vitals taken for this visit.  Imaging:  Following examinations were reviewed in detail:  Abdominal MRI - 06/18/2022; 03/15/2022; 11/06/2021; 11/28/2020 CT abdomen and pelvis - 06/24/2021; 08/31/2021  Intraprocedural images during bland embolization and fluoroscopic guided microwave ablation - 08/18/2021   Personal review of most recent surveillance abdominal MRI performed 06/18/2022 is without definitive evidence of residual or locally recurrent disease.  There are no new areas of abnormal enhancement to suggest development of multifocal hepatocellular carcinoma.     MR ABDOMEN W WO CONTRAST  Result Date: 06/20/2022 CLINICAL DATA:  Follow-up hepatic lesion treated  with microwave ablation and bland embolization in the right lobe of the liver. EXAM: MRI ABDOMEN WITHOUT AND WITH CONTRAST TECHNIQUE: Multiplanar multisequence MR imaging of the abdomen was performed both before and after the administration of intravenous contrast. CONTRAST:  8 cc of Vueway COMPARISON:  Multiple priors including most recent MRI March 15, 2022. FINDINGS: Lower chest: Elevation of the left hemidiaphragm. Hepatobiliary: No significant hepatic steatosis. Similar appearance of the treated lesion in the right lobe of the liver measuring 16 x 10 mm on image 24/11 previously 16 x 11 mm. This is stellate in appearance with hypointense T2 signal and mild T1 heterogeneity which persists throughout all postcontrast pulse sequences without definite evidence of enhancement on subtraction sequences. No new suspicious hepatic lesion. Cholelithiasis in a distended gallbladder. Adenomyomatosis of the gallbladder fundus. Similar prominence of the extrahepatic biliary tree with the common duct measuring 7 mm, within normal limits for patient's age. Pancreas: No pancreatic ductal dilation or evidence of acute inflammation. Spleen:  No  splenomegaly or focal splenic lesion. Adrenals/Urinary Tract:  Bilateral adrenal glands appear normal. No hydronephrosis. Stable 11 mm left upper pole hemorrhagic/proteinaceous renal cysts and additional bilateral renal lesions which are technically too small to accurately characterize but statistically likely to reflect cysts these are considered benign and requiring no independent imaging follow-up. No solid enhancing renal mass. Stomach/Bowel: Visualized portions within the abdomen are unremarkable. Vascular/Lymphatic: Stable infrarenal abdominal aortic aneurysm measuring 3.5 cm no pathologically enlarged abdominal lymph nodes. Other:  No significant abdominal free fluid. Musculoskeletal: No suspicious bone lesions identified. IMPRESSION: 1. Similar appearance of the treated lesion in the right lobe of the liver without convincing evidence of disease recurrence. Recommend continued attention on follow-up imaging. 2. No new suspicious hepatic lesion. 3. Cholelithiasis in a distended gallbladder with adenomyomatosis of the gallbladder fundus. 4. Stable infrarenal abdominal aortic aneurysm measuring 3.5 cm. Recommend follow-up ultrasound every 2 years. This recommendation follows ACR consensus guidelines: White Paper of the ACR Incidental Findings Committee II on Vascular Findings. J Am Coll Radiol 2013; 10:789-794. Electronically Signed   By: Dahlia Bailiff M.D.   On: 06/20/2022 09:05    Labs:  CBC: Recent Labs    09/03/21 0527 09/04/21 0510 09/05/21 0541 09/06/21 0420  WBC 17.2* 11.2* 12.1* 14.5*  HGB 11.1* 10.9* 11.2* 11.7*  HCT 33.2* 32.6* 33.7* 35.3*  PLT 217 232 237 294    COAGS: Recent Labs    08/05/21 1310 08/31/21 1343  INR 1.1 1.2  APTT  --  32    BMP: Recent Labs    09/01/21 0237 09/02/21 0519 09/03/21 0527 09/04/21 0510 09/15/21 1021 06/13/22 1322  NA 134* 134* 134* 137 140 142  K 4.1 3.7 4.4 4.4 4.0 4.6  CL 104 104 105 106 103 104  CO2 '24 24 22 26 28 27  '$ GLUCOSE  120* 101* 90 92 134* 138*  BUN 31* 33* 28* 23 12 27*  CALCIUM 7.8* 8.1* 8.4* 8.5* 9.2 9.4  CREATININE 1.94* 1.40* 1.35* 1.14 1.21 1.55*  GFRNONAA 37* 54* 57* >60  --   --     LIVER FUNCTION TESTS: Recent Labs    08/05/21 1310 08/31/21 1343 09/02/21 0519 09/15/21 1021 06/13/22 1322  BILITOT 0.4 2.2* 0.6 0.6 0.4  AST 19 241* 74* 11 14  ALT 18 165* 146* 16 13  ALKPHOS 62 98 78  --   --   PROT 7.8 6.8 5.7* 6.2 7.3  ALBUMIN 4.6 3.1* 2.5*  --   --  TUMOR MARKERS: Recent Labs    09/15/21 1021 06/13/22 1322  AFPTM 3.6 2.4     Assessment and Plan:  Steven [name] is a 72 y.o. male with past medical history significant for hepatitis C (treated with Harvoni in 2018) and alcoholism who is post combined bland hepatic embolization and image guided microwave ablation on 08/18/2021 for worrisome lesion within the dome of the anterior segment of the right lobe of the liver which demonstrates imaging characteristics compatible with a LI-RADS 5 lesion given provided history of hepatitis C and alcoholism.  The patient is seen today in telemedicine consultation following acquisition of surveillance MRI performed 06/18/2022.   The patient is once again without complaint.  He has returned to all preprocedural energy and activity levels.  The patient reports continued abstinence from alcohol and reports he has quit smoking.  Following examinations were reviewed in detail:  Abdominal MRI - 06/18/2022; 03/15/2022; 11/06/2021; 11/28/2020 CT abdomen and pelvis - 06/24/2021; 08/31/2021  Intraprocedural images during bland embolization and fluoroscopic guided microwave ablation - 08/18/2021   Personal review of most recent surveillance abdominal MRI performed 06/18/2022 is without definitive evidence of residual or locally recurrent disease.  There are no new areas of abnormal enhancement to suggest development of multifocal hepatocellular carcinoma.  These findings are supported by patient's  persistently reduced AFP level of 2.4 (obtained 06/13/2022), though note, the patient's AFP was NOT elevated PRIOR to the intervention.  Note, patient bilirubin level remains normal at 0.4 (also obtained on 06/13/2022)  Given above, we will continue with surveillance imaging of the patient with acquisition of an abdominal MRI in 3 months (mid April 2024).  (As patient's LFTs were normal and his AFP was NOT elevated PRIOR to the procedure and remain low, we will NOT routinely obtain these labs going forward and will use AFP solely for problem solving purposes)   Patient knows to call the interventional radiology clinic with any interval questions or concerns.   Plan: Follow-up telemedicine consultation in 3 months (mid Apirl 2024) following acquisition of surveillance abdominal MRI.   A copy of this report was sent to the requesting provider on this date.  Electronically Signed: Sandi Mariscal 06/24/2022, 9:04 AM   I spent a total of 10 Minutes in remote  clinical consultation, greater than 50% of which was counseling/coordinating care for post bland and microwave hepatic ablation.    Visit type: Audio only (telephone). Audio (no video) only due to patient's lack of internet/smartphone capability. Alternative for in-person consultation at [facility], [address] [place], [place], [place]. This visit type was conducted due to national recommendations for restrictions regarding the COVID-19 Pandemic (e.g. social distancing).  This format is felt to be most appropriate for this patient at this time.  All issues noted in this document were discussed and addressed.

## 2022-07-14 DIAGNOSIS — I10 Essential (primary) hypertension: Secondary | ICD-10-CM | POA: Diagnosis not present

## 2022-07-14 DIAGNOSIS — N1832 Chronic kidney disease, stage 3b: Secondary | ICD-10-CM | POA: Diagnosis not present

## 2022-07-14 DIAGNOSIS — J439 Emphysema, unspecified: Secondary | ICD-10-CM | POA: Diagnosis not present

## 2022-07-14 DIAGNOSIS — Z0001 Encounter for general adult medical examination with abnormal findings: Secondary | ICD-10-CM | POA: Diagnosis not present

## 2022-07-14 DIAGNOSIS — I714 Abdominal aortic aneurysm, without rupture, unspecified: Secondary | ICD-10-CM | POA: Diagnosis not present

## 2022-07-14 DIAGNOSIS — Z1389 Encounter for screening for other disorder: Secondary | ICD-10-CM | POA: Diagnosis not present

## 2022-07-14 DIAGNOSIS — C220 Liver cell carcinoma: Secondary | ICD-10-CM | POA: Diagnosis not present

## 2022-08-12 DIAGNOSIS — N1832 Chronic kidney disease, stage 3b: Secondary | ICD-10-CM | POA: Diagnosis not present

## 2022-08-12 DIAGNOSIS — I10 Essential (primary) hypertension: Secondary | ICD-10-CM | POA: Diagnosis not present

## 2022-09-01 ENCOUNTER — Other Ambulatory Visit: Payer: Self-pay | Admitting: Interventional Radiology

## 2022-09-01 ENCOUNTER — Encounter: Payer: Self-pay | Admitting: Interventional Radiology

## 2022-09-01 DIAGNOSIS — C220 Liver cell carcinoma: Secondary | ICD-10-CM

## 2022-09-06 DIAGNOSIS — N1831 Chronic kidney disease, stage 3a: Secondary | ICD-10-CM | POA: Diagnosis not present

## 2022-09-06 DIAGNOSIS — E875 Hyperkalemia: Secondary | ICD-10-CM | POA: Diagnosis not present

## 2022-09-06 DIAGNOSIS — D638 Anemia in other chronic diseases classified elsewhere: Secondary | ICD-10-CM | POA: Diagnosis not present

## 2022-09-06 DIAGNOSIS — I129 Hypertensive chronic kidney disease with stage 1 through stage 4 chronic kidney disease, or unspecified chronic kidney disease: Secondary | ICD-10-CM | POA: Diagnosis not present

## 2022-09-12 DIAGNOSIS — I10 Essential (primary) hypertension: Secondary | ICD-10-CM | POA: Diagnosis not present

## 2022-09-12 DIAGNOSIS — N1832 Chronic kidney disease, stage 3b: Secondary | ICD-10-CM | POA: Diagnosis not present

## 2022-09-22 DIAGNOSIS — L84 Corns and callosities: Secondary | ICD-10-CM | POA: Diagnosis not present

## 2022-09-22 DIAGNOSIS — N1832 Chronic kidney disease, stage 3b: Secondary | ICD-10-CM | POA: Diagnosis not present

## 2022-09-22 DIAGNOSIS — I10 Essential (primary) hypertension: Secondary | ICD-10-CM | POA: Diagnosis not present

## 2022-10-01 ENCOUNTER — Other Ambulatory Visit: Payer: 59

## 2022-10-06 ENCOUNTER — Other Ambulatory Visit: Payer: 59

## 2022-10-11 DIAGNOSIS — N1832 Chronic kidney disease, stage 3b: Secondary | ICD-10-CM | POA: Diagnosis not present

## 2022-10-11 DIAGNOSIS — D638 Anemia in other chronic diseases classified elsewhere: Secondary | ICD-10-CM | POA: Diagnosis not present

## 2022-10-11 DIAGNOSIS — E875 Hyperkalemia: Secondary | ICD-10-CM | POA: Diagnosis not present

## 2022-10-16 ENCOUNTER — Other Ambulatory Visit: Payer: 59

## 2022-10-20 ENCOUNTER — Other Ambulatory Visit: Payer: 59

## 2022-10-24 DIAGNOSIS — N1832 Chronic kidney disease, stage 3b: Secondary | ICD-10-CM | POA: Diagnosis not present

## 2022-10-24 DIAGNOSIS — I10 Essential (primary) hypertension: Secondary | ICD-10-CM | POA: Diagnosis not present

## 2022-10-25 DIAGNOSIS — M25774 Osteophyte, right foot: Secondary | ICD-10-CM | POA: Diagnosis not present

## 2022-10-25 DIAGNOSIS — I739 Peripheral vascular disease, unspecified: Secondary | ICD-10-CM | POA: Diagnosis not present

## 2022-10-25 DIAGNOSIS — M79675 Pain in left toe(s): Secondary | ICD-10-CM | POA: Diagnosis not present

## 2022-10-25 DIAGNOSIS — L110 Acquired keratosis follicularis: Secondary | ICD-10-CM | POA: Diagnosis not present

## 2022-10-25 DIAGNOSIS — M79674 Pain in right toe(s): Secondary | ICD-10-CM | POA: Diagnosis not present

## 2022-10-25 DIAGNOSIS — M79671 Pain in right foot: Secondary | ICD-10-CM | POA: Diagnosis not present

## 2022-10-25 DIAGNOSIS — M79672 Pain in left foot: Secondary | ICD-10-CM | POA: Diagnosis not present

## 2022-10-29 ENCOUNTER — Ambulatory Visit
Admission: RE | Admit: 2022-10-29 | Discharge: 2022-10-29 | Disposition: A | Discharge status: Home / Self Care | Payer: 59 | Source: Ambulatory Visit | Attending: Interventional Radiology | Admitting: Interventional Radiology

## 2022-10-29 DIAGNOSIS — C220 Liver cell carcinoma: Secondary | ICD-10-CM

## 2022-10-29 MED ORDER — GADOPICLENOL 0.5 MMOL/ML IV SOLN
7.5000 mL | Freq: Once | INTRAVENOUS | Status: AC | PRN
Start: 2022-10-29 — End: 2022-10-29
  Administered 2022-10-29: 7.5 mL via INTRAVENOUS

## 2022-11-04 ENCOUNTER — Ambulatory Visit
Admission: RE | Admit: 2022-11-04 | Discharge: 2022-11-04 | Disposition: A | Discharge status: Home / Self Care | Payer: 59 | Source: Ambulatory Visit | Attending: Interventional Radiology | Admitting: Interventional Radiology

## 2022-11-04 DIAGNOSIS — C220 Liver cell carcinoma: Secondary | ICD-10-CM | POA: Diagnosis not present

## 2022-11-04 NOTE — Progress Notes (Signed)
Patient ID: Steven [name], male   DOB: [birth year], 72 y.o.   MRN: [id number]        Chief Complaint: Hepatocellular carcinoma, post bland embolization and microwave ablation on 08/18/2021.   Referring Physician(s): Drazek,Dawn   History of Present Illness: Steven [name] is a 72 y.o. male with past medical history significant for hepatitis C (treated with Harvoni in 2018) and alcoholism who is post combined bland hepatic embolization and image guided microwave ablation on 08/18/2021 for worrisome lesion within the dome of the anterior segment of the right lobe of the liver which demonstrates imaging characteristics compatible with a LI-RADS 5 lesion given provided history of hepatitis C and alcoholism.  The patient is seen today in telemedicine consultation following acquisition of surveillance MRI performed 10/29/2022.   The patient is once again without complaint.  He has returned to all preprocedural energy and activity levels.  No unintentional weight loss or weight gain.  No yellowing of the skin or eyes.  No increased abdominal girth.  No bloody or melanotic stools.   Patient reports continued abstinence from alcohol and has quit smoking nearly 1 year ago.  Past Medical History:  Diagnosis Date   Cancer (HCC)    skin adn liver   EtOH dependence (HCC)    Hepatitis C    successfully eradicated with Harvoni in 2018 with SVR documented   Hypertension    Shoulder impingement 2004    Past Surgical History:  Procedure Laterality Date   COLONOSCOPY WITH PROPOFOL N/A 05/03/2021   Procedure: COLONOSCOPY WITH PROPOFOL;  Surgeon: Lanelle Bal, DO;  Location: [facility];  Service: Endoscopy;  Laterality: N/A;  7:30 / ASA III   FRACTURE SURGERY Left    wrist   IR 3D INDEPENDENT WKST  08/18/2021   IR ANGIOGRAM SELECTIVE EACH ADDITIONAL VESSEL  08/18/2021   IR ANGIOGRAM SELECTIVE EACH ADDITIONAL VESSEL  08/18/2021   IR ANGIOGRAM VISCERAL SELECTIVE  08/18/2021   IR EMBO TUMOR ORGAN  ISCHEMIA INFARCT INC GUIDE ROADMAPPING  08/18/2021   IR RADIOLOGIST EVAL & MGMT  05/18/2021   IR RADIOLOGIST EVAL & MGMT  09/20/2021   IR RADIOLOGIST EVAL & MGMT  11/12/2021   IR RADIOLOGIST EVAL & MGMT  03/22/2022   IR US GUIDE TISSUE ABLATION  08/18/2021   IR US GUIDE VASC ACCESS LEFT  08/18/2021   POLYPECTOMY  05/03/2021   Procedure: POLYPECTOMY;  Surgeon: Lanelle Bal, DO;  Location: [facility];  Service: Endoscopy;;  ascending,descending   RADIOFREQUENCY ABLATION N/A 08/18/2021   Procedure: MICROWAVE  ABLATION;  Surgeon: Simonne Come, MD;  Location: [facility];  Service: Anesthesiology;  Laterality: N/A;   SHOULDER ACROMIOPLASTY Right 11/20/2012   Procedure: SHOULDER ACROMIOPLASTY;  Surgeon: Darreld Mclean, MD;  Location: [facility];  Service: Orthopedics;  Laterality: Right;   SKIN CANCER EXCISION Left 2013   wrist surgery fell off a roof   STOMACH SURGERY     had bleeding ulcers repaired. age 20   TOTAL KNEE ARTHROPLASTY Right 11/04/2015   Procedure: RIGHT TOTAL KNEE ARTHROPLASTY;  Surgeon: Vickki Hearing, MD;  Location: [facility];  Service: Orthopedics;  Laterality: Right;    Allergies: Patient has no known allergies.  Medications: Prior to Admission medications   Medication Sig Start Date End Date Taking? Authorizing Provider  aspirin-acetaminophen-caffeine (EXCEDRIN MIGRAINE) [phone] MG tablet Take 1 tablet by mouth every 6 (six) hours as needed for headache or migraine.    [provider]  buPROPion (WELLBUTRIN XL) 300  MG 24 hr tablet Take 300 mg by mouth every morning. 09/18/20   [provider]  cholecalciferol (VITAMIN D3) 25 MCG (1000 UNIT) tablet Take 1,000 Units by mouth daily.    [provider]  cloNIDine (CATAPRES) 0.1 MG tablet Take 0.1 mg by mouth at bedtime. 07/31/16   [provider]  esomeprazole (NEXIUM) 20 MG capsule Take 20 mg by mouth daily as needed (acid reflux).    [provider]  furosemide (LASIX) 20 MG tablet Take  10 mg by mouth 2 (two) times daily. 08/04/20   [provider]  gabapentin (NEURONTIN) 600 MG tablet Take 600 mg by mouth 3 (three) times daily. 09/18/20   [provider]  lisinopril (ZESTRIL) 5 MG tablet Take 5 mg by mouth daily. 08/04/20   [provider]  SUBOXONE 8-2 MG FILM Take 1 Film by mouth daily. 07/25/16   [provider]  triamcinolone cream (KENALOG) 0.1 % Apply 1 application topically daily as needed (rash/itching).    [provider]  VELTASSA 8.4 g packet Take 8.4 g by mouth 2 (two) times a week. Pt states at preop he takes it 2 times per week not on a regular schedule. 03/29/21   [provider]     Family History  Problem Relation Age of Onset   Liver disease Neg Hx    Colon cancer Neg Hx     Social History   Socioeconomic History   Marital status: Divorced    Spouse name: Not on file   Number of children: Not on file   Years of education: Not on file   Highest education level: Not on file  Occupational History   Not on file  Tobacco Use   Smoking status: Every Day    Packs/day: 1.00    Years: 49.00    Additional pack years: 0.00    Total pack years: 49.00    Types: Cigars, Cigarettes   Smokeless tobacco: Never  Vaping Use   Vaping Use: Never used  Substance and Sexual Activity   Alcohol use: Not Currently    Comment: 09/22/20 stopped drinking beer   Drug use: Yes    Types: Marijuana    Comment: every 3-4 months   Sexual activity: Yes  Other Topics Concern   Not on file  Social History Narrative   Not on file   Social Determinants of Health   Financial Resource Strain: Not on file  Food Insecurity: Not on file  Transportation Needs: Not on file  Physical Activity: Not on file  Stress: Not on file  Social Connections: Not on file    ECOG Status: 1 - Symptomatic but completely ambulatory  Review of Systems: A 12 point ROS discussed and pertinent positives are indicated in the HPI above.  All  other systems are negative.  Review of Systems  Vital Signs: There were no vitals taken for this visit.   Physical Exam  Mallampati Score:  Imaging:  Following examinations were reviewed:  Abdominal MRI - 10/29/2022; 06/18/2022; 03/15/2022; 11/06/2021; 11/28/2020 CT abdomen and pelvis - 06/24/2021; 08/31/2021  Intraprocedural images during bland embolization and fluoroscopic guided microwave ablation - 08/18/2021   Personal review of most recent surveillance abdominal MRI performed 10/29/2022 is without definitive evidence of residual or locally recurrent disease.  There are no new areas of abnormal enhancement to suggest development of multifocal hepatocellular carcinoma.  Labs:  CBC: No results for input(s): "WBC", "HGB", "HCT", "PLT" in the last 8760 hours.  COAGS:  No results for input(s): "INR", "APTT" in the last 8760 hours.  BMP: Recent Labs    06/13/22 1322  NA 142  K 4.6  CL 104  CO2 27  GLUCOSE 138*  BUN 27*  CALCIUM 9.4  CREATININE 1.55*    LIVER FUNCTION TESTS: Recent Labs    06/13/22 1322  BILITOT 0.4  AST 14  ALT 13  PROT 7.3    TUMOR MARKERS: Recent Labs    06/13/22 1322  AFPTM 2.4    Assessment and Plan:  Steven [name] is a 72 y.o. male with past medical history significant for hepatitis C (treated with Harvoni in 2018) and alcoholism who is post combined bland hepatic embolization and image guided microwave ablation on 08/18/2021 for worrisome lesion within the dome of the anterior segment of the right lobe of the liver which demonstrates imaging characteristics compatible with a LI-RADS 5 lesion given provided history of hepatitis C and alcoholism.  The patient is seen today in telemedicine consultation following acquisition of surveillance MRI performed 10/29/2022.   The patient is once again without complaint.   The following examinations were reviewed:  Abdominal MRI - 10/29/2022; 06/18/2022; 03/15/2022; 11/06/2021; 11/28/2020 CT abdomen  and pelvis - 06/24/2021; 08/31/2021  Intraprocedural images during bland embolization and fluoroscopic guided microwave ablation - 08/18/2021   Personal review of most recent surveillance abdominal MRI performed 10/29/2022 is without definitive evidence of residual or locally recurrent disease.  There are no new areas of abnormal enhancement to suggest development of multifocal hepatocellular carcinoma.  Note, as the patient's LFTs were normal and his AFP was NOT elevated PRIOR to the procedure and remain low, we will NOT routinely obtain these labs going forward and will use AFP solely for problem solving purposes.  Given stability for greater than the past year, we will now obtain surveillance imaging on a 60-month basis.  I once again explained to the patient that surveillance will be performed not only to evaluate the treated ablation/embolization site but also to ensure no new areas of hepatocellular carcinoma develop in the interval.  The patient demonstrated excellent understanding of the above conversation and is agreement with the proposed plan of care.  The patient knows to call the interventional radiology clinic with any interval questions or concerns.   Plan: Follow-up telemedicine consultation in 6 months (November/December 2024) following acquisition of surveillance abdominal MRI.  A copy of this report was sent to the requesting provider on this date.  Electronically Signed: Simonne Come 11/04/2022, 8:10 AM   I spent a total of 10 Minutes in face to face in clinical consultation, greater than 50% of which was counseling/coordinating care for post [facility] embolization/ablation.

## 2022-11-16 DIAGNOSIS — C220 Liver cell carcinoma: Secondary | ICD-10-CM | POA: Diagnosis not present

## 2022-11-16 DIAGNOSIS — K7469 Other cirrhosis of liver: Secondary | ICD-10-CM | POA: Diagnosis not present

## 2022-11-24 DIAGNOSIS — I10 Essential (primary) hypertension: Secondary | ICD-10-CM | POA: Diagnosis not present

## 2022-11-24 DIAGNOSIS — N1832 Chronic kidney disease, stage 3b: Secondary | ICD-10-CM | POA: Diagnosis not present

## 2022-12-24 DIAGNOSIS — I10 Essential (primary) hypertension: Secondary | ICD-10-CM | POA: Diagnosis not present

## 2022-12-24 DIAGNOSIS — N1832 Chronic kidney disease, stage 3b: Secondary | ICD-10-CM | POA: Diagnosis not present

## 2023-01-13 DIAGNOSIS — N1832 Chronic kidney disease, stage 3b: Secondary | ICD-10-CM | POA: Diagnosis not present

## 2023-01-13 DIAGNOSIS — G8929 Other chronic pain: Secondary | ICD-10-CM | POA: Diagnosis not present

## 2023-01-13 DIAGNOSIS — J439 Emphysema, unspecified: Secondary | ICD-10-CM | POA: Diagnosis not present

## 2023-01-13 DIAGNOSIS — I10 Essential (primary) hypertension: Secondary | ICD-10-CM | POA: Diagnosis not present

## 2023-01-26 DIAGNOSIS — M25511 Pain in right shoulder: Secondary | ICD-10-CM | POA: Diagnosis not present

## 2023-01-30 DIAGNOSIS — N2581 Secondary hyperparathyroidism of renal origin: Secondary | ICD-10-CM | POA: Diagnosis not present

## 2023-01-30 DIAGNOSIS — E875 Hyperkalemia: Secondary | ICD-10-CM | POA: Diagnosis not present

## 2023-01-30 DIAGNOSIS — N1832 Chronic kidney disease, stage 3b: Secondary | ICD-10-CM | POA: Diagnosis not present

## 2023-01-30 DIAGNOSIS — D638 Anemia in other chronic diseases classified elsewhere: Secondary | ICD-10-CM | POA: Diagnosis not present

## 2023-01-31 DIAGNOSIS — L110 Acquired keratosis follicularis: Secondary | ICD-10-CM | POA: Diagnosis not present

## 2023-01-31 DIAGNOSIS — M79671 Pain in right foot: Secondary | ICD-10-CM | POA: Diagnosis not present

## 2023-01-31 DIAGNOSIS — L565 Disseminated superficial actinic porokeratosis (DSAP): Secondary | ICD-10-CM | POA: Diagnosis not present

## 2023-01-31 DIAGNOSIS — M79674 Pain in right toe(s): Secondary | ICD-10-CM | POA: Diagnosis not present

## 2023-02-10 ENCOUNTER — Encounter: Payer: Self-pay | Admitting: Otolaryngology

## 2023-02-10 DIAGNOSIS — E875 Hyperkalemia: Secondary | ICD-10-CM | POA: Diagnosis not present

## 2023-02-10 DIAGNOSIS — N2581 Secondary hyperparathyroidism of renal origin: Secondary | ICD-10-CM | POA: Diagnosis not present

## 2023-02-10 DIAGNOSIS — N1831 Chronic kidney disease, stage 3a: Secondary | ICD-10-CM | POA: Diagnosis not present

## 2023-02-10 DIAGNOSIS — E559 Vitamin D deficiency, unspecified: Secondary | ICD-10-CM | POA: Diagnosis not present

## 2023-02-13 DIAGNOSIS — N1832 Chronic kidney disease, stage 3b: Secondary | ICD-10-CM | POA: Diagnosis not present

## 2023-02-13 DIAGNOSIS — I10 Essential (primary) hypertension: Secondary | ICD-10-CM | POA: Diagnosis not present

## 2023-02-14 ENCOUNTER — Other Ambulatory Visit: Payer: Self-pay | Admitting: Otolaryngology

## 2023-02-14 DIAGNOSIS — M25511 Pain in right shoulder: Secondary | ICD-10-CM

## 2023-02-16 ENCOUNTER — Ambulatory Visit
Admission: RE | Admit: 2023-02-16 | Discharge: 2023-02-16 | Disposition: A | Discharge status: Home / Self Care | Payer: 59 | Source: Ambulatory Visit | Attending: Otolaryngology | Admitting: Otolaryngology

## 2023-02-16 DIAGNOSIS — M25511 Pain in right shoulder: Secondary | ICD-10-CM

## 2023-02-19 ENCOUNTER — Ambulatory Visit
Admission: RE | Admit: 2023-02-19 | Discharge: 2023-02-19 | Disposition: A | Discharge status: Home / Self Care | Payer: 59 | Source: Ambulatory Visit | Attending: Otolaryngology | Admitting: Otolaryngology

## 2023-02-19 DIAGNOSIS — M25511 Pain in right shoulder: Secondary | ICD-10-CM

## 2023-02-19 DIAGNOSIS — M62511 Muscle wasting and atrophy, not elsewhere classified, right shoulder: Secondary | ICD-10-CM | POA: Diagnosis not present

## 2023-02-19 DIAGNOSIS — M19011 Primary osteoarthritis, right shoulder: Secondary | ICD-10-CM | POA: Diagnosis not present

## 2023-03-05 ENCOUNTER — Other Ambulatory Visit: Payer: 59

## 2023-03-07 ENCOUNTER — Other Ambulatory Visit: Payer: Self-pay | Admitting: Orthopedic Surgery

## 2023-03-07 DIAGNOSIS — M542 Cervicalgia: Secondary | ICD-10-CM

## 2023-03-07 DIAGNOSIS — M25511 Pain in right shoulder: Secondary | ICD-10-CM | POA: Diagnosis not present

## 2023-03-15 DIAGNOSIS — N1832 Chronic kidney disease, stage 3b: Secondary | ICD-10-CM | POA: Diagnosis not present

## 2023-03-15 DIAGNOSIS — I10 Essential (primary) hypertension: Secondary | ICD-10-CM | POA: Diagnosis not present

## 2023-03-15 IMAGING — US US ABDOMEN LIMITED
1 series · 15 of 25 positions shown · non-contrast
Comparison: Noncontrast CT Chest, Abdomen, and Pelvis 08/31/2021.
Abdomen MRI 04/24/2021.

CLINICAL DATA: 70-year-old male with hepatic insufficiency. Sepsis.
Status post liver ablation earlier this month for hepatocellular
carcinoma in the right lobe.

EXAM:
ULTRASOUND ABDOMEN LIMITED RIGHT UPPER QUADRANT

[Series 1: us abdomen limited ruq mc & wl · 15 of 63 slices shown]
[im 1/63]
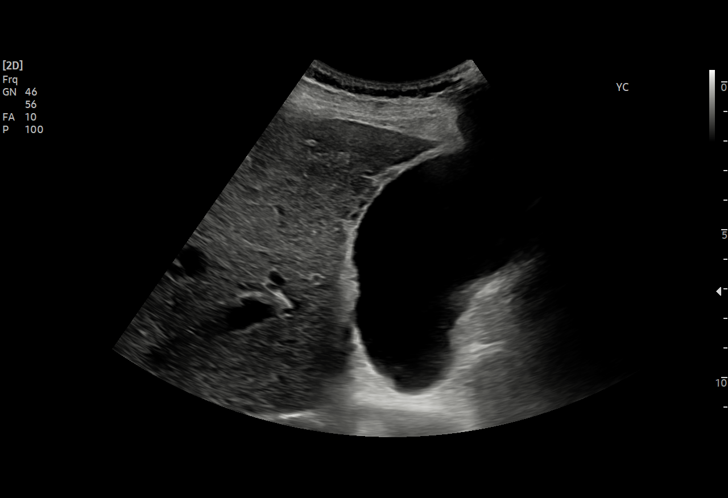
[im 6/63]
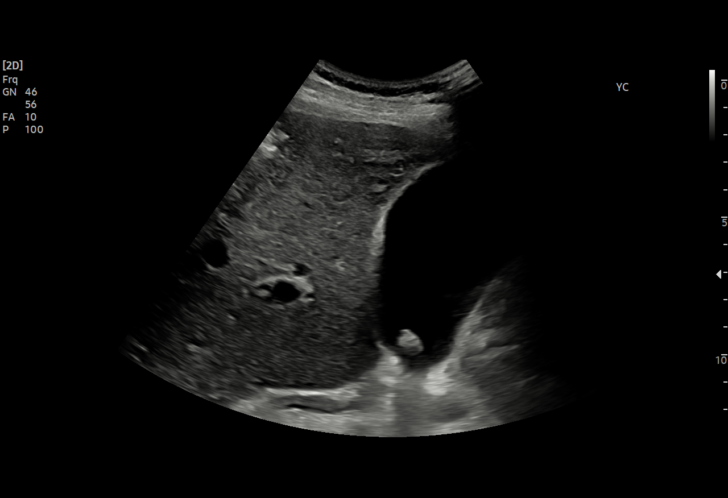
[im 11/63]
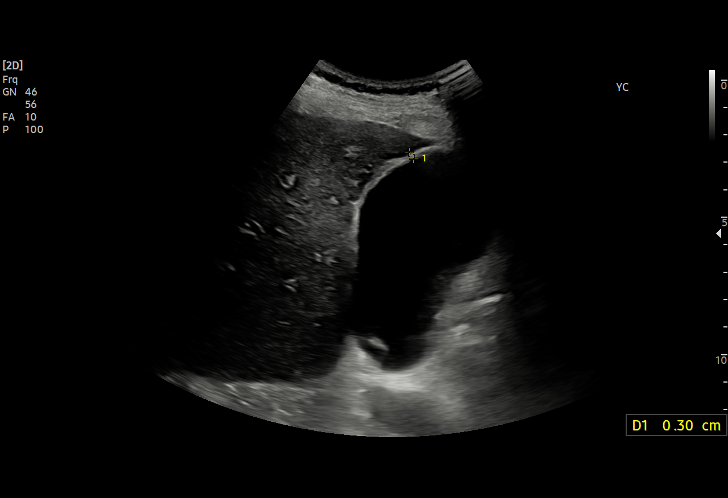
[im 13/63]
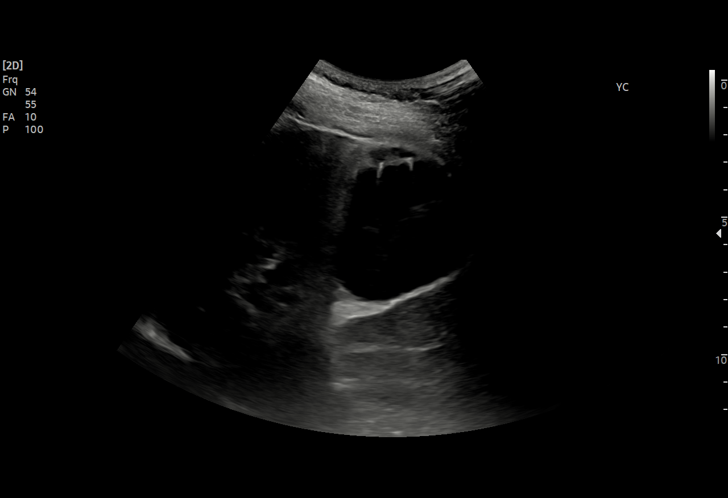
[im 19/63]
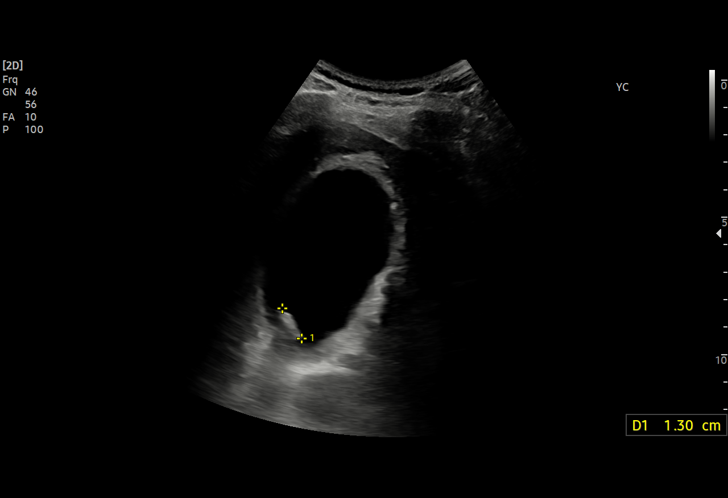
[im 24/63]
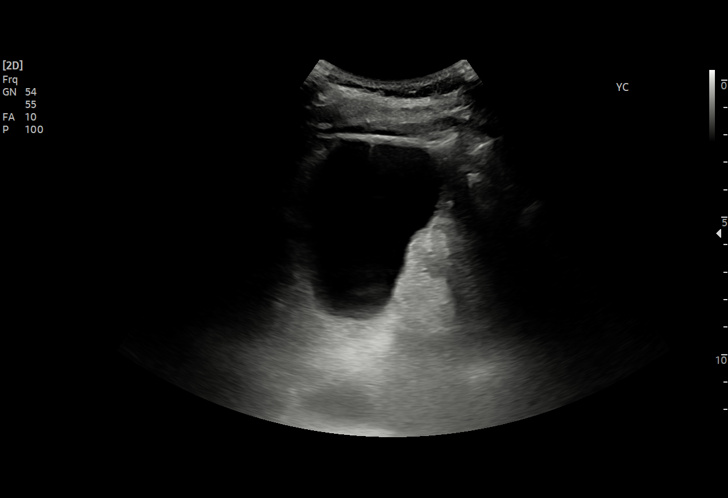
[im 26/63]
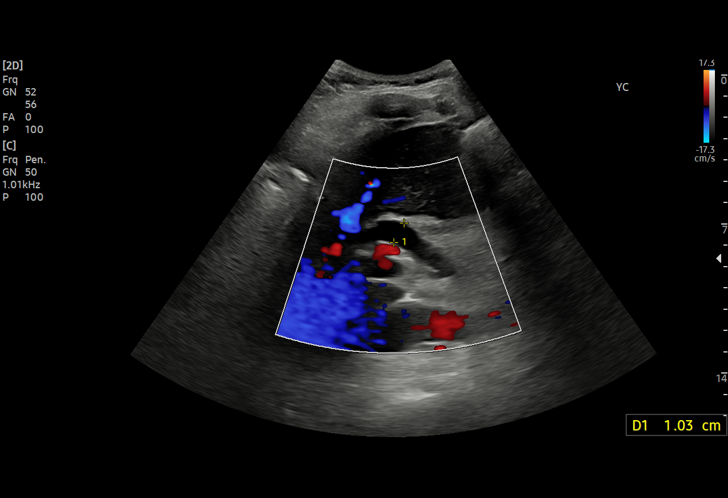
[im 32/63]
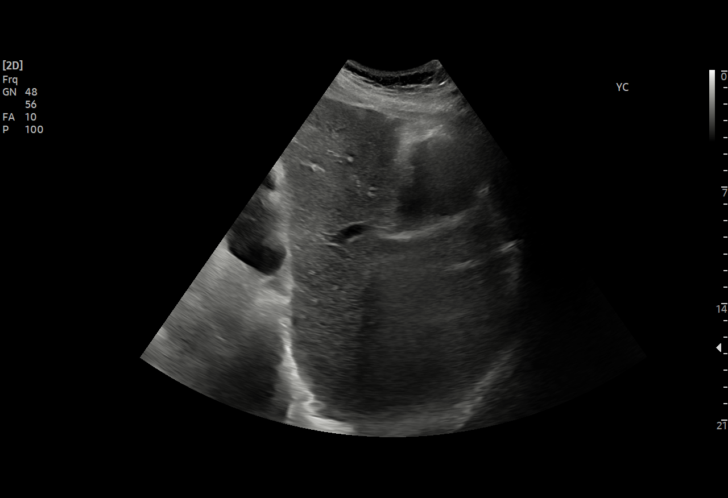
[im 37/63]
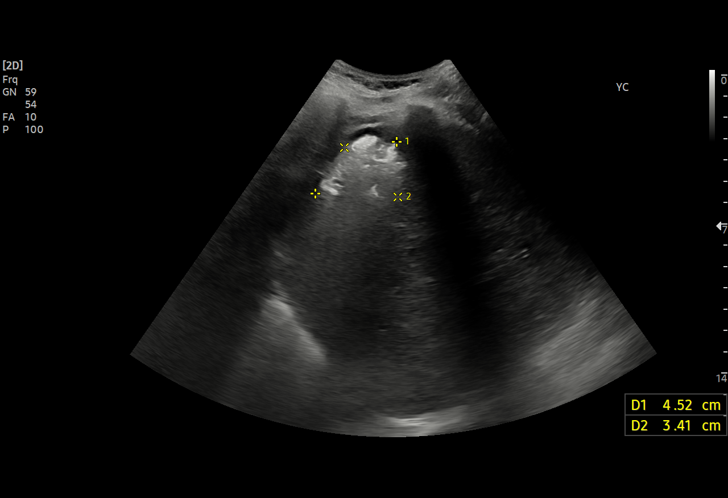
[im 39/63]
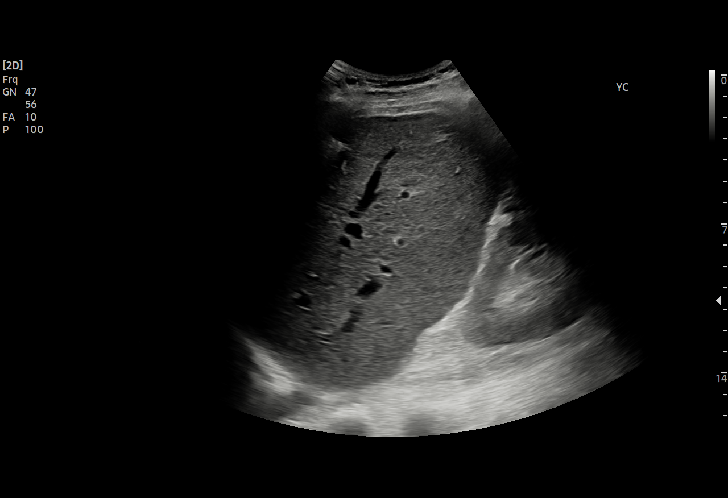
[im 44/63]
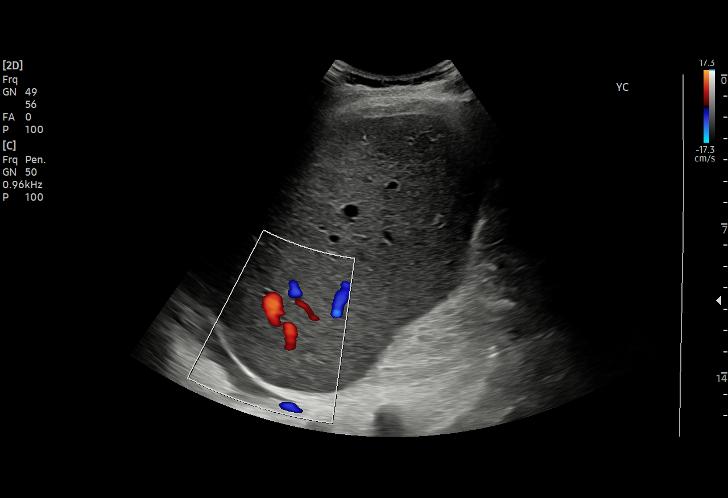
[im 50/63]
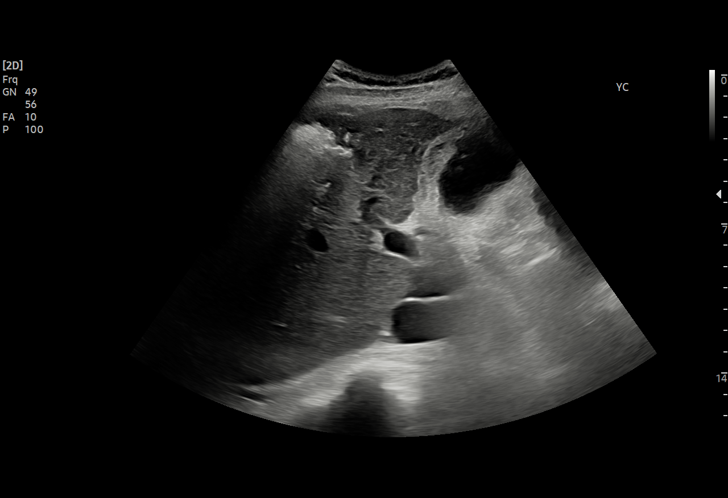
[im 52/63]
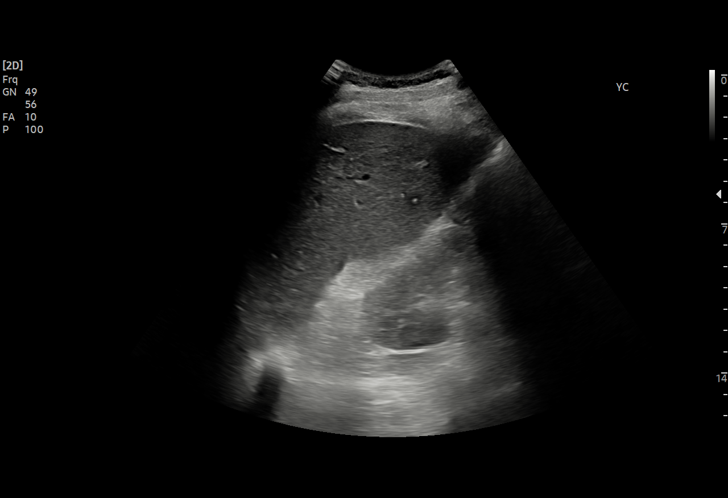
[im 57/63]
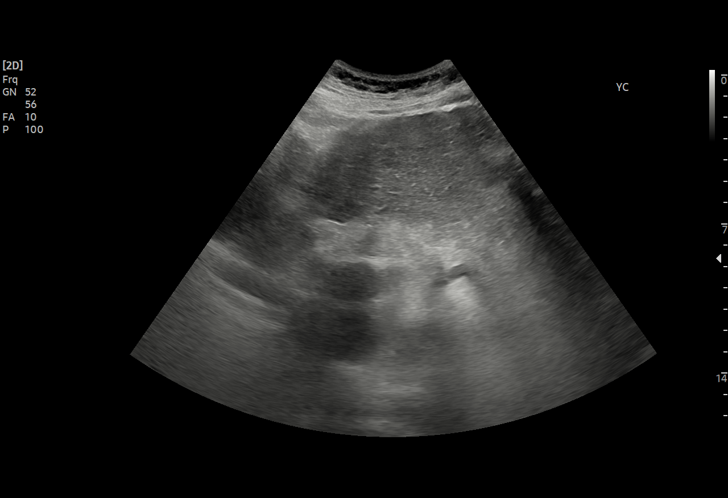
[im 63/63]
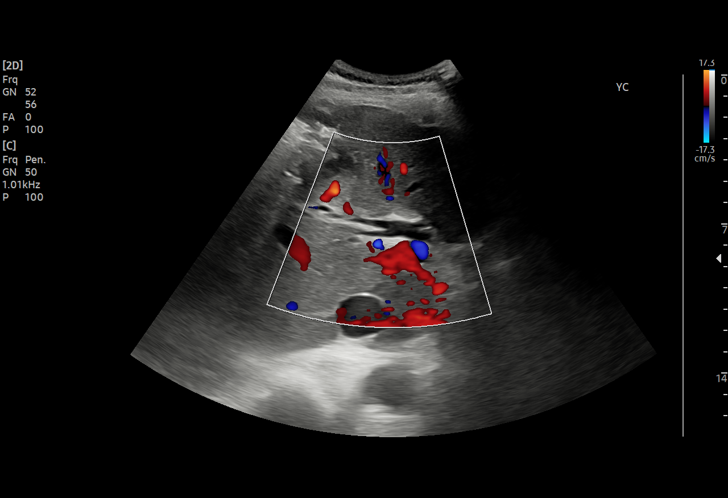

[15 of 25 positions shown; findings below may reference images not displayed]

FINDINGS: Gallbladder:

Gallbladder wall edema versus small volume pericholecystic fluid
(image 5). But apart from that area of the gallbladder fossa wall
thickness appears largely normal. There is comet tail artifact from
the gallbladder wall (image 14) in keeping with adenomyomatosis, and
the gallbladder fundus was heterogeneous on MRI last year. 13 mm
gallstones. No sonographic Murphy sign elicited.

Common bile duct:

Diameter: 10 mm, dilated although up to 8 mm by MRI last year. No
filling defect in the visible duct.

Liver:

Echogenic shadowing region in the treated right hepatic lobe
corresponding to the CT finding yesterday. No regional
hypervascularity (image 39). No drainable fluid is evident by
ultrasound.

No other discrete liver lesion identified. No intrahepatic biliary
ductal dilatation identified. Portal vein is patent on color Doppler
imaging with normal direction of blood flow towards the liver.

Other: Negative visible right kidney. No free fluid in the right
upper quadrant.
IMPRESSION: 1. Echogenic, gas shadowing area in the right hepatic lobe
corresponding to the CT finding yesterday. No hypervascularity or
drainable fluid is evident by Doppler ultrasound.
2. No free fluid.
3. Abnormal gallbladder and CBD although without strong evidence of
acute cholecystitis or acute biliary ductal obstruction. Gallstones
and adenomyomatosis.

## 2023-03-17 IMAGING — NM NM HEPATO W/GB/PHARM/[PERSON_NAME]
2 series · 12 of 12 positions shown · non-contrast
Comparison: Ultrasound September 01, 2021.

CLINICAL DATA: Upper abdominal pain.

EXAM:
NUCLEAR MEDICINE HEPATOBILIARY IMAGING WITH GALLBLADDER EF
TECHNIQUE: Sequential images of the abdomen were obtained [DATE] minutes
following intravenous administration of radiopharmaceutical. After
oral ingestion of Ensure, gallbladder ejection fraction was
determined. At 60 min, normal ejection fraction is greater than 33%.
RADIOPHARMACEUTICALS:  5.4 mCi Yc-TTm  Choletec IV

[Series 1: post ensure · 3.28mm/px · 6 of 60 frames shown]
[frame 6/60]
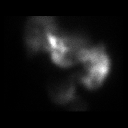
[frame 16/60]
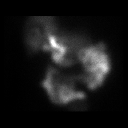
[frame 26/60]
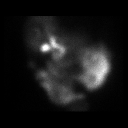
[frame 36/60]
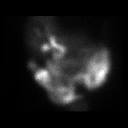
[frame 46/60]
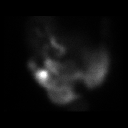
[frame 56/60]
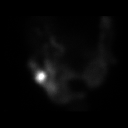

[Series 1: hida scan hr1 · 3.28mm/px · 6 of 60 frames shown]
[frame 6/60]
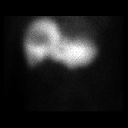
[frame 16/60]
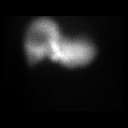
[frame 26/60]
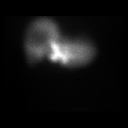
[frame 36/60]
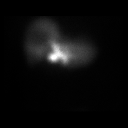
[frame 46/60]
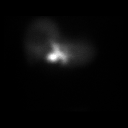
[frame 56/60]
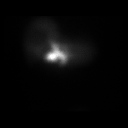

[12 of 12 positions shown; findings below may reference images not displayed]

FINDINGS: Prompt uptake and biliary excretion of activity by the liver is
seen. Gallbladder activity is visualized, consistent with patency of
cystic duct. Biliary activity passes into small bowel, consistent
with patent common bile duct.

Calculated gallbladder ejection fraction is 87%. Patient became
nauseous after Ensure administration. (Normal gallbladder ejection
fraction with Ensure is greater than 33%.)
IMPRESSION: Normal gallbladder ejection fraction is noted after Ensure
administration.

## 2023-03-19 ENCOUNTER — Ambulatory Visit
Admission: RE | Admit: 2023-03-19 | Discharge: 2023-03-19 | Disposition: A | Discharge status: Home / Self Care | Payer: 59 | Source: Ambulatory Visit | Attending: Orthopedic Surgery | Admitting: Orthopedic Surgery

## 2023-03-19 DIAGNOSIS — M4802 Spinal stenosis, cervical region: Secondary | ICD-10-CM | POA: Diagnosis not present

## 2023-03-19 DIAGNOSIS — M47812 Spondylosis without myelopathy or radiculopathy, cervical region: Secondary | ICD-10-CM | POA: Diagnosis not present

## 2023-03-19 DIAGNOSIS — M542 Cervicalgia: Secondary | ICD-10-CM

## 2023-03-26 ENCOUNTER — Other Ambulatory Visit: Payer: 59

## 2023-04-10 DIAGNOSIS — G540 Brachial plexus disorders: Secondary | ICD-10-CM | POA: Diagnosis not present

## 2023-04-10 DIAGNOSIS — M5412 Radiculopathy, cervical region: Secondary | ICD-10-CM | POA: Diagnosis not present

## 2023-04-14 DIAGNOSIS — M542 Cervicalgia: Secondary | ICD-10-CM | POA: Diagnosis not present

## 2023-04-15 DIAGNOSIS — I10 Essential (primary) hypertension: Secondary | ICD-10-CM | POA: Diagnosis not present

## 2023-04-15 DIAGNOSIS — N1832 Chronic kidney disease, stage 3b: Secondary | ICD-10-CM | POA: Diagnosis not present

## 2023-05-02 DIAGNOSIS — L110 Acquired keratosis follicularis: Secondary | ICD-10-CM | POA: Diagnosis not present

## 2023-05-02 DIAGNOSIS — M79672 Pain in left foot: Secondary | ICD-10-CM | POA: Diagnosis not present

## 2023-05-02 DIAGNOSIS — L565 Disseminated superficial actinic porokeratosis (DSAP): Secondary | ICD-10-CM | POA: Diagnosis not present

## 2023-05-02 DIAGNOSIS — M79675 Pain in left toe(s): Secondary | ICD-10-CM | POA: Diagnosis not present

## 2023-05-02 DIAGNOSIS — M2010 Hallux valgus (acquired), unspecified foot: Secondary | ICD-10-CM | POA: Diagnosis not present

## 2023-05-02 DIAGNOSIS — M79671 Pain in right foot: Secondary | ICD-10-CM | POA: Diagnosis not present

## 2023-05-02 DIAGNOSIS — M79674 Pain in right toe(s): Secondary | ICD-10-CM | POA: Diagnosis not present

## 2023-05-08 DIAGNOSIS — C220 Liver cell carcinoma: Secondary | ICD-10-CM | POA: Diagnosis not present

## 2023-05-08 DIAGNOSIS — K7469 Other cirrhosis of liver: Secondary | ICD-10-CM | POA: Diagnosis not present

## 2023-05-15 ENCOUNTER — Other Ambulatory Visit: Payer: Self-pay | Admitting: Interventional Radiology

## 2023-05-15 DIAGNOSIS — C220 Liver cell carcinoma: Secondary | ICD-10-CM

## 2023-05-15 DIAGNOSIS — I10 Essential (primary) hypertension: Secondary | ICD-10-CM | POA: Diagnosis not present

## 2023-05-15 DIAGNOSIS — N1832 Chronic kidney disease, stage 3b: Secondary | ICD-10-CM | POA: Diagnosis not present

## 2023-06-05 DIAGNOSIS — I129 Hypertensive chronic kidney disease with stage 1 through stage 4 chronic kidney disease, or unspecified chronic kidney disease: Secondary | ICD-10-CM | POA: Diagnosis not present

## 2023-06-05 DIAGNOSIS — N1831 Chronic kidney disease, stage 3a: Secondary | ICD-10-CM | POA: Diagnosis not present

## 2023-06-05 DIAGNOSIS — N2581 Secondary hyperparathyroidism of renal origin: Secondary | ICD-10-CM | POA: Diagnosis not present

## 2023-06-05 DIAGNOSIS — E875 Hyperkalemia: Secondary | ICD-10-CM | POA: Diagnosis not present

## 2023-06-05 DIAGNOSIS — N189 Chronic kidney disease, unspecified: Secondary | ICD-10-CM | POA: Diagnosis not present

## 2023-06-15 DIAGNOSIS — I10 Essential (primary) hypertension: Secondary | ICD-10-CM | POA: Diagnosis not present

## 2023-06-15 DIAGNOSIS — N1832 Chronic kidney disease, stage 3b: Secondary | ICD-10-CM | POA: Diagnosis not present

## 2023-06-21 ENCOUNTER — Telehealth: Payer: Self-pay

## 2023-06-21 MED ORDER — DIAZEPAM 10 MG PO TABS: 10.0000 mg | ORAL_TABLET | Freq: Once | ORAL | 0 refills | Status: AC

## 2023-06-21 NOTE — Telephone Encounter (Signed)
 Phone call to patient to review instructions for valium  for MRI on 07/01/23 at 11:40AM. Prescription called into [facility]. Pt aware and verbalized understanding of instructions. Prescription: Valium  10mg  Take 1 tablet by mouth 30 minutes prior to MRI. Do Not Drive!

## 2023-07-01 ENCOUNTER — Ambulatory Visit
Admission: RE | Admit: 2023-07-01 | Discharge: 2023-07-01 | Disposition: A | Discharge status: Home / Self Care | Payer: 59 | Source: Ambulatory Visit | Attending: Interventional Radiology | Admitting: Interventional Radiology

## 2023-07-01 DIAGNOSIS — C220 Liver cell carcinoma: Secondary | ICD-10-CM

## 2023-07-01 DIAGNOSIS — K7689 Other specified diseases of liver: Secondary | ICD-10-CM | POA: Diagnosis not present

## 2023-07-01 DIAGNOSIS — I7143 Infrarenal abdominal aortic aneurysm, without rupture: Secondary | ICD-10-CM | POA: Diagnosis not present

## 2023-07-01 DIAGNOSIS — K8020 Calculus of gallbladder without cholecystitis without obstruction: Secondary | ICD-10-CM | POA: Diagnosis not present

## 2023-07-01 MED ORDER — GADOPICLENOL 0.5 MMOL/ML IV SOLN
8.0000 mL | Freq: Once | INTRAVENOUS | Status: AC | PRN
  Administered 2023-07-01: 8 mL via INTRAVENOUS

## 2023-07-11 ENCOUNTER — Other Ambulatory Visit: Payer: Self-pay | Admitting: Interventional Radiology

## 2023-07-11 DIAGNOSIS — C220 Liver cell carcinoma: Secondary | ICD-10-CM

## 2023-07-14 ENCOUNTER — Ambulatory Visit
Admission: RE | Admit: 2023-07-14 | Discharge: 2023-07-14 | Disposition: A | Discharge status: Home / Self Care | Payer: 59 | Source: Ambulatory Visit | Attending: Interventional Radiology | Admitting: Interventional Radiology

## 2023-07-14 DIAGNOSIS — C220 Liver cell carcinoma: Secondary | ICD-10-CM

## 2023-07-14 NOTE — Progress Notes (Signed)
 Patient ID: Steven [name], male   DOB: 08-May-1951, 73 y.o.   MRN: [id number]        Chief Complaint: Hepatocellular carcinoma, post bland embolization and microwave ablation on 08/18/2021.   Referring Physician(s): Drazek,Dawn   History of Present Illness: Steven [name] is a 73 y.o. male with past medical history significant for hepatitis C (treated with Harvoni in 2018) and alcoholism who is post combined bland hepatic embolization and image guided microwave ablation on 08/18/2021 for worrisome lesion within the dome of the anterior segment of the right lobe of the liver which demonstrates imaging characteristics compatible with a LI-RADS 5 lesion given provided history of hepatitis C and alcoholism.  The patient is seen today in telemedicine consultation following acquisition of surveillance MRI performed 07/01/2023.   The patient is once again without complaint.  He has returned to all preprocedural energy and activity levels.  No unintentional weight loss or weight gain.  No yellowing of the skin or eyes.  No increased abdominal girth.  No bloody or melanotic stools.   Patient reports continued abstinence from alcohol and has quit smoking >1 year ago.  Past Medical History:  Diagnosis Date   Cancer (HCC)    skin adn liver   EtOH dependence (HCC)    Hepatitis C    successfully eradicated with Harvoni in 2018 with SVR documented   Hypertension    Shoulder impingement 2004    Past Surgical History:  Procedure Laterality Date   COLONOSCOPY WITH PROPOFOL  N/A 05/03/2021   Procedure: COLONOSCOPY WITH PROPOFOL ;  Surgeon: Cindie Carlin POUR, DO;  Location: [facility];  Service: Endoscopy;  Laterality: N/A;  7:30 / ASA III   FRACTURE SURGERY Left    wrist   IR 3D INDEPENDENT WKST  08/18/2021   IR ANGIOGRAM SELECTIVE EACH ADDITIONAL VESSEL  08/18/2021   IR ANGIOGRAM SELECTIVE EACH ADDITIONAL VESSEL  08/18/2021   IR ANGIOGRAM VISCERAL SELECTIVE  08/18/2021   IR EMBO TUMOR ORGAN  ISCHEMIA INFARCT INC GUIDE ROADMAPPING  08/18/2021   IR RADIOLOGIST EVAL & MGMT  05/18/2021   IR RADIOLOGIST EVAL & MGMT  09/20/2021   IR RADIOLOGIST EVAL & MGMT  11/12/2021   IR RADIOLOGIST EVAL & MGMT  03/22/2022   IR US  GUIDE TISSUE ABLATION  08/18/2021   IR US  GUIDE VASC ACCESS LEFT  08/18/2021   POLYPECTOMY  05/03/2021   Procedure: POLYPECTOMY;  Surgeon: Cindie Carlin POUR, DO;  Location: [facility];  Service: Endoscopy;;  ascending,descending   RADIOFREQUENCY ABLATION N/A 08/18/2021   Procedure: MICROWAVE  ABLATION;  Surgeon: Adele Rush, MD;  Location: [facility];  Service: Anesthesiology;  Laterality: N/A;   SHOULDER ACROMIOPLASTY Right 11/20/2012   Procedure: SHOULDER ACROMIOPLASTY;  Surgeon: Lemond Stable, MD;  Location: [facility];  Service: Orthopedics;  Laterality: Right;   SKIN CANCER EXCISION Left 2013   wrist surgery fell off a roof   STOMACH SURGERY     had bleeding ulcers repaired. age 73   TOTAL KNEE ARTHROPLASTY Right 11/04/2015   Procedure: RIGHT TOTAL KNEE ARTHROPLASTY;  Surgeon: Taft FORBES Minerva, MD;  Location: [facility];  Service: Orthopedics;  Laterality: Right;    Allergies: Patient has no known allergies.  Medications: Prior to Admission medications   Medication Sig Start Date End Date Taking? Authorizing Provider  aspirin -acetaminophen -caffeine (EXCEDRIN MIGRAINE) 250-250-65 MG tablet Take 1 tablet by mouth every 6 (six) hours as needed for headache or migraine.    [provider]  buPROPion  (WELLBUTRIN  XL) 300 MG  24 hr tablet Take 300 mg by mouth every morning. 09/18/20   [provider]  cholecalciferol  (VITAMIN D3) 25 MCG (1000 UNIT) tablet Take 1,000 Units by mouth daily.    [provider]  cloNIDine  (CATAPRES ) 0.1 MG tablet Take 0.1 mg by mouth at bedtime. 07/31/16   [provider]  esomeprazole (NEXIUM) 20 MG capsule Take 20 mg by mouth daily as needed (acid reflux).    [provider]  furosemide  (LASIX ) 20 MG tablet Take  10 mg by mouth 2 (two) times daily. 08/04/20   [provider]  gabapentin  (NEURONTIN ) 600 MG tablet Take 600 mg by mouth 3 (three) times daily. 09/18/20   [provider]  lisinopril  (ZESTRIL ) 5 MG tablet Take 5 mg by mouth daily. 08/04/20   [provider]  SUBOXONE  8-2 MG FILM Take 1 Film by mouth daily. 07/25/16   [provider]  triamcinolone cream (KENALOG) 0.1 % Apply 1 application topically daily as needed (rash/itching).    [provider]  VELTASSA 8.4 g packet Take 8.4 g by mouth 2 (two) times a week. Pt states at preop he takes it 2 times per week not on a regular schedule. 03/29/21   [provider]     Family History  Problem Relation Age of Onset   Liver disease Neg Hx    Colon cancer Neg Hx     Social History   Socioeconomic History   Marital status: Divorced    Spouse name: Not on file   Number of children: Not on file   Years of education: Not on file   Highest education level: Not on file  Occupational History   Not on file  Tobacco Use   Smoking status: Every Day    Current packs/day: 1.00    Average packs/day: 1 pack/day for 49.0 years (49.0 ttl pk-yrs)    Types: Cigars, Cigarettes   Smokeless tobacco: Never  Vaping Use   Vaping status: Never Used  Substance and Sexual Activity   Alcohol use: Not Currently    Comment: 09/22/20 stopped drinking beer   Drug use: Yes    Types: Marijuana    Comment: every 3-4 months   Sexual activity: Yes  Other Topics Concern   Not on file  Social History Narrative   Not on file   Social Drivers of Health   Financial Resource Strain: Not on file  Food Insecurity: Low Risk  (04/22/2022)   [facility]   Hunger Vital Sign    Worried About Running Out of Food in the Last Year: Never true    Ran Out of Food in the Last Year: Never true  Transportation Needs: Not on file (04/22/2022)  Physical Activity: Not on file  Stress: Not on file   Social Connections: Not on file    ECOG Status: 1 - Symptomatic but completely ambulatory  Review of Systems: A 12 point ROS discussed and pertinent positives are indicated in the HPI above.  All other systems are negative.  Review of Systems  Vital Signs: There were no vitals taken for this visit.   Physical Exam  Imaging:  Following examinations were reviewed:  Abdominal MRI - 07/01/2023; 10/29/2022; 06/18/2022; 03/15/2022; 11/06/2021; 11/28/2020 CT abdomen and pelvis - 06/24/2021; 08/31/2021  Intraprocedural images during bland embolization and fluoroscopic guided microwave ablation - 08/18/2021   Personal review of most recent surveillance abdominal MRI performed 07/01/2023 is without definitive evidence of residual or locally recurrent disease.  There are no new areas of abnormal enhancement to suggest development of multifocal hepatocellular carcinoma.  MR ABDOMEN WWO CONTRAST Result Date: 07/09/2023 CLINICAL DATA:  Follow-up hepatocellular carcinoma. Status post microwave ablation and bland embolization 2 lesion in right lobe of liver. EXAM: MRI ABDOMEN WITHOUT AND WITH CONTRAST TECHNIQUE: Multiplanar multisequence MR imaging of the abdomen was performed both before and after the administration of intravenous contrast. CONTRAST:  8 cc Vueway  COMPARISON:  10/29/2018 for FINDINGS: Lower chest: No acute findings. Hepatobiliary: Treated lesion in the anterior right lobe of liver, segment 8 measures 1.9 x 1.3 cm, image 29/13. There is no convincing evidence for recurrent arterial phase enhancement within the treated lesion to suggest recurrent disease. No new enhancing liver lesions identified. Anterior to the treated portions of the liver this area there is a small extrahepatic soft tissue density nodule which shows arterial phase enhancement measuring 1.4 x 0.6 cm, image 28/12. Previously this measured the same. This is stable when compared with 06/18/2022 and likely reflects post treatment  change. Tumor seeding along the ablation tract is considered less favored. Stone within the dependent portion of the gallbladder measures 1.5 cm, image 23/7. Adenomyomatosis within the gallbladder fundus. Stable appearance of common bile duct measuring 0.9 cm in diameter, image 24/7. No signs of choledocholithiasis. Pancreas: No mass, inflammatory changes, or other parenchymal abnormality identified. Spleen:  Within normal limits in size and appearance. Adrenals/Urinary Tract: Normal adrenal glands. No suspicious kidney mass or hydronephrosis. Hemorrhagic cyst arises off the upper pole of left kidney measuring 8 mm. Small bilateral T2 hyperintense kidney cysts are also noted. No signs of obstructive uropathy. Stomach/Bowel: Visualized portions within the abdomen are unremarkable. Vascular/Lymphatic: Stable 3.4 cm infrarenal abdominal aortic aneurysm. No adenopathy. Other:  No ascites or focal fluid collections identified. Musculoskeletal: No suspicious bone lesions identified. IMPRESSION: 1. Stable appearance of treated lesion in the anterior right lobe of liver, segment 8. No convincing evidence for recurrent disease. Continued interval surveillance is advised. 2. Stable appearance of small extrahepatic soft tissue density nodule anterior to the treated portions of the liver. This is stable when compared with 06/18/2022 and likely reflects post treatment change. Tumor seeding along the ablation tract is considered less favored. 3. Cholelithiasis. 4. Stable 3.4 cm infrarenal abdominal aortic aneurysm. Recommend follow-up ultrasound every 3 years. (Ref.: J Vasc Surg. 2018; 67:2-77 and J Am Coll Radiol 2013;10(10):789-794.) Electronically Signed   By: Waddell Calk M.D.   On: 07/09/2023 16:05    Labs:  CBC: No results for input(s): WBC, HGB, HCT, PLT in the last 8760 hours.  COAGS: No results for input(s): INR, APTT in the last 8760 hours.  BMP: No results for input(s): NA, K, CL, CO2,  GLUCOSE, BUN, CALCIUM, CREATININE, GFRNONAA, GFRAA in the last 8760 hours.  Invalid input(s): CMP  LIVER FUNCTION TESTS: No results for input(s): BILITOT, AST, ALT, ALKPHOS, PROT, ALBUMIN in the last 8760 hours.  TUMOR MARKERS: No results for input(s): AFPTM, CEA, CA199, CHROMGRNA in the last 8760 hours.  Assessment and Plan:  Steven [name] is a 73 y.o. male with past medical history significant for hepatitis C (treated with Harvoni in 2018) and alcoholism who is post combined bland hepatic embolization and image guided microwave ablation on 08/18/2021 for worrisome lesion within the dome of the anterior segment of the right lobe of the liver which demonstrates imaging characteristics compatible with a LI-RADS 5 lesion given provided history of hepatitis C and alcoholism.  The patient is seen today in telemedicine  consultation following acquisition of surveillance MRI performed 07/01/2023.   The patient is once again without complaint.   Following examinations were reviewed:  Abdominal MRI - 07/01/2023; 10/29/2022; 06/18/2022; 03/15/2022; 11/06/2021; 11/28/2020 CT abdomen and pelvis - 06/24/2021; 08/31/2021  Intraprocedural images during bland embolization and fluoroscopic guided microwave ablation - 08/18/2021   Personal review of most recent surveillance abdominal MRI performed 07/01/2023 is without definitive evidence of residual or locally recurrent disease.  There are no new areas of abnormal enhancement to suggest development of multifocal hepatocellular carcinoma.  Note, as the patient's LFTs were normal and his AFP was NOT elevated PRIOR to the procedure and remain low, we will NOT routinely obtain these labs going forward and will use AFP solely for problem solving purposes.   Given stability for greater than the past year, we will now obtain surveillance imaging on a 34-month basis.  I once again explained to the patient that surveillance will be performed  not only to evaluate the treated ablation/embolization site but also to ensure no new areas of hepatocellular carcinoma develop in the interval.   The patient demonstrated excellent understanding of the above conversation and is agreement with the proposed plan of care.  The patient knows to call the interventional radiology clinic with any interval questions or concerns.   Plan: Follow-up telemedicine consultation in 6 months (July/August 2025) following acquisition of surveillance abdominal MRI.   A copy of this report was sent to the requesting provider on this date.  Electronically Signed: Norleen Roulette 07/14/2023, 11:32 AM   I spent a total of 5 Minutes in face to face in clinical consultation, greater than 50% of which was counseling/coordinating care for post [facility] embolization and ablation.

## 2023-07-25 DIAGNOSIS — M79675 Pain in left toe(s): Secondary | ICD-10-CM | POA: Diagnosis not present

## 2023-07-25 DIAGNOSIS — M79672 Pain in left foot: Secondary | ICD-10-CM | POA: Diagnosis not present

## 2023-07-25 DIAGNOSIS — I10 Essential (primary) hypertension: Secondary | ICD-10-CM | POA: Diagnosis not present

## 2023-07-25 DIAGNOSIS — Z1389 Encounter for screening for other disorder: Secondary | ICD-10-CM | POA: Diagnosis not present

## 2023-07-25 DIAGNOSIS — N1832 Chronic kidney disease, stage 3b: Secondary | ICD-10-CM | POA: Diagnosis not present

## 2023-07-25 DIAGNOSIS — L565 Disseminated superficial actinic porokeratosis (DSAP): Secondary | ICD-10-CM | POA: Diagnosis not present

## 2023-07-25 DIAGNOSIS — J439 Emphysema, unspecified: Secondary | ICD-10-CM | POA: Diagnosis not present

## 2023-07-25 DIAGNOSIS — M79674 Pain in right toe(s): Secondary | ICD-10-CM | POA: Diagnosis not present

## 2023-07-25 DIAGNOSIS — Z0001 Encounter for general adult medical examination with abnormal findings: Secondary | ICD-10-CM | POA: Diagnosis not present

## 2023-07-25 DIAGNOSIS — M79671 Pain in right foot: Secondary | ICD-10-CM | POA: Diagnosis not present

## 2023-07-25 DIAGNOSIS — B353 Tinea pedis: Secondary | ICD-10-CM | POA: Diagnosis not present

## 2023-07-25 DIAGNOSIS — I700 Atherosclerosis of aorta: Secondary | ICD-10-CM | POA: Diagnosis not present

## 2023-07-25 DIAGNOSIS — I714 Abdominal aortic aneurysm, without rupture, unspecified: Secondary | ICD-10-CM | POA: Diagnosis not present

## 2023-07-25 DIAGNOSIS — L110 Acquired keratosis follicularis: Secondary | ICD-10-CM | POA: Diagnosis not present

## 2023-07-25 DIAGNOSIS — G8929 Other chronic pain: Secondary | ICD-10-CM | POA: Diagnosis not present

## 2023-07-31 DIAGNOSIS — N1832 Chronic kidney disease, stage 3b: Secondary | ICD-10-CM | POA: Diagnosis not present

## 2023-07-31 DIAGNOSIS — Z0001 Encounter for general adult medical examination with abnormal findings: Secondary | ICD-10-CM | POA: Diagnosis not present

## 2023-07-31 DIAGNOSIS — I10 Essential (primary) hypertension: Secondary | ICD-10-CM | POA: Diagnosis not present

## 2023-08-22 DIAGNOSIS — N1832 Chronic kidney disease, stage 3b: Secondary | ICD-10-CM | POA: Diagnosis not present

## 2023-08-22 DIAGNOSIS — I10 Essential (primary) hypertension: Secondary | ICD-10-CM | POA: Diagnosis not present

## 2023-09-22 DIAGNOSIS — N1832 Chronic kidney disease, stage 3b: Secondary | ICD-10-CM | POA: Diagnosis not present

## 2023-09-22 DIAGNOSIS — I10 Essential (primary) hypertension: Secondary | ICD-10-CM | POA: Diagnosis not present

## 2023-10-05 DIAGNOSIS — I10 Essential (primary) hypertension: Secondary | ICD-10-CM | POA: Diagnosis not present

## 2023-10-05 DIAGNOSIS — N1832 Chronic kidney disease, stage 3b: Secondary | ICD-10-CM | POA: Diagnosis not present

## 2023-10-05 DIAGNOSIS — Z0001 Encounter for general adult medical examination with abnormal findings: Secondary | ICD-10-CM | POA: Diagnosis not present

## 2023-10-05 DIAGNOSIS — G8929 Other chronic pain: Secondary | ICD-10-CM | POA: Diagnosis not present

## 2023-10-05 DIAGNOSIS — C220 Liver cell carcinoma: Secondary | ICD-10-CM | POA: Diagnosis not present

## 2023-10-13 ENCOUNTER — Encounter (INDEPENDENT_AMBULATORY_CARE_PROVIDER_SITE_OTHER): Payer: Self-pay | Admitting: Otolaryngology

## 2023-10-13 DIAGNOSIS — N1831 Chronic kidney disease, stage 3a: Secondary | ICD-10-CM | POA: Diagnosis not present

## 2023-10-13 DIAGNOSIS — E211 Secondary hyperparathyroidism, not elsewhere classified: Secondary | ICD-10-CM | POA: Diagnosis not present

## 2023-10-13 DIAGNOSIS — N189 Chronic kidney disease, unspecified: Secondary | ICD-10-CM | POA: Diagnosis not present

## 2023-10-13 DIAGNOSIS — R809 Proteinuria, unspecified: Secondary | ICD-10-CM | POA: Diagnosis not present

## 2023-10-13 DIAGNOSIS — D631 Anemia in chronic kidney disease: Secondary | ICD-10-CM | POA: Diagnosis not present

## 2023-10-20 DIAGNOSIS — E875 Hyperkalemia: Secondary | ICD-10-CM | POA: Diagnosis not present

## 2023-10-20 DIAGNOSIS — I129 Hypertensive chronic kidney disease with stage 1 through stage 4 chronic kidney disease, or unspecified chronic kidney disease: Secondary | ICD-10-CM | POA: Diagnosis not present

## 2023-10-20 DIAGNOSIS — D638 Anemia in other chronic diseases classified elsewhere: Secondary | ICD-10-CM | POA: Diagnosis not present

## 2023-10-20 DIAGNOSIS — N2581 Secondary hyperparathyroidism of renal origin: Secondary | ICD-10-CM | POA: Diagnosis not present

## 2023-10-22 DIAGNOSIS — I10 Essential (primary) hypertension: Secondary | ICD-10-CM | POA: Diagnosis not present

## 2023-10-22 DIAGNOSIS — N1832 Chronic kidney disease, stage 3b: Secondary | ICD-10-CM | POA: Diagnosis not present

## 2023-10-24 DIAGNOSIS — M79672 Pain in left foot: Secondary | ICD-10-CM | POA: Diagnosis not present

## 2023-10-24 DIAGNOSIS — M79674 Pain in right toe(s): Secondary | ICD-10-CM | POA: Diagnosis not present

## 2023-10-24 DIAGNOSIS — M79671 Pain in right foot: Secondary | ICD-10-CM | POA: Diagnosis not present

## 2023-10-24 DIAGNOSIS — L110 Acquired keratosis follicularis: Secondary | ICD-10-CM | POA: Diagnosis not present

## 2023-10-24 DIAGNOSIS — I739 Peripheral vascular disease, unspecified: Secondary | ICD-10-CM | POA: Diagnosis not present

## 2023-10-24 DIAGNOSIS — M25774 Osteophyte, right foot: Secondary | ICD-10-CM | POA: Diagnosis not present

## 2023-10-24 DIAGNOSIS — M79675 Pain in left toe(s): Secondary | ICD-10-CM | POA: Diagnosis not present

## 2023-11-20 ENCOUNTER — Other Ambulatory Visit: Payer: Self-pay | Admitting: Nurse Practitioner

## 2023-11-20 DIAGNOSIS — C220 Liver cell carcinoma: Secondary | ICD-10-CM

## 2023-11-20 DIAGNOSIS — K7469 Other cirrhosis of liver: Secondary | ICD-10-CM | POA: Diagnosis not present

## 2023-11-20 NOTE — Progress Notes (Signed)
 [facility] [place]  Patient: Steven [name] Age: 73 y.o.        DOB: 10-Nov-1950   MRN: [id number] Sex: male      DATE:  11/20/23    Referring Provider:   Sonny Kerns, PA [facility] [address] [place], [place] [address] Phone [phone] Fax [phone]   Primary Care Provider:  Benita Outhouse, MD [phone]   [place], [place] [address]   [phone] (Work)   [phone] (Fax)  Interventional Radiology: Adele Rush, MD   [address]   [place], [place] [address]   Phone: [phone]   Fax: [phone]    Nephrology: Rachele Gaynell RAMAN, MD   [facility]  [address]  [facility]  [place], [place] [address]  [phone] Fax [phone] Subjective   Steven [name] is a 73 y.o. White or Caucasian male who is alone and serves as his own historian.  Steven [name] was last seen by me at [facility] [place] on 05/08/23 and is here today for follow up.  Patient with cirrhosis secondary to HCV complicated by hepatocellular carcinoma.   Patient has a history of hepatitis C status posttreatment with sustained virologic response and cure with Harvoni in 2018.   He had an ultrasound with elastography in 2018 with a liver stiffness of 1.35m/sec corresponding with F2-F3 fibrosis.     He had an ultrasound of the abdomen in April 2022 that noticed dilatation of the common bile duct, hepatic steatosis, and no focal liver lesions.  Patient subsequently underwent MRI/MRCP in July 2022 which noted a 1 cm segment 8 lesion with washout, arterial phase enhancement, and pseudocapsule reported as an LR-4 lesion assuming patient is cirrhotic.  AFP was within normal limits.  Patient underwent MRI with and without contrast for further evaluation of his segment 8 lesion in November 2022.  MRI identified growth of this lesion to 2 cm consistent with an LR 5  lesion assuming patient has cirrhosis.  He underwent microwave ablation of the lesion on 08/18/2021.  Post ablation course was complicated by polymicrobial GNR sepsis.    Most recent MRI with decrease in size of segment VIII treated HCC with post treatment changes.  Patient has evidence of immunity hepatitis A but no immunity to hepatitis B.  No previous serologic evaluation for autoimmune or genetic liver disease is available.   Most recent labs with liver chemistries within normal limits and no thrombocytopenia.     Patient had a positive Cologuard in 2019 but declined colonoscopy at that time.    He has since undergone colonoscopy with no malignancy identified.  Interim history: History of Present Illness The patient is a 73 year old male who presents today for follow-up of cirrhosis with a history of hepatocellular carcinoma, post microablation.  He reports a stable condition since his last visit in December 2024, with no hospital or emergency room admissions. He underwent an MRI in January 2025, which revealed a stable hepatic lesion. He is not experiencing any hematemesis, melena, cognitive issues such as confusion or concentration difficulties, or abdominal pain. He has not had any surgical interventions since his last visit. He also reports no peripheral edema. He has a history of upper endoscopy and colonoscopy, performed approximately 20 to 30 years ago due to a bleeding ulcer. He has discontinued gabapentin . He has no history of ascites.       Social history:  denies a history of injectable or intranasal drug use, tattoos, high risk sexual behavior, Financial planner.  He does have a history of a blood transfusion when he was 73 years old and was previously incarcerated in the early 2000's.  He has a history of heavy alcohol use and was drinking 6-7 beers per day until approximately 6 months ago and reports that his last alcohol use was approximately April 2022.  He is a former smoker  and works remodeling homes.      Patient Active Problem List  Diagnosis  . Stage 3b chronic kidney disease (CMD)  . Positive colorectal cancer screening using Cologuard test  . Osteoarthritis  . Opioid dependence    (CMD)  . Other cirrhosis of liver    (CMD)  . Essential hypertension  . Dilated bile duct  . Primary hepatocellular carcinoma of liver    (CMD)    Past Surgical History:  Procedure Laterality Date  . RADIOFREQUENCY ABLATION  08/18/2021   MWA righ hepatic lobe  . TONSILLECTOMY      Family History  Problem Relation Name Age of Onset  . No Known Problems Mother    . Black lung disease Father    . No Known Problems Sister    . No Known Problems Brother    . No Known Problems Brother    . Liver cancer Neg Hx    . Liver disease Neg Hx       Review of Systems  Constitutional:  Negative for fatigue, fever and unexpected weight change.  Respiratory:  Negative for cough and shortness of breath.   Cardiovascular:  Negative for chest pain.  Gastrointestinal:  Negative for abdominal distention, abdominal pain, blood in stool and vomiting.  Skin:  Negative for color change.  Neurological:  Negative for tremors and speech difficulty.  Psychiatric/Behavioral:  Negative for confusion, decreased concentration and sleep disturbance.   All other systems reviewed and are negative.    No Known Allergies  Current Outpatient Medications  Medication Instructions  . albuterol HFA (PROVENTIL HFA;VENTOLIN HFA;PROAIR HFA) 90 mcg/actuation inhaler INHALE 1 PUFF FOUR TIMES DAILY AS NEEDED  . aspirin -acetaminophen -caffeine (EXCEDRINE ES) 250-250-65 mg tab per tablet 1 tablet, As needed  . buprenorphine -naloxone  (SUBOXONE ) 8-2 mg per SL film PLACE 1 FILM UNDER THE TONGUE ONCE DAILY  . buPROPion  (WELLBUTRIN  XL) 300 mg, Every morning  . cholecalciferol  (VITAMIN D3) 1,000 Units, Daily  . cloNIDine  (CATAPRES ) 0.1 mg tablet TAKE 1 TABLET BY MOUTH EVERY NIGHT  . diazePAM  (VALIUM ) 10 mg  .  econazole nitrate (SPECTAZOLE) 1 % cream APPLY 1/2 GRAM TO SKIN TWICE DAILY  . esomeprazole (NEXIUM) 20 mg  . furosemide  (LASIX ) 20 mg tablet 0.5 tablets, 2 times daily - Transplant  . gabapentin  (NEURONTIN ) 600 mg, 3 times daily  . triamcinolone (KENALOG) 0.1 % cream triamcinolone acetonide 0.1 % topical cream  APPLY TOPICALLY TO THE AFFECTED AREA TWICE DAILY  . Veltassa 8.4 gram pwpk packet MIX AND TAKE 8.4 GRAMS BY MOUTH TWO TIMES A WEEK    Objective:    Vitals:   11/20/23 1116  BP: 124/79  Pulse: 75  Temp: 99.1 F (37.3 C)  SpO2: 97%    Height: 1.778 m (5' 10) (11/20/2023 11:16 AM)  Weight: 83 kg (183 lb) (11/20/2023 11:16 AM)  Body mass index is 26.26 kg/m.  Physical Exam Vitals reviewed.  Constitutional:      General: He is not in acute distress.    Appearance: Normal appearance.   Eyes:  General: No scleral icterus.    Extraocular Movements: Extraocular movements intact.     Pupils: Pupils are equal, round, and reactive to light.    Cardiovascular:     Rate and Rhythm: Normal rate and regular rhythm.     Heart sounds: No murmur heard. Pulmonary:     Effort: Pulmonary effort is normal. No respiratory distress.     Breath sounds: Normal breath sounds.  Abdominal:     General: There is no distension.     Palpations: Abdomen is soft. There is no shifting dullness, fluid wave, hepatomegaly, splenomegaly or mass.     Tenderness: There is no abdominal tenderness.     Hernia: No hernia is present.   Musculoskeletal:     Right lower leg: No edema.     Left lower leg: No edema.  Lymphadenopathy:     Cervical: No cervical adenopathy.   Skin:    General: Skin is warm and dry.     Coloration: Skin is not jaundiced.   Neurological:     Mental Status: He is alert and oriented to person, place, and time.     Motor: No tremor.     Gait: Gait is intact.     Comments: No asterixis  Psychiatric:        Mood and Affect: Mood normal.        Behavior: Behavior  normal.        Thought Content: Thought content normal.        Judgment: Judgment normal.     LABS:   IMAGING: 07/01/2023 MRI ABDOMEN WITHOUT AND WITH CONTRAST   TECHNIQUE: Multiplanar multisequence MR imaging of the abdomen was performed both before and after the administration of intravenous contrast.   CONTRAST:  8 cc Vueway    COMPARISON:  10/29/2018 for   FINDINGS: Lower chest: No acute findings.   Hepatobiliary: Treated lesion in the anterior right lobe of liver, segment 8 measures 1.9 x 1.3 cm, image 29/13. There is no convincing evidence for recurrent arterial phase enhancement within the treated lesion to suggest recurrent disease.   No new enhancing liver lesions identified.   Anterior to the treated portions of the liver this area there is a small extrahepatic soft tissue density nodule which shows arterial phase enhancement measuring 1.4 x 0.6 cm, image 28/12. Previously this measured the same. This is stable when compared with 06/18/2022 and likely reflects post treatment change. Tumor seeding along the ablation tract is considered less favored. Stone within the dependent portion of the gallbladder measures 1.5 cm, image 23/7. Adenomyomatosis within the gallbladder fundus. Stable appearance of common bile duct measuring 0.9 cm in diameter, image 24/7. No signs of choledocholithiasis.   Pancreas: No mass, inflammatory changes, or other parenchymal abnormality identified.   Spleen:  Within normal limits in size and appearance.   Adrenals/Urinary Tract: Normal adrenal glands. No suspicious kidney mass or hydronephrosis. Hemorrhagic cyst arises off the upper pole of left kidney measuring 8 mm. Small bilateral T2 hyperintense kidney cysts are also noted. No signs of obstructive uropathy.   Stomach/Bowel: Visualized portions within the abdomen are unremarkable.   Vascular/Lymphatic: Stable 3.4 cm infrarenal abdominal aortic aneurysm. No adenopathy.    Other:  No ascites or focal fluid collections identified.   Musculoskeletal: No suspicious bone lesions identified.   IMPRESSION: 1. Stable appearance of treated lesion in the anterior right lobe of liver, segment 8. No convincing evidence for recurrent disease. Continued interval surveillance is advised. 2. Stable appearance of small  extrahepatic soft tissue density nodule anterior to the treated portions of the liver. This is stable when compared with 06/18/2022 and likely reflects post treatment change. Tumor seeding along the ablation tract is considered less favored. 3. Cholelithiasis. 4. Stable 3.4 cm infrarenal abdominal aortic aneurysm. Recommend follow-up ultrasound every 3 years. (Ref.: J Vasc Surg. 2018; 67:2-77 and J Am Coll Radiol 2013;10(10):789-794.)    Assessment / Plan:   Problem List Items Addressed This Visit     Other cirrhosis of liver    (CMD)   Patient was previously noted to have F2-F3 fibrosis by elastography in 2018.  It is possible that he has had progressive fibrosis despite treatment of hepatitis C due to ongoing alcohol use, or that elastography underestimated degree of fibrosis at original staging.  Although imaging reports no morphologic features of cirrhosis it would be unusual to see hepatocellular carcinoma in a noncirrhotic liver.  It is also possible that if his initial elastography under staged fibrosis and he has since had some degree of fibrosis regression since cure of his hepatitis C, that he may not have a morphologic appearance of cirrhosis to the liver.  He has no splenomegaly and no thrombocytopenia so I do not think we need to pursue EGD urgently.  Reviewed signs and symptoms of hepatic decompensation including ascites, encephalopathy, and GI bleeding and indications to contact our office or go to the emergency room.      Relevant Orders   Alpha-Fetoprotein (AFP), Tumor Marker (Completed)   Prothrombin Time (PT) with INR (Completed)    Liver Function Panel (Completed)   CBC without Differential (Completed)   Basic Metabolic Panel (Completed)   MRI Abdomen W And WO Contrast   Primary hepatocellular carcinoma of liver    (CMD) - Primary   Patient with 2.7 cm HCC in segment 8 previously categorized as an LR 5 lesion on MRI.  Lesion did have interval growth from MRI in November 2022 to recent CT scan and January 2023 (2 cm increased to 2.7 cm).   Patient underwent microwave ablation on 08/18/2021 with curative intent.    He currently has no other indication for liver transplant.  BCLC stage A Index MRI 04/2021: Segment VIII, 2cm LR-5  Triple phase CT 06/24/2021: Segment VIII, 2.7cm Microwave ablation 08/18/2021 MRI 10/29/2022: 2.0 cm ablation defect in segment 8, no recurrence, no other suspicious lesions.  MRI 07/01/2023: 1.9 cm ablation defect segment 8, no recurrence. Small 1.4 cm exophytic are stable compared to 06/2022 imaging.   Will order MRI for restaging,  Patient aware to contact IR to schedule follow up once he knows when MRI is scheduled.       Relevant Orders   Alpha-Fetoprotein (AFP), Tumor Marker (Completed)   Prothrombin Time (PT) with INR (Completed)   Liver Function Panel (Completed)   CBC without Differential (Completed)   Basic Metabolic Panel (Completed)   MRI Abdomen W And WO Contrast   PLAN: Labs MRI  Paitent to contact IR to schedule follow up Follow up in 6 month(s)   Stephane Quest, MSN, NP, A-GPCNP-BC Nurse Practitioner  [facility] [place] Office: [phone]  Fax: [phone]   [facility]  [facility]  Mailing: [address], [place], [place] [address]

## 2023-11-22 ENCOUNTER — Other Ambulatory Visit: Payer: Self-pay | Admitting: Physician Assistant

## 2023-11-22 DIAGNOSIS — I10 Essential (primary) hypertension: Secondary | ICD-10-CM | POA: Diagnosis not present

## 2023-11-22 DIAGNOSIS — N1832 Chronic kidney disease, stage 3b: Secondary | ICD-10-CM | POA: Diagnosis not present

## 2023-11-22 MED ORDER — LORAZEPAM 2 MG PO TABS: 2.0000 mg | ORAL_TABLET | ORAL | 0 refills | Status: AC

## 2023-11-23 DIAGNOSIS — C220 Liver cell carcinoma: Secondary | ICD-10-CM | POA: Diagnosis not present

## 2023-11-23 DIAGNOSIS — K7469 Other cirrhosis of liver: Secondary | ICD-10-CM | POA: Diagnosis not present

## 2023-12-22 DIAGNOSIS — I10 Essential (primary) hypertension: Secondary | ICD-10-CM | POA: Diagnosis not present

## 2023-12-22 DIAGNOSIS — N1832 Chronic kidney disease, stage 3b: Secondary | ICD-10-CM | POA: Diagnosis not present

## 2023-12-24 ENCOUNTER — Ambulatory Visit
Admission: RE | Admit: 2023-12-24 | Discharge: 2023-12-24 | Disposition: A | Discharge status: Home / Self Care | Source: Ambulatory Visit | Attending: Nurse Practitioner

## 2023-12-24 DIAGNOSIS — I7143 Infrarenal abdominal aortic aneurysm, without rupture: Secondary | ICD-10-CM | POA: Diagnosis not present

## 2023-12-24 DIAGNOSIS — C220 Liver cell carcinoma: Secondary | ICD-10-CM

## 2023-12-24 DIAGNOSIS — K7469 Other cirrhosis of liver: Secondary | ICD-10-CM

## 2023-12-24 DIAGNOSIS — Z9889 Other specified postprocedural states: Secondary | ICD-10-CM | POA: Diagnosis not present

## 2023-12-24 DIAGNOSIS — K8020 Calculus of gallbladder without cholecystitis without obstruction: Secondary | ICD-10-CM | POA: Diagnosis not present

## 2023-12-24 MED ORDER — GADOPICLENOL 0.5 MMOL/ML IV SOLN
8.0000 mL | Freq: Once | INTRAVENOUS | Status: AC | PRN
  Administered 2023-12-24: 8 mL via INTRAVENOUS

## 2024-01-09 ENCOUNTER — Institutional Professional Consult (permissible substitution) (INDEPENDENT_AMBULATORY_CARE_PROVIDER_SITE_OTHER): Admitting: Otolaryngology

## 2024-01-09 ENCOUNTER — Ambulatory Visit (INDEPENDENT_AMBULATORY_CARE_PROVIDER_SITE_OTHER): Admitting: Audiology

## 2024-01-18 DIAGNOSIS — I714 Abdominal aortic aneurysm, without rupture, unspecified: Secondary | ICD-10-CM | POA: Diagnosis not present

## 2024-01-18 DIAGNOSIS — I10 Essential (primary) hypertension: Secondary | ICD-10-CM | POA: Diagnosis not present

## 2024-01-18 DIAGNOSIS — N1832 Chronic kidney disease, stage 3b: Secondary | ICD-10-CM | POA: Diagnosis not present

## 2024-01-18 DIAGNOSIS — K7469 Other cirrhosis of liver: Secondary | ICD-10-CM | POA: Diagnosis not present

## 2024-02-06 DIAGNOSIS — S93331D Other subluxation of right foot, subsequent encounter: Secondary | ICD-10-CM | POA: Diagnosis not present

## 2024-02-06 DIAGNOSIS — M79674 Pain in right toe(s): Secondary | ICD-10-CM | POA: Diagnosis not present

## 2024-02-06 DIAGNOSIS — M79671 Pain in right foot: Secondary | ICD-10-CM | POA: Diagnosis not present

## 2024-02-06 DIAGNOSIS — L110 Acquired keratosis follicularis: Secondary | ICD-10-CM | POA: Diagnosis not present

## 2024-02-06 DIAGNOSIS — M779 Enthesopathy, unspecified: Secondary | ICD-10-CM | POA: Diagnosis not present

## 2024-02-18 DIAGNOSIS — N1832 Chronic kidney disease, stage 3b: Secondary | ICD-10-CM | POA: Diagnosis not present

## 2024-02-18 DIAGNOSIS — I10 Essential (primary) hypertension: Secondary | ICD-10-CM | POA: Diagnosis not present

## 2024-03-13 ENCOUNTER — Other Ambulatory Visit: Payer: Self-pay | Admitting: Interventional Radiology

## 2024-03-13 DIAGNOSIS — C220 Liver cell carcinoma: Secondary | ICD-10-CM

## 2024-03-14 ENCOUNTER — Ambulatory Visit (INDEPENDENT_AMBULATORY_CARE_PROVIDER_SITE_OTHER): Admitting: Audiology

## 2024-03-14 ENCOUNTER — Institutional Professional Consult (permissible substitution) (INDEPENDENT_AMBULATORY_CARE_PROVIDER_SITE_OTHER): Admitting: Otolaryngology

## 2024-03-19 DIAGNOSIS — N1832 Chronic kidney disease, stage 3b: Secondary | ICD-10-CM | POA: Diagnosis not present

## 2024-03-19 DIAGNOSIS — I10 Essential (primary) hypertension: Secondary | ICD-10-CM | POA: Diagnosis not present

## 2024-03-25 ENCOUNTER — Telehealth (HOSPITAL_COMMUNITY): Payer: Self-pay | Admitting: Student

## 2024-03-25 MED ORDER — DIAZEPAM 10 MG PO TABS: 10.0000 mg | ORAL_TABLET | Freq: Once | ORAL | 0 refills | Status: AC

## 2024-03-25 NOTE — Telephone Encounter (Signed)
 Patient scheduled for MRI Abdomen 04/07/24. Patient has severe anxiety/claustrophobia. 10 mg Valium  x 1 tablet e-prescribed to patient's pharmacy. Patient will take one tablet 30-45 min prior to his MRI study and he must have a driver.   Warren Dais, AGACNP-BC 03/25/2024, 10:21 AM

## 2024-04-07 ENCOUNTER — Ambulatory Visit
Admission: RE | Admit: 2024-04-07 | Discharge: 2024-04-07 | Disposition: A | Source: Ambulatory Visit | Attending: Interventional Radiology

## 2024-04-07 DIAGNOSIS — C220 Liver cell carcinoma: Secondary | ICD-10-CM

## 2024-04-07 MED ORDER — GADOPICLENOL 0.5 MMOL/ML IV SOLN
9.0000 mL | Freq: Once | INTRAVENOUS | Status: AC | PRN
  Administered 2024-04-07: 9 mL via INTRAVENOUS

## 2024-04-09 ENCOUNTER — Encounter (INDEPENDENT_AMBULATORY_CARE_PROVIDER_SITE_OTHER): Payer: Self-pay | Admitting: *Deleted

## 2024-04-15 ENCOUNTER — Ambulatory Visit
Admission: RE | Admit: 2024-04-15 | Discharge: 2024-04-15 | Disposition: A | Discharge status: Home / Self Care | Source: Ambulatory Visit | Attending: Interventional Radiology | Admitting: Interventional Radiology

## 2024-04-15 DIAGNOSIS — C220 Liver cell carcinoma: Secondary | ICD-10-CM

## 2024-04-15 HISTORY — PX: IR RADIOLOGIST EVAL & MGMT: IMG5224

## 2024-04-15 NOTE — Progress Notes (Signed)
 Referring Physician(s): Drazek,Dawn   Chief Complaint: The patient is seen in follow up today s/p post bland embolization and microwave ablation of right lobe liver lesion 08/18/2021.   History of present illness:  Steven [name]. [place], 73 year old male, has a medical history significant for hepatitis C (treated with Harvoni 2018) and alcoholism who was discovered to have a right lobe liver lesion concerning for hepatocellular carcinoma. He was referred to Interventional Radiology and underwent a combined bland hepatic embolization and microwave ablation 08/18/21. The procedure was performed by Dr. Adele and the patient has followed up regularly with surveillance imaging and clinic visits.   He last followed up with Dr. Adele 07/14/23 and reported feeling well. His abdominal MRI 07/01/23 was negative for evidence of residual or recurrent disease. Given the stability of imaging findings for greater than one year Dr. Adele discussed surveillance imaging every 6 months.   An MR abdomen was obtained 04/07/24 and he presents to the clinic today to discuss these results. He has no complaints.  No abdominal pain, jaundice, change in bowels.  He has not followed up with GI/Hepatology in a while.  Past Medical History:  Diagnosis Date   Cancer (HCC)    skin adn liver   EtOH dependence (HCC)    Hepatitis C    successfully eradicated with Harvoni in 2018 with SVR documented   Hypertension    Shoulder impingement 2004    Past Surgical History:  Procedure Laterality Date   COLONOSCOPY WITH PROPOFOL  N/A 05/03/2021   Procedure: COLONOSCOPY WITH PROPOFOL ;  Surgeon: Cindie Carlin POUR, DO;  Location: [facility];  Service: Endoscopy;  Laterality: N/A;  7:30 / ASA III   FRACTURE SURGERY Left    wrist   IR 3D INDEPENDENT WKST  08/18/2021   IR ANGIOGRAM SELECTIVE EACH ADDITIONAL VESSEL  08/18/2021   IR ANGIOGRAM SELECTIVE EACH ADDITIONAL VESSEL  08/18/2021   IR ANGIOGRAM VISCERAL SELECTIVE  08/18/2021   IR  EMBO TUMOR ORGAN ISCHEMIA INFARCT INC GUIDE ROADMAPPING  08/18/2021   IR RADIOLOGIST EVAL & MGMT  05/18/2021   IR RADIOLOGIST EVAL & MGMT  09/20/2021   IR RADIOLOGIST EVAL & MGMT  11/12/2021   IR RADIOLOGIST EVAL & MGMT  03/22/2022   IR US  GUIDE TISSUE ABLATION  08/18/2021   IR US  GUIDE VASC ACCESS LEFT  08/18/2021   POLYPECTOMY  05/03/2021   Procedure: POLYPECTOMY;  Surgeon: Cindie Carlin POUR, DO;  Location: [facility];  Service: Endoscopy;;  ascending,descending   RADIOFREQUENCY ABLATION N/A 08/18/2021   Procedure: MICROWAVE  ABLATION;  Surgeon: Adele Rush, MD;  Location: [facility];  Service: Anesthesiology;  Laterality: N/A;   SHOULDER ACROMIOPLASTY Right 11/20/2012   Procedure: SHOULDER ACROMIOPLASTY;  Surgeon: Lemond Stable, MD;  Location: [facility];  Service: Orthopedics;  Laterality: Right;   SKIN CANCER EXCISION Left 2013   wrist surgery fell off a roof   STOMACH SURGERY     had bleeding ulcers repaired. age 52   TOTAL KNEE ARTHROPLASTY Right 11/04/2015   Procedure: RIGHT TOTAL KNEE ARTHROPLASTY;  Surgeon: Taft FORBES Minerva, MD;  Location: [facility];  Service: Orthopedics;  Laterality: Right;    Allergies: Patient has no known allergies.  Medications: Prior to Admission medications   Medication Sig Start Date End Date Taking? Authorizing Provider  aspirin -acetaminophen -caffeine (EXCEDRIN MIGRAINE) 250-250-65 MG tablet Take 1 tablet by mouth every 6 (six) hours as needed for headache or migraine.    [provider]  buPROPion  (WELLBUTRIN  XL) 300 MG 24 hr  tablet Take 300 mg by mouth every morning. 09/18/20   [provider]  cholecalciferol  (VITAMIN D3) 25 MCG (1000 UNIT) tablet Take 1,000 Units by mouth daily.    [provider]  cloNIDine  (CATAPRES ) 0.1 MG tablet Take 0.1 mg by mouth at bedtime. 07/31/16   [provider]  esomeprazole (NEXIUM) 20 MG capsule Take 20 mg by mouth daily as needed (acid reflux).    [provider]  furosemide  (LASIX )  20 MG tablet Take 10 mg by mouth 2 (two) times daily. 08/04/20   [provider]  gabapentin  (NEURONTIN ) 600 MG tablet Take 600 mg by mouth 3 (three) times daily. 09/18/20   [provider]  lisinopril  (ZESTRIL ) 5 MG tablet Take 5 mg by mouth daily. 08/04/20   [provider]  SUBOXONE  8-2 MG FILM Take 1 Film by mouth daily. 07/25/16   [provider]  triamcinolone cream (KENALOG) 0.1 % Apply 1 application topically daily as needed (rash/itching).    [provider]  VELTASSA 8.4 g packet Take 8.4 g by mouth 2 (two) times a week. Pt states at preop he takes it 2 times per week not on a regular schedule. 03/29/21   [provider]     Family History  Problem Relation Age of Onset   Liver disease Neg Hx    Colon cancer Neg Hx     Social History   Socioeconomic History   Marital status: Divorced    Spouse name: Not on file   Number of children: Not on file   Years of education: Not on file   Highest education level: Not on file  Occupational History   Not on file  Tobacco Use   Smoking status: Every Day    Current packs/day: 1.00    Average packs/day: 1 pack/day for 49.0 years (49.0 ttl pk-yrs)    Types: Cigars, Cigarettes   Smokeless tobacco: Never  Vaping Use   Vaping status: Never Used  Substance and Sexual Activity   Alcohol use: Not Currently    Comment: 09/22/20 stopped drinking beer   Drug use: Yes    Types: Marijuana    Comment: every 3-4 months   Sexual activity: Yes  Other Topics Concern   Not on file  Social History Narrative   Not on file   Social Drivers of Health   Financial Resource Strain: Not on file  Food Insecurity: Low Risk  (04/22/2022)   Received from [facility]   Hunger Vital Sign    Within the past 12 months, you worried that your food would run out before you got money to buy more: Never true    Within the past 12 months, the food you bought just didn't last and you didn't have money to get  more. : Never true  Transportation Needs: Not on file (04/22/2022)  Physical Activity: Not on file  Stress: Not on file  Social Connections: Not on file     Vital Signs: There were no vitals taken for this visit.  Physical Exam Constitutional:      General: He is not in acute distress.    Appearance: Normal appearance.  HENT:     Head: Normocephalic.     Nose: Nose normal.  Eyes:     General: No scleral icterus. Cardiovascular:     Rate and Rhythm: Normal rate and regular rhythm.  Pulmonary:     Effort: No respiratory distress.  Abdominal:     General: There is no  distension.  Musculoskeletal:     Right lower leg: No edema.     Left lower leg: No edema.  Skin:    General: Skin is warm and dry.     Coloration: Skin is not jaundiced.  Neurological:     Mental Status: He is alert and oriented to person, place, and time.     Imaging: MR Abdomen 04/24/21    04/07/24  LIRADS TR-nonviable  Labs:  CBC: No results for input(s): WBC, HGB, HCT, PLT in the last 8760 hours.  COAGS: No results for input(s): INR, APTT in the last 8760 hours.  BMP: No results for input(s): NA, K, CL, CO2, GLUCOSE, BUN, CALCIUM, CREATININE, GFRNONAA, GFRAA in the last 8760 hours.  Invalid input(s): CMP  LIVER FUNCTION TESTS: No results for input(s): BILITOT, AST, ALT, ALKPHOS, PROT, ALBUMIN in the last 8760 hours.  Assessment and Plan: 73 year old male with a history of hepatocellular carcinoma status post combined bland embolization and microwave ablation of a right lobe liver lesion 08/18/21. Follow up imaging continues to demonstrate complete response.  He is doing well overall.  Follow up in 6 months with repeat MRI abdomen.  Ester Sides, MD Pager: [phone]    I spent a total of 25 Minutes in face to face in clinical consultation, greater than 50% of which was counseling/coordinating care for hepatocellular carcinoma.

## 2024-04-29 ENCOUNTER — Ambulatory Visit (INDEPENDENT_AMBULATORY_CARE_PROVIDER_SITE_OTHER): Admitting: Otolaryngology

## 2024-04-29 ENCOUNTER — Ambulatory Visit (INDEPENDENT_AMBULATORY_CARE_PROVIDER_SITE_OTHER): Admitting: Audiology

## 2024-04-29 ENCOUNTER — Encounter (INDEPENDENT_AMBULATORY_CARE_PROVIDER_SITE_OTHER): Payer: Self-pay | Admitting: Otolaryngology

## 2024-04-29 VITALS — BP 142/75 | HR 76 | Temp 97.9°F | Ht 70.0 in | Wt 188.0 lb

## 2024-04-29 DIAGNOSIS — H903 Sensorineural hearing loss, bilateral: Secondary | ICD-10-CM | POA: Diagnosis not present

## 2024-04-29 DIAGNOSIS — H90A32 Mixed conductive and sensorineural hearing loss, unilateral, left ear with restricted hearing on the contralateral side: Secondary | ICD-10-CM | POA: Diagnosis not present

## 2024-04-29 DIAGNOSIS — H9313 Tinnitus, bilateral: Secondary | ICD-10-CM

## 2024-04-29 NOTE — Progress Notes (Signed)
  [address], [address] [place], [place] [address] [phone]  Audiological Evaluation    Name: Steven [name]     DOB:   Sep 23, 1950      MRN:   [id number]                                                                                     Service Date: 04/29/2024     Accompanied by: unaccompanied   Patient comes today after Dr. Karis, ENT sent a referral for a hearing evaluation due to concerns with tinnitus.   Symptoms Yes Details  Hearing loss  []  Maybe worse in one ear  Tinnitus  [x]  Both ears  Ear pain/ infections/pressure  []    Balance problems  []    Noise exposure history  []    Previous ear surgeries  []    Family history of hearing loss  []    Amplification  []    Other  []      Otoscopy: Right ear: Clear external ear canal and notable landmarks visualized on the tympanic membrane. Left ear:  Clear external ear canal and notable landmarks visualized on the tympanic membrane.  Tympanometry: Right ear: Normal external ear canal volume with normal middle ear pressure and low tympanic membrane compliance (Type As). Of note , very broad gradient noted. Left ear: Normal external ear canal volume with no middle ear pressure peak or tympanic membrane compliance (Type B).   Findings are suggestive of abnormal middle ear function, such as middle ear effusion. Results are consistent with the presence of middle ear effusion.   Hearing Evaluation The hearing test results were completed under inserts and re-checked with headphones  and results are deemed to be of good reliability. Test technique:  conventional    Pure tone Audiometry: Right ear- Normal to severe sensorineural hearing loss from 250 Hz - 8000 Hz. Left ear-  Mild to severe mixed hearing loss from 250 Hz - 8000 Hz.  Speech Audiometry: Right ear- Speech Reception Threshold (SRT) was obtained at 20 dBHL. Left ear-Speech Reception Threshold (SRT) was obtained at 40 dBHL.   Word Recognition Score Tested using  NU-6 (recorded) Right ear: 100% was obtained at a presentation level of 70 dBHL with contralateral masking which is deemed as  excellent. Left ear: 100% was obtained at a presentation level of 85 dBHL with contralateral masking which is deemed as  excellent.   Impression: There is a significant difference in pure-tone thresholds between ears.   Recommendations: Follow up with ENT as scheduled for today. Return for a hearing evaluation if concerns with hearing changes arise or per MD recommendation. Repeat audiogram after medical care.   Demetry Bendickson MARIE LEROUX-MARTINEZ, AUD
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
## 2024-04-30 DIAGNOSIS — H9313 Tinnitus, bilateral: Secondary | ICD-10-CM | POA: Insufficient documentation

## 2024-04-30 DIAGNOSIS — H903 Sensorineural hearing loss, bilateral: Secondary | ICD-10-CM | POA: Insufficient documentation

## 2024-05-14 ENCOUNTER — Encounter: Payer: Self-pay | Admitting: Audiology

## 2024-06-07 ENCOUNTER — Telehealth: Payer: Self-pay | Admitting: Gastroenterology

## 2024-06-07 NOTE — Telephone Encounter (Signed)
 Received communication from [facility] that patient cancelled his appt with them and did not wish to reschedule. We last saw patient in 2022. Please send letter encouraging patient to follow up with [facility] for history of cirrhosis/hepatocellular carcinoma. Imperative that he continues to follow with hepatologist.

## 2024-06-10 NOTE — Telephone Encounter (Signed)
 Letter has been sent to patient via [facility].

## 2024-08-28 ENCOUNTER — Ambulatory Visit (HOSPITAL_COMMUNITY)

## 2024-08-28 ENCOUNTER — Ambulatory Visit
